# Patient Record
Sex: Female | Born: 1950 | Race: White | Hispanic: No | Smoking: Former smoker
Health system: Southern US, Community
[De-identification: ages and names within clinical notes are randomized; demographics above are authoritative.]

## PROBLEM LIST (undated history)

## (undated) DIAGNOSIS — J45909 Unspecified asthma, uncomplicated: Secondary | ICD-10-CM

## (undated) DIAGNOSIS — G8929 Other chronic pain: Secondary | ICD-10-CM

## (undated) DIAGNOSIS — M810 Age-related osteoporosis without current pathological fracture: Secondary | ICD-10-CM

## (undated) DIAGNOSIS — Z720 Tobacco use: Secondary | ICD-10-CM

## (undated) DIAGNOSIS — IMO0002 Reserved for concepts with insufficient information to code with codable children: Secondary | ICD-10-CM

## (undated) DIAGNOSIS — M542 Cervicalgia: Secondary | ICD-10-CM

## (undated) DIAGNOSIS — I10 Essential (primary) hypertension: Secondary | ICD-10-CM

## (undated) DIAGNOSIS — E278 Other specified disorders of adrenal gland: Secondary | ICD-10-CM

## (undated) DIAGNOSIS — J449 Chronic obstructive pulmonary disease, unspecified: Secondary | ICD-10-CM

## (undated) DIAGNOSIS — Z9981 Dependence on supplemental oxygen: Secondary | ICD-10-CM

## (undated) DIAGNOSIS — R0600 Dyspnea, unspecified: Secondary | ICD-10-CM

## (undated) DIAGNOSIS — K5010 Crohn's disease of large intestine without complications: Secondary | ICD-10-CM

## (undated) HISTORY — PX: CHOLECYSTECTOMY: SHX55

## (undated) HISTORY — PX: EYE SURGERY: SHX253

## (undated) HISTORY — PX: APPENDECTOMY: SHX54

## (undated) HISTORY — DX: Crohn's disease of large intestine without complications: K50.10

## (undated) HISTORY — DX: Other specified disorders of adrenal gland: E27.8

---

## 2004-01-12 ENCOUNTER — Emergency Department (HOSPITAL_COMMUNITY): Admission: EM | Admit: 2004-01-12 | Discharge: 2004-01-12 | Payer: Self-pay | Admitting: Emergency Medicine

## 2004-01-22 ENCOUNTER — Encounter (HOSPITAL_COMMUNITY): Admission: RE | Admit: 2004-01-22 | Discharge: 2004-02-21 | Payer: Self-pay | Admitting: Orthopaedic Surgery

## 2004-02-11 ENCOUNTER — Ambulatory Visit (HOSPITAL_COMMUNITY): Admission: RE | Admit: 2004-02-11 | Discharge: 2004-02-11 | Payer: Self-pay | Admitting: Orthopaedic Surgery

## 2004-04-12 ENCOUNTER — Ambulatory Visit: Payer: Self-pay | Admitting: Ophthalmology

## 2004-05-24 ENCOUNTER — Ambulatory Visit: Payer: Self-pay | Admitting: Ophthalmology

## 2005-05-05 ENCOUNTER — Emergency Department (HOSPITAL_COMMUNITY): Admission: EM | Admit: 2005-05-05 | Discharge: 2005-05-05 | Payer: Self-pay | Admitting: Emergency Medicine

## 2006-01-09 ENCOUNTER — Emergency Department (HOSPITAL_COMMUNITY): Admission: EM | Admit: 2006-01-09 | Discharge: 2006-01-09 | Payer: Self-pay | Admitting: Emergency Medicine

## 2008-09-27 ENCOUNTER — Emergency Department (HOSPITAL_COMMUNITY): Admission: EM | Admit: 2008-09-27 | Discharge: 2008-09-27 | Payer: Self-pay | Admitting: Emergency Medicine

## 2011-07-13 ENCOUNTER — Observation Stay (HOSPITAL_COMMUNITY)
Admission: EM | Admit: 2011-07-13 | Discharge: 2011-07-14 | Disposition: A | Discharge status: Home / Self Care | Payer: Self-pay | Attending: General Surgery | Admitting: General Surgery

## 2011-07-13 ENCOUNTER — Emergency Department (HOSPITAL_COMMUNITY): Payer: Self-pay

## 2011-07-13 ENCOUNTER — Encounter (HOSPITAL_COMMUNITY): Payer: Self-pay | Admitting: *Deleted

## 2011-07-13 DIAGNOSIS — N9489 Other specified conditions associated with female genital organs and menstrual cycle: Secondary | ICD-10-CM | POA: Insufficient documentation

## 2011-07-13 DIAGNOSIS — R220 Localized swelling, mass and lump, head: Secondary | ICD-10-CM | POA: Insufficient documentation

## 2011-07-13 DIAGNOSIS — K449 Diaphragmatic hernia without obstruction or gangrene: Secondary | ICD-10-CM | POA: Insufficient documentation

## 2011-07-13 DIAGNOSIS — R05 Cough: Secondary | ICD-10-CM | POA: Insufficient documentation

## 2011-07-13 DIAGNOSIS — I700 Atherosclerosis of aorta: Secondary | ICD-10-CM | POA: Insufficient documentation

## 2011-07-13 DIAGNOSIS — R0602 Shortness of breath: Secondary | ICD-10-CM | POA: Insufficient documentation

## 2011-07-13 DIAGNOSIS — R110 Nausea: Secondary | ICD-10-CM | POA: Insufficient documentation

## 2011-07-13 DIAGNOSIS — J4489 Other specified chronic obstructive pulmonary disease: Secondary | ICD-10-CM | POA: Insufficient documentation

## 2011-07-13 DIAGNOSIS — R059 Cough, unspecified: Secondary | ICD-10-CM | POA: Insufficient documentation

## 2011-07-13 DIAGNOSIS — E278 Other specified disorders of adrenal gland: Secondary | ICD-10-CM | POA: Insufficient documentation

## 2011-07-13 DIAGNOSIS — Z01812 Encounter for preprocedural laboratory examination: Secondary | ICD-10-CM | POA: Insufficient documentation

## 2011-07-13 DIAGNOSIS — J449 Chronic obstructive pulmonary disease, unspecified: Secondary | ICD-10-CM | POA: Insufficient documentation

## 2011-07-13 DIAGNOSIS — K3580 Unspecified acute appendicitis: Principal | ICD-10-CM | POA: Insufficient documentation

## 2011-07-13 DIAGNOSIS — Z79899 Other long term (current) drug therapy: Secondary | ICD-10-CM | POA: Insufficient documentation

## 2011-07-13 DIAGNOSIS — R1031 Right lower quadrant pain: Secondary | ICD-10-CM | POA: Insufficient documentation

## 2011-07-13 DIAGNOSIS — R509 Fever, unspecified: Secondary | ICD-10-CM | POA: Insufficient documentation

## 2011-07-13 HISTORY — DX: Chronic obstructive pulmonary disease, unspecified: J44.9

## 2011-07-13 LAB — DIFFERENTIAL
Basophils Relative: 0 % (ref 0–1)
Monocytes Absolute: 1.4 10*3/uL — ABNORMAL HIGH (ref 0.1–1.0)
Neutro Abs: 13.9 10*3/uL — ABNORMAL HIGH (ref 1.7–7.7)
Neutrophils Relative %: 82 % — ABNORMAL HIGH (ref 43–77)

## 2011-07-13 LAB — CBC
MCH: 29.9 pg (ref 26.0–34.0)
MCHC: 34.5 g/dL (ref 30.0–36.0)
MCV: 86.6 fL (ref 78.0–100.0)
Platelets: 379 10*3/uL (ref 150–400)
RBC: 5.52 MIL/uL — ABNORMAL HIGH (ref 3.87–5.11)
RDW: 13.2 % (ref 11.5–15.5)
WBC: 17 10*3/uL — ABNORMAL HIGH (ref 4.0–10.5)

## 2011-07-13 LAB — COMPREHENSIVE METABOLIC PANEL
ALT: 11 U/L (ref 0–35)
AST: 14 U/L (ref 0–37)
Albumin: 4.6 g/dL (ref 3.5–5.2)
Alkaline Phosphatase: 113 U/L (ref 39–117)
Chloride: 99 mEq/L (ref 96–112)
GFR calc Af Amer: >90 mL/min (ref >90)
GFR calc non Af Amer: >90 mL/min (ref >90)
Total Bilirubin: 0.9 mg/dL (ref 0.3–1.2)
Total Protein: 8.4 g/dL — ABNORMAL HIGH (ref 6.0–8.3)

## 2011-07-13 LAB — URINE MICROSCOPIC-ADD ON

## 2011-07-13 LAB — URINALYSIS, ROUTINE W REFLEX MICROSCOPIC
Ketones, ur: NEGATIVE mg/dL
Leukocytes, UA: NEGATIVE
Nitrite: NEGATIVE
Urobilinogen, UA: 0.2 mg/dL (ref 0.0–1.0)

## 2011-07-13 LAB — LIPASE, BLOOD: Lipase: 18 U/L (ref 11–59)

## 2011-07-13 MED ORDER — HYDROMORPHONE HCL PF 1 MG/ML IJ SOLN
1.0000 mg | INTRAMUSCULAR | Status: DC | PRN
  Administered 2011-07-14: 1 mg via INTRAVENOUS
  Filled 2011-07-13: qty 1

## 2011-07-13 MED ORDER — PANTOPRAZOLE SODIUM 40 MG IV SOLR: 40.0000 mg | Freq: Every day | INTRAVENOUS | Status: DC

## 2011-07-13 MED ORDER — LACTATED RINGERS IV SOLN
INTRAVENOUS | Status: DC
  Administered 2011-07-14: 01:00:00 via INTRAVENOUS

## 2011-07-13 MED ORDER — FENTANYL CITRATE 0.05 MG/ML IJ SOLN
50.0000 ug | Freq: Once | INTRAMUSCULAR | Status: AC
  Administered 2011-07-13: 50 ug via INTRAVENOUS
  Filled 2011-07-13: qty 2

## 2011-07-13 MED ORDER — IOHEXOL 300 MG/ML  SOLN
100.0000 mL | Freq: Once | INTRAMUSCULAR | Status: AC | PRN
  Administered 2011-07-13: 100 mL via INTRAVENOUS

## 2011-07-13 MED ORDER — ERTAPENEM SODIUM 1 G IJ SOLR
1.0000 g | Freq: Once | INTRAMUSCULAR | Status: AC
  Administered 2011-07-13: 1 g via INTRAVENOUS
  Filled 2011-07-13: qty 1

## 2011-07-13 MED ORDER — ONDANSETRON HCL 4 MG/2ML IJ SOLN
4.0000 mg | Freq: Four times a day (QID) | INTRAMUSCULAR | Status: DC | PRN
  Administered 2011-07-13: 4 mg via INTRAVENOUS
  Filled 2011-07-13: qty 2

## 2011-07-13 MED ORDER — SODIUM CHLORIDE 0.9 % IV BOLUS (SEPSIS): 500.0000 mL | Freq: Once | INTRAVENOUS | Status: DC

## 2011-07-13 MED ORDER — ONDANSETRON HCL 4 MG/2ML IJ SOLN
4.0000 mg | Freq: Once | INTRAMUSCULAR | Status: AC
  Administered 2011-07-13: 4 mg via INTRAVENOUS
  Filled 2011-07-13: qty 2

## 2011-07-13 MED ORDER — IOHEXOL 300 MG/ML  SOLN
40.0000 mL | Freq: Once | INTRAMUSCULAR | Status: AC | PRN
  Administered 2011-07-13: 40 mL via ORAL

## 2011-07-13 MED ORDER — SODIUM CHLORIDE 0.9 % IV BOLUS (SEPSIS)
500.0000 mL | Freq: Once | INTRAVENOUS | Status: AC
  Administered 2011-07-13: 500 mL via INTRAVENOUS

## 2011-07-13 MED ORDER — ENOXAPARIN SODIUM 40 MG/0.4ML ~~LOC~~ SOLN: 40.0000 mg | SUBCUTANEOUS | Status: DC

## 2011-07-13 MED ORDER — SODIUM CHLORIDE 0.9 % IV SOLN
INTRAVENOUS | Status: DC
  Administered 2011-07-13: 22:00:00 via INTRAVENOUS

## 2011-07-13 NOTE — ED Notes (Signed)
NO vomiting or diarrhea, abd pain

## 2011-07-13 NOTE — ED Notes (Signed)
Pt medicated as ordered for pain and nausea.

## 2011-07-13 NOTE — ED Notes (Signed)
Pt to CT via stretcher

## 2011-07-13 NOTE — ED Provider Notes (Addendum)
History     CSN: [id number]  Arrival date & time 07/13/11  1919   First MD Initiated Contact with Patient 07/13/11 1929      Chief Complaint  Patient presents with  . Abdominal Pain    (Consider location/radiation/quality/duration/timing/severity/associated sxs/prior treatment) Patient is a 61 y.o. female presenting with abdominal pain. The history is provided by the patient.  Abdominal Pain The primary symptoms of the illness include abdominal pain. The primary symptoms of the illness do not include shortness of breath, nausea, vomiting or diarrhea.  Symptoms associated with the illness do not include back pain.   patient's had abdominal pain for the last few days. Started sort of diffuse left lower quadrant now is more severe in the right lower quadrant. No fevers. No nausea vomiting or diarrhea. No previous surgeries. No change in the pain with bowel movements. No blood in stool. No dysuria. She's not had pains at this before. She had a half a piece of bread at around 4 PM today  Past Medical History  Diagnosis Date  . Diverticula of intestine   . COPD (chronic obstructive pulmonary disease)     History reviewed. No pertinent past surgical history.  History reviewed. No pertinent family history.  History  Substance Use Topics  . Smoking status: Current Everyday Smoker  . Smokeless tobacco: Not on file  . Alcohol Use: No    OB History    Grav Para Term Preterm Abortions TAB SAB Ect Mult Living                  Review of Systems  Constitutional: Negative for activity change and appetite change.  HENT: Negative for neck stiffness.   Eyes: Negative for pain.  Respiratory: Negative for chest tightness and shortness of breath.   Cardiovascular: Negative for chest pain and leg swelling.  Gastrointestinal: Positive for abdominal pain. Negative for nausea, vomiting and diarrhea.  Genitourinary: Negative for flank pain.  Musculoskeletal: Negative for back pain.  Skin:  Negative for rash.  Neurological: Negative for weakness, numbness and headaches.  Psychiatric/Behavioral: Negative for behavioral problems.    Allergies  Sulfa antibiotics  Home Medications   Current Outpatient Rx  Name Route Sig Dispense Refill  . GOODY HEADACHE PO Oral Take 1.5 packets by mouth as needed. For pain      BP 142/81  Pulse 102  Temp(Src) 98.1 F (36.7 C) (Oral)  Resp 20  Ht 5' 2"  (1.575 m)  Wt 105 lb (47.628 kg)  BMI 19.20 kg/m2  SpO2 94%  Physical Exam  Nursing note and vitals reviewed. Constitutional: She is oriented to person, place, and time. She appears well-developed and well-nourished.  HENT:  Head: Normocephalic and atraumatic.  Eyes: EOM are normal. Pupils are equal, round, and reactive to light.  Neck: Normal range of motion. Neck supple.  Cardiovascular: Regular rhythm and normal heart sounds.   No murmur heard.      Tachycardic  Pulmonary/Chest: Effort normal and breath sounds normal. No respiratory distress. She has no wheezes. She has no rales.  Abdominal: Soft. Bowel sounds are normal. She exhibits no distension. There is tenderness. There is guarding. There is no rebound.       Moderate right lower quadrant tenderness with some guarding. No hernias palpated  Musculoskeletal: Normal range of motion.  Neurological: She is alert and oriented to person, place, and time. No cranial nerve deficit.  Skin: Skin is warm and dry.  Psychiatric: She has a normal mood and affect.  Her speech is normal.    ED Course  Procedures (including critical care time)  Labs Reviewed  CBC - Abnormal; Notable for the following:    WBC 17.0 (*)    RBC 5.52 (*)    Hemoglobin 16.5 (*)    HCT 47.8 (*)    All other components within normal limits  DIFFERENTIAL - Abnormal; Notable for the following:    Neutrophils Relative 82 (*)    Neutro Abs 13.9 (*)    Lymphocytes Relative 10 (*)    Monocytes Absolute 1.4 (*)    All other components within normal limits    COMPREHENSIVE METABOLIC PANEL - Abnormal; Notable for the following:    Glucose, Bld 127 (*)    Calcium 10.8 (*)    Total Protein 8.4 (*)    All other components within normal limits  URINALYSIS, ROUTINE W REFLEX MICROSCOPIC - Abnormal; Notable for the following:    Specific Gravity, Urine <1.005 (*)    Hgb urine dipstick TRACE (*)    All other components within normal limits  URINE MICROSCOPIC-ADD ON - Abnormal; Notable for the following:    Squamous Epithelial / LPF FEW (*)    Casts HYALINE CASTS (*)    All other components within normal limits  LIPASE, BLOOD   No results found.   No diagnosis found.    MDM  Severe right lower quadrant abdominal pain. History of diverticulosis. Patient has not had an appendectomy. She does have a leukocytosis of 17. Urinalysis does not show infection. CT scan is pending at this time.        Jasper Riling. Alvino Chapel, MD 07/13/11 2125  Ct showed acute appy. Also thickening on cecum. Also Left adrenal mass will need to be followed. Will be admitted.  Jasper Riling. Alvino Chapel, MD 07/13/11 2230

## 2011-07-14 ENCOUNTER — Encounter (HOSPITAL_COMMUNITY): Payer: Self-pay | Admitting: Anesthesiology

## 2011-07-14 ENCOUNTER — Inpatient Hospital Stay (HOSPITAL_COMMUNITY): Payer: Self-pay | Admitting: Anesthesiology

## 2011-07-14 ENCOUNTER — Encounter (HOSPITAL_COMMUNITY): Payer: Self-pay | Admitting: *Deleted

## 2011-07-14 ENCOUNTER — Other Ambulatory Visit: Payer: Self-pay | Admitting: General Surgery

## 2011-07-14 ENCOUNTER — Encounter (HOSPITAL_COMMUNITY)
Admission: EM | Disposition: A | Discharge status: Home / Self Care | Payer: Self-pay | Source: Home / Self Care | Attending: Emergency Medicine

## 2011-07-14 HISTORY — PX: LAPAROSCOPIC APPENDECTOMY: SHX408

## 2011-07-14 LAB — CBC
MCHC: 33.3 g/dL (ref 30.0–36.0)
Platelets: 304 10*3/uL (ref 150–400)
RDW: 13.3 % (ref 11.5–15.5)
WBC: 13.9 10*3/uL — ABNORMAL HIGH (ref 4.0–10.5)

## 2011-07-14 LAB — BASIC METABOLIC PANEL
BUN: 5 mg/dL — ABNORMAL LOW (ref 6–23)
CO2: 25 mEq/L (ref 19–32)
Calcium: 9 mg/dL (ref 8.4–10.5)
Chloride: 105 mEq/L (ref 96–112)
Creatinine, Ser: 0.54 mg/dL (ref 0.50–1.10)
Glucose, Bld: 122 mg/dL — ABNORMAL HIGH (ref 70–99)
Potassium: 3.6 mEq/L (ref 3.5–5.1)

## 2011-07-14 LAB — SURGICAL PCR SCREEN: Staphylococcus aureus: NEGATIVE

## 2011-07-14 SURGERY — APPENDECTOMY, LAPAROSCOPIC
Anesthesia: General | Wound class: Contaminated

## 2011-07-14 MED ORDER — FENTANYL CITRATE 0.05 MG/ML IJ SOLN
INTRAMUSCULAR | Status: DC | PRN
  Administered 2011-07-14 (×2): 50 ug via INTRAVENOUS

## 2011-07-14 MED ORDER — NEOSTIGMINE METHYLSULFATE 1 MG/ML IJ SOLN
INTRAMUSCULAR | Status: AC
  Filled 2011-07-14: qty 10

## 2011-07-14 MED ORDER — ONDANSETRON HCL 4 MG/2ML IJ SOLN
INTRAMUSCULAR | Status: AC
  Administered 2011-07-14: 4 mg via INTRAVENOUS
  Filled 2011-07-14: qty 2

## 2011-07-14 MED ORDER — FENTANYL CITRATE 0.05 MG/ML IJ SOLN: 25.0000 ug | INTRAMUSCULAR | Status: DC | PRN

## 2011-07-14 MED ORDER — ROCURONIUM BROMIDE 50 MG/5ML IV SOLN
INTRAVENOUS | Status: AC
  Filled 2011-07-14: qty 1

## 2011-07-14 MED ORDER — GLYCOPYRROLATE 0.2 MG/ML IJ SOLN
INTRAMUSCULAR | Status: AC
  Administered 2011-07-14: 0.2 mg via INTRAVENOUS
  Filled 2011-07-14: qty 1

## 2011-07-14 MED ORDER — SODIUM CHLORIDE 0.9 % IR SOLN
Status: DC | PRN
  Administered 2011-07-14: 1000 mL

## 2011-07-14 MED ORDER — LIDOCAINE HCL 1 % IJ SOLN
INTRAMUSCULAR | Status: DC | PRN
  Administered 2011-07-14: 20 mg via INTRADERMAL

## 2011-07-14 MED ORDER — PHENYLEPHRINE HCL 10 MG/ML IJ SOLN
INTRAMUSCULAR | Status: DC | PRN
  Administered 2011-07-14: 50 ug via INTRAVENOUS

## 2011-07-14 MED ORDER — BUPIVACAINE HCL (PF) 0.5 % IJ SOLN
INTRAMUSCULAR | Status: AC
  Filled 2011-07-14: qty 30

## 2011-07-14 MED ORDER — GLYCOPYRROLATE 0.2 MG/ML IJ SOLN
INTRAMUSCULAR | Status: DC | PRN
  Administered 2011-07-14: .4 mg via INTRAVENOUS

## 2011-07-14 MED ORDER — ONDANSETRON HCL 4 MG/2ML IJ SOLN: 4.0000 mg | Freq: Once | INTRAMUSCULAR | Status: DC | PRN

## 2011-07-14 MED ORDER — ONDANSETRON HCL 4 MG/2ML IJ SOLN
4.0000 mg | Freq: Once | INTRAMUSCULAR | Status: AC
  Administered 2011-07-14: 4 mg via INTRAVENOUS

## 2011-07-14 MED ORDER — PROPOFOL 10 MG/ML IV EMUL
INTRAVENOUS | Status: AC
  Filled 2011-07-14: qty 20

## 2011-07-14 MED ORDER — LIDOCAINE HCL (PF) 1 % IJ SOLN
INTRAMUSCULAR | Status: AC
  Filled 2011-07-14: qty 5

## 2011-07-14 MED ORDER — ACETAMINOPHEN 325 MG PO TABS: 325.0000 mg | ORAL_TABLET | ORAL | Status: DC | PRN

## 2011-07-14 MED ORDER — GLYCOPYRROLATE 0.2 MG/ML IJ SOLN
0.2000 mg | Freq: Once | INTRAMUSCULAR | Status: AC
  Administered 2011-07-14: 0.2 mg via INTRAVENOUS

## 2011-07-14 MED ORDER — ENOXAPARIN SODIUM 40 MG/0.4ML ~~LOC~~ SOLN
SUBCUTANEOUS | Status: AC
  Administered 2011-07-14: 40 mg via SUBCUTANEOUS
  Filled 2011-07-14: qty 0.4

## 2011-07-14 MED ORDER — ENOXAPARIN SODIUM 40 MG/0.4ML ~~LOC~~ SOLN
40.0000 mg | Freq: Once | SUBCUTANEOUS | Status: AC
  Administered 2011-07-14: 40 mg via SUBCUTANEOUS

## 2011-07-14 MED ORDER — NEOSTIGMINE METHYLSULFATE 1 MG/ML IJ SOLN
INTRAMUSCULAR | Status: DC | PRN
  Administered 2011-07-14: 2 mg via INTRAVENOUS

## 2011-07-14 MED ORDER — GLYCOPYRROLATE 0.2 MG/ML IJ SOLN
INTRAMUSCULAR | Status: AC
  Filled 2011-07-14: qty 1

## 2011-07-14 MED ORDER — SODIUM CHLORIDE 0.9 % IV SOLN
1.0000 g | INTRAVENOUS | Status: DC
  Filled 2011-07-14: qty 1

## 2011-07-14 MED ORDER — PROPOFOL 10 MG/ML IV BOLUS
INTRAVENOUS | Status: DC | PRN
  Administered 2011-07-14: 100 mg via INTRAVENOUS

## 2011-07-14 MED ORDER — MIDAZOLAM HCL 2 MG/2ML IJ SOLN
INTRAMUSCULAR | Status: AC
  Administered 2011-07-14: 2 mg via INTRAVENOUS
  Filled 2011-07-14: qty 2

## 2011-07-14 MED ORDER — LACTATED RINGERS IV SOLN: INTRAVENOUS | Status: DC

## 2011-07-14 MED ORDER — HYDROMORPHONE HCL PF 1 MG/ML IJ SOLN: 1.0000 mg | INTRAMUSCULAR | Status: DC | PRN

## 2011-07-14 MED ORDER — HYDROCODONE-ACETAMINOPHEN 5-325 MG PO TABS: 1.0000 | ORAL_TABLET | ORAL | Status: AC | PRN

## 2011-07-14 MED ORDER — ROCURONIUM BROMIDE 100 MG/10ML IV SOLN
INTRAVENOUS | Status: DC | PRN
  Administered 2011-07-14: 5 mg via INTRAVENOUS
  Administered 2011-07-14: 15 mg via INTRAVENOUS

## 2011-07-14 MED ORDER — HYDROCODONE-ACETAMINOPHEN 5-325 MG PO TABS
1.0000 | ORAL_TABLET | ORAL | Status: DC | PRN
  Administered 2011-07-14: 1 via ORAL
  Filled 2011-07-14: qty 1

## 2011-07-14 MED ORDER — MIDAZOLAM HCL 2 MG/2ML IJ SOLN
1.0000 mg | INTRAMUSCULAR | Status: DC | PRN
  Administered 2011-07-14: 2 mg via INTRAVENOUS

## 2011-07-14 MED ORDER — FENTANYL CITRATE 0.05 MG/ML IJ SOLN
INTRAMUSCULAR | Status: AC
  Filled 2011-07-14: qty 5

## 2011-07-14 MED ORDER — SODIUM CHLORIDE 0.9 % IV SOLN
INTRAVENOUS | Status: AC
  Filled 2011-07-14: qty 1

## 2011-07-14 MED ORDER — BUPIVACAINE HCL 0.5 % IJ SOLN
INTRAMUSCULAR | Status: DC | PRN
  Administered 2011-07-14: 10 mL

## 2011-07-14 SURGICAL SUPPLY — 42 items
BAG HAMPER (MISCELLANEOUS) ×2 IMPLANT
BENZOIN TINCTURE PRP APPL 2/3 (GAUZE/BANDAGES/DRESSINGS) ×2 IMPLANT
CLOTH BEACON ORANGE TIMEOUT ST (SAFETY) ×2 IMPLANT
COVER LIGHT HANDLE STERIS (MISCELLANEOUS) ×4 IMPLANT
CUTTER ENDO LINEAR 45M (STAPLE) ×2 IMPLANT
CUTTER LINEAR ENDO 35 ETS TH (STAPLE) ×2 IMPLANT
DECANTER SPIKE VIAL GLASS SM (MISCELLANEOUS) ×2 IMPLANT
DEVICE TROCAR PUNCTURE CLOSURE (ENDOMECHANICALS) ×2 IMPLANT
DISSECTOR BLUNT TIP ENDO 5MM (MISCELLANEOUS) IMPLANT
DURAPREP 26ML APPLICATOR (WOUND CARE) ×2 IMPLANT
ELECT REM PT RETURN 9FT ADLT (ELECTROSURGICAL) ×2
ELECTRODE REM PT RTRN 9FT ADLT (ELECTROSURGICAL) ×1 IMPLANT
FILTER SMOKE EVAC LAPAROSHD (FILTER) ×2 IMPLANT
FORMALIN 10 PREFIL 120ML (MISCELLANEOUS) ×2 IMPLANT
GLOVE BIOGEL PI IND STRL 7.5 (GLOVE) ×1 IMPLANT
GLOVE BIOGEL PI INDICATOR 7.5 (GLOVE) ×1
GLOVE ECLIPSE 6.5 STRL STRAW (GLOVE) ×6 IMPLANT
GLOVE ECLIPSE 7.0 STRL STRAW (GLOVE) ×2 IMPLANT
GLOVE INDICATOR 7.0 STRL GRN (GLOVE) ×6 IMPLANT
GOWN STRL REIN XL XLG (GOWN DISPOSABLE) ×4 IMPLANT
INST SET LAPROSCOPIC AP (KITS) ×2 IMPLANT
KIT ROOM TURNOVER APOR (KITS) ×2 IMPLANT
LIGASURE 5MM LAPAROSCOPIC (INSTRUMENTS) ×2 IMPLANT
MANIFOLD NEPTUNE II (INSTRUMENTS) ×2 IMPLANT
NEEDLE INSUFFLATION 14GA 120MM (NEEDLE) ×2 IMPLANT
NS IRRIG 1000ML POUR BTL (IV SOLUTION) ×2 IMPLANT
PACK LAP CHOLE LZT030E (CUSTOM PROCEDURE TRAY) ×2 IMPLANT
PAD ARMBOARD 7.5X6 YLW CONV (MISCELLANEOUS) ×2 IMPLANT
POUCH SPECIMEN RETRIEVAL 10MM (ENDOMECHANICALS) ×2 IMPLANT
RELOAD 45 VASCULAR/THIN (ENDOMECHANICALS) IMPLANT
RELOAD STAPLE TA45 3.5 REG BLU (ENDOMECHANICALS) IMPLANT
SET BASIN LINEN APH (SET/KITS/TRAYS/PACK) ×2 IMPLANT
SET TUBE IRRIG SUCTION NO TIP (IRRIGATION / IRRIGATOR) IMPLANT
SLEEVE Z-THREAD 5X100MM (TROCAR) ×2 IMPLANT
STRIP CLOSURE SKIN 1/2X4 (GAUZE/BANDAGES/DRESSINGS) ×2 IMPLANT
SUT MNCRL AB 4-0 PS2 18 (SUTURE) ×2 IMPLANT
SUT VIC AB 2-0 CT2 27 (SUTURE) ×4 IMPLANT
TRAY FOLEY CATH 14FR (SET/KITS/TRAYS/PACK) ×2 IMPLANT
TROCAR Z-THRD FIOS HNDL 11X100 (TROCAR) ×2 IMPLANT
TROCAR Z-THREAD FIOS 5X100MM (TROCAR) ×2 IMPLANT
TROCAR Z-THREAD SLEEVE 11X100 (TROCAR) ×2 IMPLANT
WARMER LAPAROSCOPE (MISCELLANEOUS) ×2 IMPLANT

## 2011-07-14 NOTE — Op Note (Signed)
Patient:  Stacey Nash  DOB:  [birth year]  MRN:  [id number]   Preop Diagnosis:  Acute appendicitis  Postop Diagnosis:  The same  Procedure:  Laparoscopic appendectomy  Surgeon:  Dr. Chelsea Primus  Anes:  General endotracheal, 0.5% Sensorcaine plain for local  Indications:  Patient is a 61 year old female presented to [facility] emergency department with right lower quadrant abdominal pain. Workup and evaluation was consistent for acute appendicitis. Risk benefits alternatives of a laparoscopic possible open appendectomy were discussed at length the patient including but not limited to risk of bleeding, infection, appendiceal stump leak, intraoperative cardiac and pulmonary events. Patient's questions and concerns were addressed the patient was consented for the planned procedure.  Procedure note:  Patient was taken to the or was placed in a supine position on the or table time the general anesthetic was administered. At this point patient was endotracheally intubated by the nurse anesthetist. A Foley catheter was placed in standard sterile fashion by the operative staff. Her abdomen was prepped with DuraPrep solution and draped in standard fashion. A stab incision was created supraumbilically with 11 blade scalpel with additional dissection down through subcuticular tissue carried out using a Coker clamp. The anterior abdominal fascia was grasped and lifted anteriorly. A Veress needle was inserted saline drop test is utilized confirm intraperitoneal placement and then pneumoperitoneum was initiated. Once sufficient pneumoperitoneum was obtained a 12 mm trocar was inserted over laparoscope allowing visualization the trocar entering into the peritoneal cavity. At this point the inner cannulas removed the laparoscope was reinserted there is no evidence of any trocar or Veress needle placement injury. The remaining trochars replaced this time with 11 mm in the left lateral abdominal wall. A 5 mm  trocar in the suprapubic region. Patient's placed into a Trendelenburg left lateral decubitus position. The cecum was identified a tiny or fall down to the base of the appendix. The appendix grasped and a window was created between the appendix and mesoappendix a IT consultant. The mesoappendix was divided using the LigaSure bipolar device. The base of the appendix then divided using a Endo GIA 35 vascular stapler load. At this point the appendix was free it was placed into an Endo Catch bag which is placed up into the right upper quadrant. Inspection of the staple line demonstrated excellent hemostasis and no evidence of any leaking. Similarly the mesoappendix inspected there is no evidence of any bleeding. At this time attention was turned to closure.  Using an Endo Close suture passing device a 2-0 Vicryl sutures passed both the 12 and 11 mm trocar sites. With the sutures in place the appendix was retrieved through the umbilical trocar site and intact Endo Catch bag. The appendix was placed in the back table and sent as a perm specimen to pathology. At this point the pneumoperitoneum was evacuated. The trochars were removed. The Vicryl sutures were secured. The local anesthetic was instilled. A 4-0 Monocryl was utilized to reapproximate the skin edges at all 3 trocar sites. The skin was washed dried moist dry towel. Benzoin is applied around incision. Half-inch are Steri-Strips placed. The drapes removed the patient was allowed to vaginal site was transferred the postanesthetic care unit in stable condition. At the conclusion of procedure all instrument, sponge, needle counts are correct. Patient tolerated procedure extremely well.  Complications:  None apparent  EBL:  Scant  Specimen:  Appendix

## 2011-07-14 NOTE — Anesthesia Procedure Notes (Signed)
Procedure Name: Intubation Date/Time: 07/14/2011 7:47 AM Performed by: Tressie Stalker Pre-anesthesia Checklist: Patient identified, Patient being monitored, Timeout performed, Emergency Drugs available and Suction available Patient Re-evaluated:Patient Re-evaluated prior to inductionOxygen Delivery Method: Circle System Utilized Preoxygenation: Pre-oxygenation with 100% oxygen Intubation Type: IV induction, Rapid sequence and Cricoid Pressure applied Laryngoscope Size: Mac and 3 Grade View: Grade II Tube type: Oral Tube size: 7.0 mm Number of attempts: 1 Airway Equipment and Method: stylet Placement Confirmation: ETT inserted through vocal cords under direct vision,  positive ETCO2 and breath sounds checked- equal and bilateral Secured at: 21 cm Tube secured with: Tape Dental Injury: Teeth and Oropharynx as per pre-operative assessment

## 2011-07-14 NOTE — Anesthesia Preprocedure Evaluation (Signed)
Anesthesia Evaluation  Patient identified by MRN, date of birth, ID band Patient awake    Reviewed: Allergy & Precautions, H&P , NPO status , Patient's Chart, lab work & pertinent test results  Airway Mallampati: II TM Distance: <3 FB Neck ROM: Full    Dental  (+) Edentulous Upper   Pulmonary COPD   Pulmonary exam normal       Cardiovascular neg cardio ROS Regular Normal    Neuro/Psych Negative Neurological ROS  Negative Psych ROS   GI/Hepatic negative GI ROS, Neg liver ROS,   Endo/Other  Negative Endocrine ROS  Renal/GU negative Renal ROS     Musculoskeletal negative musculoskeletal ROS (+)   Abdominal Normal abdominal exam  (+)   Peds  Hematology negative hematology ROS (+)   Anesthesia Other Findings   Reproductive/Obstetrics                           Anesthesia Physical Anesthesia Plan  ASA: II  Anesthesia Plan: General   Post-op Pain Management:    Induction: Intravenous, Rapid sequence and Cricoid pressure planned  Airway Management Planned: Oral ETT  Additional Equipment:   Intra-op Plan:   Post-operative Plan: Extubation in OR  Informed Consent: I have reviewed the patients History and Physical, chart, labs and discussed the procedure including the risks, benefits and alternatives for the proposed anesthesia with the patient or authorized representative who has indicated his/her understanding and acceptance.     Plan Discussed with: CRNA  Anesthesia Plan Comments:         Anesthesia Quick Evaluation

## 2011-07-14 NOTE — Progress Notes (Signed)
Patient discharged home via family; Pt given and explained discharge instructions, prescriptions, carenotes; stated understanding and denied questions; pts IV removed without problems; pt stable at time of discharge

## 2011-07-14 NOTE — Progress Notes (Signed)
Awake. Denies pain. Sips ginger-ale given. Tolerated well.

## 2011-07-14 NOTE — Transfer of Care (Signed)
Immediate Anesthesia Transfer of Care Note  Patient: Stacey Nash  Procedure(s) Performed: Procedure(s) (LRB): APPENDECTOMY LAPAROSCOPIC (N/A)  Patient Location: PACU  Anesthesia Type: General  Level of Consciousness: sedated  Airway & Oxygen Therapy: Patient Spontanous Breathing and Patient connected to face mask oxygen  Post-op Assessment: Report given to PACU RN and Post -op Vital signs reviewed and stable  Post vital signs: Reviewed and stable  Complications: No apparent anesthesia complications

## 2011-07-14 NOTE — Progress Notes (Signed)
Awakens easily to name. Denies pain. Returns to sleep. O2 continued.

## 2011-07-14 NOTE — H&P (Signed)
Stacey Nash is an 61 y.o. female.   Chief Complaint: Right lower quadrant abdominal pain. HPI: Patient presented to [facility] emergency department with approximately 48 hours of increasing right lower quadrant abdominal pain. Pain has localized to the right lower quadrant. Pain is worse with palpation and movement. Appetite has diminished. She has had some associated nausea. Associated subjective fevers and chills. No change in bowel movements. No melena no hematochezia. No change in urination. No similar symptomatology in the past. No unusual exposures or recent travel. No sick contacts.  Past Medical History  Diagnosis Date  . Diverticula of intestine   . COPD (chronic obstructive pulmonary disease)     Past Surgical History  Procedure Date  . Eye surgery     History reviewed. No pertinent family history. Social History:  reports that she has been smoking.  She does not have any smokeless tobacco history on file. She reports that she does not drink alcohol or use illicit drugs.  Allergies:  Allergies  Allergen Reactions  . Sulfa Antibiotics     Medications Prior to Admission  Medication Dose Route Frequency Provider Last Rate Last Dose  . 0.9 %  sodium chloride infusion   Intravenous Continuous Jasper Riling. Alvino Chapel, MD 125 mL/hr at 07/13/11 2130    . enoxaparin (LOVENOX) injection 40 mg  40 mg Subcutaneous Q24H Donato Heinz, MD      . enoxaparin (LOVENOX) injection 40 mg  40 mg Subcutaneous Once Donato Heinz, MD      . ertapenem [facility]) 1 g in sodium chloride 0.9 % 50 mL IVPB  1 g Intravenous Once Jasper Riling. Pickering, MD 100 mL/hr at 07/13/11 2249 1 g at 07/13/11 2249  . ertapenem (INVANZ) 1 g in sodium chloride 0.9 % 50 mL IVPB  1 g Intravenous 60 min Pre-Op Donato Heinz, MD      . fentaNYL (SUBLIMAZE) injection 50 mcg  50 mcg Intravenous Once [employer]. Alvino Chapel, MD   50 mcg at 07/13/11 2006  . HYDROmorphone (DILAUDID) injection 1-2 mg  1-2 mg Intravenous Q4H PRN  Donato Heinz, MD   1 mg at 07/14/11 0510  . iohexol (OMNIPAQUE) 300 MG/ML solution 100 mL  100 mL Intravenous Once PRN Medication Radiologist, MD   100 mL at 07/13/11 2201  . iohexol (OMNIPAQUE) 300 MG/ML solution 40 mL  40 mL Oral Once PRN Medication Radiologist, MD   40 mL at 07/13/11 2015  . lactated ringers infusion   Intravenous Continuous Donato Heinz, MD 75 mL/hr at 07/14/11 0039    . ondansetron (ZOFRAN) injection 4 mg  4 mg Intravenous Once [employer]. Alvino Chapel, MD   4 mg at 07/13/11 2006  . ondansetron (ZOFRAN) injection 4 mg  4 mg Intravenous Q6H PRN Donato Heinz, MD   4 mg at 07/13/11 2336  . pantoprazole (PROTONIX) injection 40 mg  40 mg Intravenous QHS Donato Heinz, MD      . sodium chloride 0.9 % bolus 500 mL  500 mL Intravenous Once Ovid Curd R. Pickering, MD   500 mL at 07/13/11 2005  . sodium chloride 0.9 % bolus 500 mL  500 mL Intravenous Once Ovid Curd R. Alvino Chapel, MD       No current outpatient prescriptions on file as of 07/14/2011.    Results for orders placed during the hospital encounter of 07/13/11 (from the past 48 hour(s))  CBC     Status: Abnormal   Collection Time   07/13/11  7:45 PM      Component Value Range Comment   WBC 17.0 (*) 4.0 - 10.5 (K/uL)    RBC 5.52 (*) 3.87 - 5.11 (MIL/uL)    Hemoglobin 16.5 (*) 12.0 - 15.0 (g/dL)    HCT 47.8 (*) 36.0 - 46.0 (%)    MCV 86.6  78.0 - 100.0 (fL)    MCH 29.9  26.0 - 34.0 (pg)    MCHC 34.5  30.0 - 36.0 (g/dL)    RDW 13.2  11.5 - 15.5 (%)    Platelets 379  150 - 400 (K/uL)   DIFFERENTIAL     Status: Abnormal   Collection Time   07/13/11  7:45 PM      Component Value Range Comment   Neutrophils Relative 82 (*) 43 - 77 (%)    Neutro Abs 13.9 (*) 1.7 - 7.7 (K/uL)    Lymphocytes Relative 10 (*) 12 - 46 (%)    Lymphs Abs 1.6  0.7 - 4.0 (K/uL)    Monocytes Relative 8  3 - 12 (%)    Monocytes Absolute 1.4 (*) 0.1 - 1.0 (K/uL)    Eosinophils Relative 0  0 - 5 (%)    Eosinophils Absolute 0.0  0.0 - 0.7 (K/uL)     Basophils Relative 0  0 - 1 (%)    Basophils Absolute 0.0  0.0 - 0.1 (K/uL)   COMPREHENSIVE METABOLIC PANEL     Status: Abnormal   Collection Time   07/13/11  7:45 PM      Component Value Range Comment   Sodium 136  135 - 145 (mEq/L)    Potassium 3.9  3.5 - 5.1 (mEq/L)    Chloride 99  96 - 112 (mEq/L)    CO2 24  19 - 32 (mEq/L)    Glucose, Bld 127 (*) 70 - 99 (mg/dL)    BUN 6  6 - 23 (mg/dL)    Creatinine, Ser 0.61  0.50 - 1.10 (mg/dL)    Calcium 10.8 (*) 8.4 - 10.5 (mg/dL)    Total Protein 8.4 (*) 6.0 - 8.3 (g/dL)    Albumin 4.6  3.5 - 5.2 (g/dL)    AST 14  0 - 37 (U/L)    ALT 11  0 - 35 (U/L)    Alkaline Phosphatase 113  39 - 117 (U/L)    Total Bilirubin 0.9  0.3 - 1.2 (mg/dL)    GFR calc non Af Amer >90  >90 (mL/min)    GFR calc Af Amer >90  >90 (mL/min)   LIPASE, BLOOD     Status: Normal   Collection Time   07/13/11  7:45 PM      Component Value Range Comment   Lipase 18  11 - 59 (U/L)   URINALYSIS, ROUTINE W REFLEX MICROSCOPIC     Status: Abnormal   Collection Time   07/13/11  7:47 PM      Component Value Range Comment   Color, Urine YELLOW  YELLOW     APPearance CLEAR  CLEAR     Specific Gravity, Urine <1.005 (*) 1.005 - 1.030     pH 5.5  5.0 - 8.0     Glucose, UA NEGATIVE  NEGATIVE (mg/dL)    Hgb urine dipstick TRACE (*) NEGATIVE     Bilirubin Urine NEGATIVE  NEGATIVE     Ketones, ur NEGATIVE  NEGATIVE (mg/dL)    Protein, ur NEGATIVE  NEGATIVE (mg/dL)    Urobilinogen, UA 0.2  0.0 - 1.0 (mg/dL)  Nitrite NEGATIVE  NEGATIVE     Leukocytes, UA NEGATIVE  NEGATIVE    URINE MICROSCOPIC-ADD ON     Status: Abnormal   Collection Time   07/13/11  7:47 PM      Component Value Range Comment   Squamous Epithelial / LPF FEW (*) RARE     WBC, UA 3-6  <3 (WBC/hpf)    RBC / HPF 0-2  <3 (RBC/hpf)    Bacteria, UA RARE  RARE     Casts HYALINE CASTS (*) NEGATIVE    SURGICAL PCR SCREEN     Status: Normal   Collection Time   07/14/11  1:38 AM      Component Value Range Comment    MRSA, PCR NEGATIVE  NEGATIVE     Staphylococcus aureus NEGATIVE  NEGATIVE    CBC     Status: Abnormal   Collection Time   07/14/11  4:54 AM      Component Value Range Comment   WBC 13.9 (*) 4.0 - 10.5 (K/uL)    RBC 4.76  3.87 - 5.11 (MIL/uL)    Hemoglobin 13.8  12.0 - 15.0 (g/dL)    HCT 41.5  36.0 - 46.0 (%)    MCV 87.2  78.0 - 100.0 (fL)    MCH 29.0  26.0 - 34.0 (pg)    MCHC 33.3  30.0 - 36.0 (g/dL)    RDW 13.3  11.5 - 15.5 (%)    Platelets 304  150 - 400 (K/uL)   BASIC METABOLIC PANEL     Status: Abnormal   Collection Time   07/14/11  4:54 AM      Component Value Range Comment   Sodium 139  135 - 145 (mEq/L)    Potassium 3.6  3.5 - 5.1 (mEq/L)    Chloride 105  96 - 112 (mEq/L)    CO2 25  19 - 32 (mEq/L)    Glucose, Bld 122 (*) 70 - 99 (mg/dL)    BUN 5 (*) 6 - 23 (mg/dL)    Creatinine, Ser 0.54  0.50 - 1.10 (mg/dL)    Calcium 9.0  8.4 - 10.5 (mg/dL)    GFR calc non Af Amer >90  >90 (mL/min)    GFR calc Af Amer >90  >90 (mL/min)    Ct Abdomen Pelvis W Contrast  07/13/2011  *RADIOLOGY REPORT*  Clinical Data: Severe right lower quadrant pain for 2 days.  CT ABDOMEN AND PELVIS WITH CONTRAST  Technique:  Multidetector CT imaging of the abdomen and pelvis was performed following the standard protocol during bolus administration of intravenous contrast.  Contrast: 62m OMNIPAQUE IOHEXOL 300 MG/ML IV SOLN, 1023mOMNIPAQUE IOHEXOL 300 MG/ML IV SOLN  Comparison: None.  Findings: Small Bochdalek type right hemidiaphragmatic hernia. Small hematocele in the left lung base.  Slight fibrosis in the lungs.  Respiratory motion artifact.  The liver, spleen, gallbladder, pancreas, kidneys, and retroperitoneal lymph nodes are unremarkable.  Calcification of the abdominal aorta with no aneurysm.  There is a large heterogeneous mass in the left adrenal gland measuring about 2.8 x 3.2 cm diameter.  Heterogeneous contrast enhancement is suggested although low attenuation regions are present consistent with  fat.  Further characterization is recommended with either CT washout study or MRI.  The right adrenal gland contains a small indeterminate nodule measuring about 1 cm diameter.  The gastric wall is not thickened. Small bowel are not dilated.  Contrast material flows to the colon without evidence of obstruction.  No colonic distension.  No free air or free fluid in the abdomen.  Pelvis:  There is prominent fluid-filled distension of the appendix with diameter measuring up to 12 mm.  There is periappendiceal infiltration.  No evidence of abscess or loculated fluid collection.  There is associated thickening of the wall of the cecum and terminal ileum likely representing reactive inflammation. However, an obstructing tumor at the base of the cecum is not entirely excluded.  The uterus and adnexal structures are not enlarged.  The bladder wall is not thickened.  Surgical clips in the pelvis.  Small amount of free fluid in the low pelvis.  This could represent inflammatory fluid.  Normal alignment of the lumbar vertebrae.  IMPRESSION: Appendiceal distension with periappendiceal infiltration consistent with acute appendicitis.  Inflammatory change versus mass in the base of the cecum.  Incidental note of indeterminate 3 cm diameter left adrenal gland nodule and 1 cm diameter right adrenal gland nodule.  Further evaluation with MRI or CT washout study is recommended.  At the time of dictation, 2221 hours on 07/13/2011, results were telephoned to Dr. Alvino Chapel  Original Report Authenticated By: Neale Burly, M.D.    Review of Systems  Constitutional: Positive for fever and chills. Negative for weight loss, malaise/fatigue and diaphoresis.  HENT: Negative.   Eyes: Negative.   Respiratory: Positive for cough, shortness of breath (occasional) and wheezing.   Cardiovascular: Negative.   Gastrointestinal: Positive for nausea and abdominal pain (right lower quadrant). Negative for heartburn, vomiting, diarrhea,  constipation, blood in stool and melena.  Genitourinary: Negative.   Musculoskeletal: Negative.   Skin: Negative.   Neurological: Negative.  Negative for dizziness and weakness.  Endo/Heme/Allergies: Negative.   Psychiatric/Behavioral: Negative.     Blood pressure 106/62, pulse 95, temperature 98.5 F (36.9 C), temperature source Oral, resp. rate 19, height 5' 2"  (1.575 m), weight 45.949 kg (101 lb 4.8 oz), SpO2 92.00%. Physical Exam  Constitutional: She is oriented to person, place, and time. She appears well-developed and well-nourished. No distress.       thin  HENT:  Head: Normocephalic and atraumatic.  Eyes: Conjunctivae and EOM are normal. Pupils are equal, round, and reactive to light. No scleral icterus.  Neck: Normal range of motion. Neck supple. No tracheal deviation present. No thyromegaly present.  Cardiovascular: Normal rate, regular rhythm and normal heart sounds.   Respiratory: Effort normal and breath sounds normal. No respiratory distress. She has no wheezes.  GI: Soft. She exhibits no distension and no mass. There is tenderness (right lower quadrant abdominal wall tenderness. Point tenderness at McBurney's point. No diffuse peritoneal signs.). There is guarding (voluntary). There is no rebound.  Musculoskeletal: Normal range of motion.  Lymphadenopathy:    She has no cervical adenopathy.  Neurological: She is alert and oriented to person, place, and time.  Skin: Skin is warm and dry.     Assessment/Plan Acute appendicitis. Risks benefits and alternatives of a laparoscopic possible open appendectomy are discussed at length patient including but not limited to risk of bleeding, infection, appendiceal stump leak, intraoperative cardiac and pulmonary events. At this point patient is n.p.o. Continue IV fluids. Continue IV antibiotics. Continue DVT prophylaxis. Patient's questions and concerns are addressed the patient will be consented for the planned  appendectomy.  Lariza Cothron C 07/14/2011, 7:11 AM

## 2011-07-14 NOTE — Anesthesia Postprocedure Evaluation (Signed)
  Anesthesia Post-op Note  Patient: Stacey Nash  Procedure(s) Performed: Procedure(s) (LRB): APPENDECTOMY LAPAROSCOPIC (N/A)  Patient Location: PACU  Anesthesia Type: General  Level of Consciousness: sedated  Airway and Oxygen Therapy: Patient Spontanous Breathing and Patient connected to face mask oxygen  Post-op Pain: none  Post-op Assessment: Post-op Vital signs reviewed, Patient's Cardiovascular Status Stable, Respiratory Function Stable and Patent Airway  Post-op Vital Signs: Reviewed and stable  Complications: No apparent anesthesia complications

## 2011-07-16 NOTE — Discharge Summary (Signed)
Physician Discharge Summary  Patient ID: Stacey Nash MRN: [id number] DOB/AGE: Oct 21, 1950 61 y.o.  Admit date: 07/13/2011 Discharge date: 07/16/2011  Admission Diagnoses:  Acute appendicitis  Discharge Diagnoses:  The same Active Problems:  * No active hospital problems. *    Discharged Condition: stable  Hospital Course: Patient presented to [facility] ED with abdominal pain.  Work-up was consistent with acute appendicitis.  She was taken to the OR.  Tolerated the procedure well.  Was advanced on diet.  Plans were made for discharge.  Consults: None  Significant Diagnostic Studies: radiology: CT scan: abd/pel  Treatments: surgery: laparoscopic appendectomy  Discharge Exam: Blood pressure 145/68, pulse 98, temperature 98.2 F (36.8 C), temperature source Oral, resp. rate 16, height 5' 2"  (1.575 m), weight 45.949 kg (101 lb 4.8 oz), SpO2 92.00%. Patient was seen earlier.  She is comfortable.  Abdominal exam consistent with post-operative symptoms/findings.  Disposition: Home or Self Care  Discharge Orders    Future Orders Please Complete By Expires   Diet - low sodium heart healthy      Increase activity slowly      Discharge instructions      Comments:   Increase activity as tolerated. May place ice pack for comfort.    Driving Restrictions      Comments:   No driving while on pain medications.    Lifting restrictions      Comments:   No lifting over 20lbs for 4-5 weeks post-op.    Discharge wound care:      Comments:   Clean surgical sites with soap and water.  May shower the morning after surgery unless instructed by Dr. Geroge Baseman otherwise.  No soaking for 2-3 weeks.    If adhesive strips are in place, they may be removed in 1-2 weeks while in the shower.    Call MD for:  temperature >100.4      Call MD for:  persistant nausea and vomiting      Call MD for:  severe uncontrolled pain      Call MD for:  redness, tenderness, or signs of infection (pain, swelling,  redness, odor or green/yellow discharge around incision site)        Medication List  As of 07/16/2011 10:00 PM   STOP taking these medications         GOODY HEADACHE PO         TAKE these medications         HYDROcodone-acetaminophen 5-325 MG per tablet   Commonly known as: NORCO   Take 1-2 tablets by mouth every 4 (four) hours as needed for pain.           Follow-up Information    Follow up with Lucciano Vitali C, MD in 3 weeks.   Contact information:   [place] [phone]          Signed: Donato Heinz 07/16/2011, 10:00 PM

## 2011-07-20 ENCOUNTER — Encounter (HOSPITAL_COMMUNITY): Payer: Self-pay | Admitting: General Surgery

## 2011-10-06 ENCOUNTER — Emergency Department (HOSPITAL_COMMUNITY)
Admission: EM | Admit: 2011-10-06 | Discharge: 2011-10-06 | Disposition: A | Discharge status: Home / Self Care | Payer: Medicaid Other | Attending: Emergency Medicine | Admitting: Emergency Medicine

## 2011-10-06 ENCOUNTER — Emergency Department (HOSPITAL_COMMUNITY): Payer: Medicaid Other

## 2011-10-06 ENCOUNTER — Encounter (HOSPITAL_COMMUNITY): Payer: Self-pay | Admitting: Emergency Medicine

## 2011-10-06 DIAGNOSIS — F17200 Nicotine dependence, unspecified, uncomplicated: Secondary | ICD-10-CM | POA: Insufficient documentation

## 2011-10-06 DIAGNOSIS — M5412 Radiculopathy, cervical region: Secondary | ICD-10-CM

## 2011-10-06 DIAGNOSIS — J4489 Other specified chronic obstructive pulmonary disease: Secondary | ICD-10-CM | POA: Insufficient documentation

## 2011-10-06 DIAGNOSIS — M79609 Pain in unspecified limb: Secondary | ICD-10-CM | POA: Insufficient documentation

## 2011-10-06 DIAGNOSIS — R209 Unspecified disturbances of skin sensation: Secondary | ICD-10-CM | POA: Insufficient documentation

## 2011-10-06 DIAGNOSIS — M25519 Pain in unspecified shoulder: Secondary | ICD-10-CM | POA: Insufficient documentation

## 2011-10-06 DIAGNOSIS — J449 Chronic obstructive pulmonary disease, unspecified: Secondary | ICD-10-CM | POA: Insufficient documentation

## 2011-10-06 DIAGNOSIS — M549 Dorsalgia, unspecified: Secondary | ICD-10-CM | POA: Insufficient documentation

## 2011-10-06 LAB — CBC
MCV: 86.3 fL (ref 78.0–100.0)
Platelets: 302 10*3/uL (ref 150–400)
RBC: 5.24 MIL/uL — ABNORMAL HIGH (ref 3.87–5.11)
WBC: 8.6 10*3/uL (ref 4.0–10.5)

## 2011-10-06 LAB — URINALYSIS, ROUTINE W REFLEX MICROSCOPIC
Glucose, UA: NEGATIVE mg/dL
Hgb urine dipstick: NEGATIVE
Specific Gravity, Urine: <1.005 — ABNORMAL LOW (ref 1.005–1.030)
pH: 6 (ref 5.0–8.0)

## 2011-10-06 LAB — DIFFERENTIAL
Eosinophils Relative: 1 % (ref 0–5)
Lymphocytes Relative: 23 % (ref 12–46)
Lymphs Abs: 2 10*3/uL (ref 0.7–4.0)

## 2011-10-06 LAB — POCT I-STAT TROPONIN I: Troponin i, poc: 0 ng/mL (ref 0.00–0.08)

## 2011-10-06 LAB — BASIC METABOLIC PANEL
CO2: 23 mEq/L (ref 19–32)
Glucose, Bld: 109 mg/dL — ABNORMAL HIGH (ref 70–99)
Potassium: 4.3 mEq/L (ref 3.5–5.1)
Sodium: 138 mEq/L (ref 135–145)

## 2011-10-06 MED ORDER — KETOROLAC TROMETHAMINE 30 MG/ML IJ SOLN
30.0000 mg | Freq: Once | INTRAMUSCULAR | Status: AC
  Administered 2011-10-06: 30 mg via INTRAVENOUS
  Filled 2011-10-06: qty 1

## 2011-10-06 MED ORDER — NAPROXEN 500 MG PO TABS: 500.0000 mg | ORAL_TABLET | Freq: Two times a day (BID) | ORAL | Status: AC

## 2011-10-06 MED ORDER — OXYCODONE-ACETAMINOPHEN 5-325 MG PO TABS: 1.0000 | ORAL_TABLET | Freq: Four times a day (QID) | ORAL | Status: AC | PRN

## 2011-10-06 MED ORDER — HYDROMORPHONE HCL PF 1 MG/ML IJ SOLN
1.0000 mg | Freq: Once | INTRAMUSCULAR | Status: AC
  Administered 2011-10-06: 0.5 mg via INTRAVENOUS
  Filled 2011-10-06: qty 1

## 2011-10-06 NOTE — Discharge Instructions (Signed)
Cervical Radiculopathy Cervical radiculopathy happens when a nerve in the neck is pinched or bruised by a slipped (herniated) disk or by arthritic changes in the bones of the cervical spine. This can occur due to an injury or as part of the normal aging process. Pressure on the cervical nerves can cause pain or numbness that runs from your neck all the way down into your arm and fingers. CAUSES  There are many possible causes, including:  Injury.   Muscle tightness in the neck from overuse.   Swollen, painful joints (arthritis).   Breakdown or degeneration in the bones and joints of the spine (spondylosis) due to aging.   Bone spurs that may develop near the cervical nerves.  SYMPTOMS  Symptoms include pain, weakness, or numbness in the affected arm and hand. Pain can be severe or irritating. Symptoms may be worse when extending or turning the neck. DIAGNOSIS  Your caregiver will ask about your symptoms and do a physical exam. He or she may test your strength and reflexes. X-rays, CT scans, and MRI scans may be needed in cases of injury or if the symptoms do not go away after a period of time. Electromyography (EMG) or nerve conduction testing may be done to study how your nerves and muscles are working. TREATMENT  Your caregiver may recommend certain exercises to help relieve your symptoms. Cervical radiculopathy can, and often does, get better with time and treatment. If your problems continue, treatment options may include:  Wearing a soft collar for short periods of time.   Physical therapy to strengthen the neck muscles.   Medicines, such as nonsteroidal anti-inflammatory drugs (NSAIDs), oral corticosteroids, or spinal injections.   Surgery. Different types of surgery may be done depending on the cause of your problems.  HOME CARE INSTRUCTIONS   Put ice on the affected area.   Put ice in a plastic bag.   Place a towel between your skin and the bag.   Leave the ice on for 15  to 20 minutes, 3 to 4 times a day or as directed by your caregiver.   Use a flat pillow when you sleep.   Only take over-the-counter or prescription medicines for pain, discomfort, or fever as directed by your caregiver.   If physical therapy was prescribed, follow your caregiver's directions.   If a soft collar was prescribed, use it as directed.  SEEK IMMEDIATE MEDICAL CARE IF:   Your pain gets much worse and cannot be controlled with medicines.   You have weakness or numbness in your hand, arm, face, or leg.   You have a high fever or a stiff, rigid neck.   You lose bowel or bladder control (incontinence).   You have trouble with walking, balance, or speaking.  MAKE SURE YOU:   Understand these instructions.   Will watch your condition.   Will get help right away if you are not doing well or get worse.  Document Released: 02/07/2001 Document Revised: 05/04/2011 Document Reviewed: 12/27/2010 [facility] Patient Information 2012 [employer].

## 2011-10-06 NOTE — ED Notes (Signed)
Pt to mri 

## 2011-10-06 NOTE — ED Notes (Signed)
Pt returned from mri, family member to bedside.
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
## 2011-10-06 NOTE — ED Notes (Signed)
Pt reports that she has been having left arm/shoulder pain for several days, pain worse today and thinks her left arm is having numbness at this time. Equal grips and moves on command, describes as tingling.  Denies any cp or sob, no n/v, no fever.

## 2011-10-06 NOTE — ED Notes (Signed)
Pt c/o left arm and shoulder pain for a couple days. Pt states she is having a numbness feeling. Pt states her back has been hurting and she has been using a hand held back massager and thinks maybe she has pulled a muscle.

## 2011-10-06 NOTE — ED Notes (Signed)
Dr.king to see pt.

## 2011-10-06 NOTE — ED Notes (Signed)
Pt stated she is feeling much better. Rates pain 3/10

## 2013-04-12 ENCOUNTER — Emergency Department (HOSPITAL_COMMUNITY): Payer: Medicaid Other

## 2013-04-12 ENCOUNTER — Emergency Department (HOSPITAL_COMMUNITY)
Admission: EM | Admit: 2013-04-12 | Discharge: 2013-04-12 | Disposition: A | Discharge status: Home / Self Care | Payer: Medicaid Other | Attending: Emergency Medicine | Admitting: Emergency Medicine

## 2013-04-12 ENCOUNTER — Encounter (HOSPITAL_COMMUNITY): Payer: Self-pay | Admitting: Emergency Medicine

## 2013-04-12 DIAGNOSIS — R51 Headache: Secondary | ICD-10-CM | POA: Insufficient documentation

## 2013-04-12 DIAGNOSIS — F17200 Nicotine dependence, unspecified, uncomplicated: Secondary | ICD-10-CM | POA: Insufficient documentation

## 2013-04-12 DIAGNOSIS — Z79899 Other long term (current) drug therapy: Secondary | ICD-10-CM | POA: Insufficient documentation

## 2013-04-12 DIAGNOSIS — R000 Tachycardia, unspecified: Secondary | ICD-10-CM | POA: Insufficient documentation

## 2013-04-12 DIAGNOSIS — J441 Chronic obstructive pulmonary disease with (acute) exacerbation: Secondary | ICD-10-CM | POA: Insufficient documentation

## 2013-04-12 DIAGNOSIS — IMO0002 Reserved for concepts with insufficient information to code with codable children: Secondary | ICD-10-CM | POA: Insufficient documentation

## 2013-04-12 DIAGNOSIS — Z8719 Personal history of other diseases of the digestive system: Secondary | ICD-10-CM | POA: Insufficient documentation

## 2013-04-12 DIAGNOSIS — J40 Bronchitis, not specified as acute or chronic: Secondary | ICD-10-CM

## 2013-04-12 LAB — CBC
Hemoglobin: 15 g/dL (ref 12.0–15.0)
MCV: 88.4 fL (ref 78.0–100.0)
Platelets: 251 10*3/uL (ref 150–400)
RBC: 5.02 MIL/uL (ref 3.87–5.11)

## 2013-04-12 LAB — BASIC METABOLIC PANEL
CO2: 25 mEq/L (ref 19–32)
Calcium: 9.6 mg/dL (ref 8.4–10.5)
Chloride: 95 mEq/L — ABNORMAL LOW (ref 96–112)
GFR calc Af Amer: >90 mL/min (ref >90)
Glucose, Bld: 104 mg/dL — ABNORMAL HIGH (ref 70–99)
Potassium: 4.2 mEq/L (ref 3.5–5.1)
Sodium: 132 mEq/L — ABNORMAL LOW (ref 135–145)

## 2013-04-12 MED ORDER — PREDNISONE 50 MG PO TABS
60.0000 mg | ORAL_TABLET | Freq: Once | ORAL | Status: AC
  Administered 2013-04-12: 60 mg via ORAL
  Filled 2013-04-12 (×2): qty 1

## 2013-04-12 MED ORDER — ONDANSETRON 4 MG PO TBDP
ORAL_TABLET | ORAL | Status: AC
  Filled 2013-04-12: qty 1

## 2013-04-12 MED ORDER — LEVOFLOXACIN 750 MG PO TABS
750.0000 mg | ORAL_TABLET | Freq: Once | ORAL | Status: DC
  Filled 2013-04-12: qty 1

## 2013-04-12 MED ORDER — PREDNISONE 20 MG PO TABS: 60.0000 mg | ORAL_TABLET | Freq: Every day | ORAL | Status: DC

## 2013-04-12 MED ORDER — ALBUTEROL SULFATE (5 MG/ML) 0.5% IN NEBU
5.0000 mg | INHALATION_SOLUTION | Freq: Once | RESPIRATORY_TRACT | Status: AC
  Administered 2013-04-12: 5 mg via RESPIRATORY_TRACT
  Filled 2013-04-12: qty 1

## 2013-04-12 MED ORDER — DOXYCYCLINE HYCLATE 100 MG PO TABS
100.0000 mg | ORAL_TABLET | Freq: Once | ORAL | Status: AC
  Administered 2013-04-12: 100 mg via ORAL
  Filled 2013-04-12: qty 1

## 2013-04-12 MED ORDER — ONDANSETRON 4 MG PO TBDP
4.0000 mg | ORAL_TABLET | Freq: Once | ORAL | Status: AC
  Administered 2013-04-12: 4 mg via ORAL

## 2013-04-12 MED ORDER — DOXYCYCLINE HYCLATE 100 MG PO CAPS: 100.0000 mg | ORAL_CAPSULE | Freq: Two times a day (BID) | ORAL | Status: DC

## 2013-04-12 NOTE — ED Notes (Signed)
Cough, congestion, sinus problems, scratchy throat per pt.

## 2013-04-12 NOTE — ED Notes (Signed)
Went to administer levaquin, and patient reported she cannot tolerate levaquin. Reports levaquin causes sever vomiting, diarrhea, and GI upset. Dr. Marnette Burgess notified. Stated to administer doxycycline instead.

## 2013-04-14 ENCOUNTER — Encounter (HOSPITAL_COMMUNITY): Payer: Self-pay | Admitting: Emergency Medicine

## 2013-04-14 ENCOUNTER — Inpatient Hospital Stay (HOSPITAL_COMMUNITY)
Admission: EM | Admit: 2013-04-14 | Discharge: 2013-04-16 | DRG: 190 | Disposition: A | Discharge status: Home / Self Care | Payer: Medicaid Other | Attending: Internal Medicine | Admitting: Internal Medicine

## 2013-04-14 DIAGNOSIS — R739 Hyperglycemia, unspecified: Secondary | ICD-10-CM | POA: Diagnosis present

## 2013-04-14 DIAGNOSIS — Z720 Tobacco use: Secondary | ICD-10-CM

## 2013-04-14 DIAGNOSIS — IMO0002 Reserved for concepts with insufficient information to code with codable children: Secondary | ICD-10-CM | POA: Diagnosis present

## 2013-04-14 DIAGNOSIS — R030 Elevated blood-pressure reading, without diagnosis of hypertension: Secondary | ICD-10-CM | POA: Diagnosis present

## 2013-04-14 DIAGNOSIS — T380X5A Adverse effect of glucocorticoids and synthetic analogues, initial encounter: Secondary | ICD-10-CM | POA: Diagnosis present

## 2013-04-14 DIAGNOSIS — J449 Chronic obstructive pulmonary disease, unspecified: Secondary | ICD-10-CM

## 2013-04-14 DIAGNOSIS — Z833 Family history of diabetes mellitus: Secondary | ICD-10-CM

## 2013-04-14 DIAGNOSIS — J9601 Acute respiratory failure with hypoxia: Secondary | ICD-10-CM

## 2013-04-14 DIAGNOSIS — R7309 Other abnormal glucose: Secondary | ICD-10-CM | POA: Diagnosis present

## 2013-04-14 DIAGNOSIS — Z9981 Dependence on supplemental oxygen: Secondary | ICD-10-CM

## 2013-04-14 DIAGNOSIS — M542 Cervicalgia: Secondary | ICD-10-CM | POA: Diagnosis present

## 2013-04-14 DIAGNOSIS — F17200 Nicotine dependence, unspecified, uncomplicated: Secondary | ICD-10-CM | POA: Diagnosis present

## 2013-04-14 DIAGNOSIS — J441 Chronic obstructive pulmonary disease with (acute) exacerbation: Principal | ICD-10-CM | POA: Diagnosis present

## 2013-04-14 DIAGNOSIS — E876 Hypokalemia: Secondary | ICD-10-CM | POA: Diagnosis present

## 2013-04-14 DIAGNOSIS — G8929 Other chronic pain: Secondary | ICD-10-CM | POA: Diagnosis present

## 2013-04-14 DIAGNOSIS — T50904A Poisoning by unspecified drugs, medicaments and biological substances, undetermined, initial encounter: Secondary | ICD-10-CM

## 2013-04-14 DIAGNOSIS — J9600 Acute respiratory failure, unspecified whether with hypoxia or hypercapnia: Secondary | ICD-10-CM | POA: Diagnosis present

## 2013-04-14 HISTORY — DX: Other chronic pain: G89.29

## 2013-04-14 HISTORY — DX: Tobacco use: Z72.0

## 2013-04-14 HISTORY — DX: Cervicalgia: M54.2

## 2013-04-14 HISTORY — DX: Reserved for concepts with insufficient information to code with codable children: IMO0002

## 2013-04-14 LAB — BASIC METABOLIC PANEL
BUN: 10 mg/dL (ref 6–23)
CO2: 23 mEq/L (ref 19–32)
Chloride: 100 mEq/L (ref 96–112)
GFR calc Af Amer: >90 mL/min (ref >90)
GFR calc non Af Amer: >90 mL/min (ref >90)
Glucose, Bld: 138 mg/dL — ABNORMAL HIGH (ref 70–99)
Potassium: 3.4 mEq/L — ABNORMAL LOW (ref 3.5–5.1)

## 2013-04-14 LAB — CBC WITH DIFFERENTIAL/PLATELET
Basophils Relative: 0 % (ref 0–1)
HCT: 43.5 % (ref 36.0–46.0)
Hemoglobin: 14.8 g/dL (ref 12.0–15.0)
Lymphs Abs: 1.1 10*3/uL (ref 0.7–4.0)
MCH: 29.8 pg (ref 26.0–34.0)
MCHC: 34 g/dL (ref 30.0–36.0)
Monocytes Absolute: 0.5 10*3/uL (ref 0.1–1.0)
Monocytes Relative: 5 % (ref 3–12)
Neutro Abs: 8.6 10*3/uL — ABNORMAL HIGH (ref 1.7–7.7)
RBC: 4.96 MIL/uL (ref 3.87–5.11)
WBC: 10.2 10*3/uL (ref 4.0–10.5)

## 2013-04-14 LAB — BLOOD GAS, ARTERIAL
Acid-base deficit: 2.1 mmol/L — ABNORMAL HIGH (ref 0.0–2.0)
Bicarbonate: 21.8 mEq/L (ref 20.0–24.0)
Patient temperature: 37
TCO2: 19.1 mmol/L (ref 0–100)
pH, Arterial: 7.41 (ref 7.350–7.450)

## 2013-04-14 LAB — GLUCOSE, CAPILLARY: Glucose-Capillary: 150 mg/dL — ABNORMAL HIGH (ref 70–99)

## 2013-04-14 MED ORDER — DOXYCYCLINE HYCLATE 100 MG PO TABS
100.0000 mg | ORAL_TABLET | Freq: Once | ORAL | Status: DC
  Filled 2013-04-14: qty 1

## 2013-04-14 MED ORDER — BENZONATATE 100 MG PO CAPS
100.0000 mg | ORAL_CAPSULE | Freq: Three times a day (TID) | ORAL | Status: DC
  Administered 2013-04-15: 100 mg via ORAL
  Filled 2013-04-14 (×2): qty 1

## 2013-04-14 MED ORDER — ALPRAZOLAM 0.25 MG PO TABS
0.2500 mg | ORAL_TABLET | Freq: Three times a day (TID) | ORAL | Status: DC | PRN
  Filled 2013-04-14: qty 1

## 2013-04-14 MED ORDER — ACETAMINOPHEN 650 MG RE SUPP: 650.0000 mg | Freq: Four times a day (QID) | RECTAL | Status: DC | PRN

## 2013-04-14 MED ORDER — ALBUTEROL SULFATE (5 MG/ML) 0.5% IN NEBU
10.0000 mg | INHALATION_SOLUTION | Freq: Once | RESPIRATORY_TRACT | Status: AC
  Administered 2013-04-14: 10 mg via RESPIRATORY_TRACT
  Filled 2013-04-14: qty 2

## 2013-04-14 MED ORDER — LEVOFLOXACIN IN D5W 500 MG/100ML IV SOLN
500.0000 mg | INTRAVENOUS | Status: DC
  Administered 2013-04-14 – 2013-04-15 (×2): 500 mg via INTRAVENOUS
  Filled 2013-04-14 (×2): qty 100

## 2013-04-14 MED ORDER — LEVOFLOXACIN IN D5W 500 MG/100ML IV SOLN
INTRAVENOUS | Status: AC
  Filled 2013-04-14: qty 100

## 2013-04-14 MED ORDER — FAMOTIDINE 20 MG PO TABS
20.0000 mg | ORAL_TABLET | Freq: Every day | ORAL | Status: DC
  Administered 2013-04-15: 20 mg via ORAL
  Filled 2013-04-14 (×2): qty 1

## 2013-04-14 MED ORDER — IPRATROPIUM BROMIDE 0.02 % IN SOLN
0.5000 mg | RESPIRATORY_TRACT | Status: DC
  Administered 2013-04-14 – 2013-04-16 (×8): 0.5 mg via RESPIRATORY_TRACT
  Filled 2013-04-14 (×9): qty 2.5

## 2013-04-14 MED ORDER — IPRATROPIUM BROMIDE 0.02 % IN SOLN
0.5000 mg | Freq: Once | RESPIRATORY_TRACT | Status: AC
  Administered 2013-04-14: 0.5 mg via RESPIRATORY_TRACT
  Filled 2013-04-14: qty 2.5

## 2013-04-14 MED ORDER — INSULIN ASPART 100 UNIT/ML ~~LOC~~ SOLN
0.0000 [IU] | Freq: Three times a day (TID) | SUBCUTANEOUS | Status: DC
  Administered 2013-04-15 (×2): 2 [IU] via SUBCUTANEOUS
  Administered 2013-04-15: 3 [IU] via SUBCUTANEOUS

## 2013-04-14 MED ORDER — POTASSIUM CHLORIDE CRYS ER 20 MEQ PO TBCR
20.0000 meq | EXTENDED_RELEASE_TABLET | Freq: Two times a day (BID) | ORAL | Status: DC
  Administered 2013-04-15: 20 meq via ORAL
  Filled 2013-04-14 (×2): qty 1

## 2013-04-14 MED ORDER — ALUM & MAG HYDROXIDE-SIMETH 200-200-20 MG/5ML PO SUSP
30.0000 mL | Freq: Four times a day (QID) | ORAL | Status: DC | PRN
  Administered 2013-04-15: 30 mL via ORAL
  Filled 2013-04-14: qty 30

## 2013-04-14 MED ORDER — BECLOMETHASONE DIPROPIONATE 80 MCG/ACT IN AERS
2.0000 | INHALATION_SPRAY | Freq: Two times a day (BID) | RESPIRATORY_TRACT | Status: DC
  Administered 2013-04-14 – 2013-04-16 (×4): 2 via RESPIRATORY_TRACT
  Filled 2013-04-14: qty 8.7

## 2013-04-14 MED ORDER — ONDANSETRON HCL 4 MG/2ML IJ SOLN
4.0000 mg | Freq: Four times a day (QID) | INTRAMUSCULAR | Status: DC | PRN
  Administered 2013-04-14 – 2013-04-15 (×2): 4 mg via INTRAVENOUS
  Filled 2013-04-14 (×2): qty 2

## 2013-04-14 MED ORDER — INSULIN ASPART 100 UNIT/ML ~~LOC~~ SOLN: 0.0000 [IU] | Freq: Every day | SUBCUTANEOUS | Status: DC

## 2013-04-14 MED ORDER — POTASSIUM CHLORIDE IN NACL 20-0.9 MEQ/L-% IV SOLN
INTRAVENOUS | Status: DC
  Administered 2013-04-14 – 2013-04-16 (×3): via INTRAVENOUS

## 2013-04-14 MED ORDER — OXYCODONE HCL 5 MG PO TABS
5.0000 mg | ORAL_TABLET | ORAL | Status: DC | PRN
  Administered 2013-04-15: 5 mg via ORAL
  Filled 2013-04-14: qty 1

## 2013-04-14 MED ORDER — ONDANSETRON 8 MG PO TBDP
8.0000 mg | ORAL_TABLET | Freq: Once | ORAL | Status: AC
  Administered 2013-04-14: 8 mg via ORAL

## 2013-04-14 MED ORDER — ACETAMINOPHEN 325 MG PO TABS
650.0000 mg | ORAL_TABLET | Freq: Four times a day (QID) | ORAL | Status: DC | PRN
  Administered 2013-04-15 – 2013-04-16 (×2): 650 mg via ORAL
  Filled 2013-04-14 (×2): qty 2

## 2013-04-14 MED ORDER — METHYLPREDNISOLONE SODIUM SUCC 125 MG IJ SOLR
60.0000 mg | Freq: Four times a day (QID) | INTRAMUSCULAR | Status: DC
  Administered 2013-04-14 – 2013-04-15 (×3): 60 mg via INTRAVENOUS
  Filled 2013-04-14 (×3): qty 2

## 2013-04-14 MED ORDER — ONDANSETRON 8 MG PO TBDP
ORAL_TABLET | ORAL | Status: AC
  Filled 2013-04-14: qty 1

## 2013-04-14 MED ORDER — ALBUTEROL SULFATE (5 MG/ML) 0.5% IN NEBU
2.5000 mg | INHALATION_SOLUTION | RESPIRATORY_TRACT | Status: DC
  Administered 2013-04-14 – 2013-04-16 (×8): 2.5 mg via RESPIRATORY_TRACT
  Filled 2013-04-14 (×9): qty 0.5

## 2013-04-14 MED ORDER — ONDANSETRON HCL 4 MG PO TABS
4.0000 mg | ORAL_TABLET | Freq: Four times a day (QID) | ORAL | Status: DC | PRN
  Administered 2013-04-15: 4 mg via ORAL
  Filled 2013-04-14: qty 1

## 2013-04-14 MED ORDER — ALBUTEROL SULFATE (5 MG/ML) 0.5% IN NEBU: 2.5000 mg | INHALATION_SOLUTION | RESPIRATORY_TRACT | Status: DC | PRN

## 2013-04-14 MED ORDER — OXYCODONE-ACETAMINOPHEN 5-325 MG PO TABS
1.0000 | ORAL_TABLET | Freq: Once | ORAL | Status: AC
  Administered 2013-04-14: 1 via ORAL
  Filled 2013-04-14: qty 1

## 2013-04-14 MED ORDER — HYDROCOD POLST-CHLORPHEN POLST 10-8 MG/5ML PO LQCR: 5.0000 mL | Freq: Two times a day (BID) | ORAL | Status: DC | PRN

## 2013-04-14 MED ORDER — METHYLPREDNISOLONE SODIUM SUCC 125 MG IJ SOLR
125.0000 mg | Freq: Once | INTRAMUSCULAR | Status: AC
  Administered 2013-04-14: 125 mg via INTRAVENOUS
  Filled 2013-04-14: qty 2

## 2013-04-14 MED ORDER — INSULIN GLARGINE 100 UNIT/ML ~~LOC~~ SOLN
10.0000 [IU] | Freq: Every day | SUBCUTANEOUS | Status: DC
  Administered 2013-04-15: 10 [IU] via SUBCUTANEOUS
  Filled 2013-04-14 (×3): qty 0.1

## 2013-04-14 MED ORDER — ALBUTEROL SULFATE (5 MG/ML) 0.5% IN NEBU
5.0000 mg | INHALATION_SOLUTION | Freq: Once | RESPIRATORY_TRACT | Status: AC
  Administered 2013-04-14: 5 mg via RESPIRATORY_TRACT
  Filled 2013-04-14: qty 1

## 2013-04-14 MED ORDER — ENOXAPARIN SODIUM 40 MG/0.4ML ~~LOC~~ SOLN
40.0000 mg | SUBCUTANEOUS | Status: DC
  Administered 2013-04-14 – 2013-04-15 (×2): 40 mg via SUBCUTANEOUS
  Filled 2013-04-14: qty 0.4

## 2013-04-14 NOTE — ED Notes (Signed)
Report given to Caryl Pina, RN on unit 300.

## 2013-04-14 NOTE — Progress Notes (Signed)
04/14/13 1624 Notified Dr. Caryn Section of patient arrival to floor for admission. Donavan Foil, RN

## 2013-04-14 NOTE — ED Provider Notes (Signed)
CSN: [id number]     Arrival date & time 04/14/13  2637 History   First MD Initiated Contact with Patient 04/14/13 [phone]     Chief Complaint  Patient presents with  . Shortness of Breath   (Consider location/radiation/quality/duration/timing/severity/associated sxs/prior Treatment) Patient is a 62 y.o. female presenting with shortness of breath. The history is provided by the patient.  Shortness of Breath Severity:  Moderate Onset quality:  Gradual Duration:  2 days Timing:  Constant Progression:  Worsening Chronicity:  Recurrent Context: URI   Relieved by:  Nothing Worsened by:  Activity Ineffective treatments:  Inhaler (Doxycycline and prednisone) Associated symptoms: cough and wheezing   Associated symptoms: no abdominal pain, no chest pain, no ear pain, no fever, no headaches, no hemoptysis, no neck pain, no rash, no sore throat, no sputum production, no syncope, no swollen glands and no vomiting   Associated symptoms comment:  Diarrhea which started after taking doxycycline Wheezing:    Severity:  Severe   Onset quality:  Gradual   Timing:  Constant   Progression:  Worsening   Chronicity:  Recurrent Risk factors: tobacco use   Risk factors comment:  History of COPD and sick contacts   Patient returns to the emergency room today with complaints of continued shortness of breath. She was seen here 2 days ago and treated for bronchitis. She states she is taking doxycycline, prednisone, and albuterol without relief. She also reports having diarrhea since starting the doxycycline but has been worse today. She describes the diarrhea as watery. She denies black or tarry appearing stools. She denies fever, chest pain, abdominal pain, or swelling.  Patient does report history of COPD and use of home oxygen states that he has discontinued her oxygen because she improved previously. PCP is [facility].  Past Medical History  Diagnosis Date  . Diverticula of intestine    . COPD (chronic obstructive pulmonary disease)    Past Surgical History  Procedure Laterality Date  . Eye surgery    . Laparoscopic appendectomy  07/14/2011    Procedure: APPENDECTOMY LAPAROSCOPIC;  Surgeon: Donato Heinz, MD;  Location: [facility];  Service: General;  Laterality: N/A;   No family history on file. History  Substance Use Topics  . Smoking status: Current Every Day Smoker    Types: Cigarettes  . Smokeless tobacco: Not on file  . Alcohol Use: No   OB History   Grav Para Term Preterm Abortions TAB SAB Ect Mult Living                 Review of Systems  Constitutional: Negative for fever, chills, activity change and appetite change.  HENT: Positive for congestion. Negative for ear pain, facial swelling, rhinorrhea, sore throat and trouble swallowing.   Eyes: Negative for visual disturbance.  Respiratory: Positive for cough, chest tightness, shortness of breath and wheezing. Negative for hemoptysis, sputum production, choking and stridor.   Cardiovascular: Negative for chest pain, leg swelling and syncope.  Gastrointestinal: Positive for diarrhea. Negative for nausea, vomiting, abdominal pain, blood in stool and abdominal distention.  Musculoskeletal: Negative for back pain, neck pain and neck stiffness.  Skin: Negative.  Negative for rash.  Neurological: Negative for dizziness, weakness, numbness and headaches.  Hematological: Negative for adenopathy.  Psychiatric/Behavioral: Negative for confusion.  All other systems reviewed and are negative.    Allergies  Sulfa antibiotics  Home Medications   Current Outpatient Rx  Name  Route  Sig  Dispense  Refill  .  albuterol (PROVENTIL) (2.5 MG/3ML) 0.083% nebulizer solution   Nebulization   Take 2.5 mg by nebulization every 6 (six) hours as needed for wheezing or shortness of breath.         . Aspirin-Acetaminophen (GOODYS BODY PAIN PO)   Oral   Take 0.5 packets by mouth daily as needed (headache). Pain          . beclomethasone (QVAR) 80 MCG/ACT inhaler   Inhalation   Inhale 2 puffs into the lungs 2 (two) times daily.          Marland Kitchen doxycycline (VIBRAMYCIN) 100 MG capsule   Oral   Take 1 capsule (100 mg total) by mouth 2 (two) times daily.   20 capsule   0   . predniSONE (DELTASONE) 20 MG tablet   Oral   Take 3 tablets (60 mg total) by mouth daily.   15 tablet   0   . tiotropium (SPIRIVA HANDIHALER) 18 MCG inhalation capsule   Inhalation   Place 18 mcg into inhaler and inhale daily.          BP 158/86  Pulse 112  Temp(Src) 97.7 F (36.5 C) (Oral)  Resp 22  Ht 5' 2"  (1.575 m)  Wt 111 lb (50.349 kg)  BMI 20.30 kg/m2  SpO2 94% Physical Exam  Nursing note and vitals reviewed. Constitutional: She is oriented to person, place, and time. She appears well-developed and well-nourished. No distress.  HENT:  Head: Normocephalic and atraumatic.  Right Ear: Tympanic membrane and ear canal normal.  Left Ear: Tympanic membrane and ear canal normal.  Mouth/Throat: Uvula is midline, oropharynx is clear and moist and mucous membranes are normal. No oropharyngeal exudate.  Eyes: EOM are normal. Pupils are equal, round, and reactive to light.  Neck: Normal range of motion, full passive range of motion without pain and phonation normal. Neck supple.  Cardiovascular: Normal rate, regular rhythm, normal heart sounds and intact distal pulses.   No murmur heard. Pulmonary/Chest: Effort normal. No stridor. No respiratory distress. She has wheezes. She has no rales. She exhibits no tenderness.  Diminished lung sounds throughout with inspiratory and expiratory wheezes that are worse in the left base. No rales or pursed lip breathing  Abdominal: Soft. She exhibits no distension. There is no tenderness. There is no rebound and no guarding.  Musculoskeletal: Normal range of motion. She exhibits no edema.  Lymphadenopathy:    She has no cervical adenopathy.  Neurological: She is alert and oriented to  person, place, and time. She exhibits normal muscle tone. Coordination normal.  Skin: Skin is warm and dry.  Psychiatric: She has a normal mood and affect. Her behavior is normal.    ED Course  Procedures (including critical care time) Labs Review Labs Reviewed  BASIC METABOLIC PANEL - Abnormal; Notable for the following:    Potassium 3.4 (*)    Glucose, Bld 138 (*)    All other components within normal limits  CBC WITH DIFFERENTIAL - Abnormal; Notable for the following:    Neutrophils Relative % 84 (*)    Neutro Abs 8.6 (*)    Lymphocytes Relative 11 (*)    All other components within normal limits   Imaging Review No results found.  EKG Interpretation   None       MDM   Previous ED chart reviewed by me  Patient seen here 2 days ago and treated for bronchitis. She is currently taking doxycycline and prednisone without relief. Chest x-ray from 2 days ago  showed COPD without acute findings.  Patient has diffuse expiratory wheezes throughout. She states symptoms have improved since receiving 2 albuterol nebs by EMS. Plan Will include repeat labs, nebs, and possible admission although patient states that she would prefer to be discharged home if possible.   1100  Patient discussed with EDP, Dr. Christy Gentles who assumes further management of the patient  Tomisha Reppucci L. Sagan Maselli, PA-C 04/14/13 1306

## 2013-04-14 NOTE — ED Notes (Signed)
Patient ambulated around the department.  o2 sat dropped to 91%.  Patient stated she was "very SOB" and upon going back to room, patient had to sit tripod on bed.

## 2013-04-14 NOTE — ED Notes (Signed)
Pt states she was here Friday for bronchitis. Was given doxy. States she started having diarrhea last night, worse today. Pt was given two albuterol breathing treatments by EMS prior to arrival

## 2013-04-14 NOTE — ED Notes (Addendum)
Upon return to room from ambulating to BR on RA, patient's saturations were 87%.  Dr. Christy Gentles made aware.

## 2013-04-14 NOTE — ED Provider Notes (Signed)
Pt with minimal improvement with repeat nebs Dyspneic/hypoxic after ambulating Will admit D/w dr Caryn Section, requests I start solumedrol and antibiotics  Sharyon Cable, MD 04/14/13 1523

## 2013-04-14 NOTE — ED Provider Notes (Signed)
Medical screening examination/treatment/procedure(s) were conducted as a shared visit with non-physician practitioner(s) and myself.  I personally evaluated the patient during the encounter.  EKG Interpretation   None         Sharyon Cable, MD 04/14/13 [phone]

## 2013-04-14 NOTE — H&P (Signed)
[facility] History and Physical  Stacey Nash OIT:[id number] DOB: 22-May-1951 DOA: 04/14/2013  Referring physician: ED physician, Dr. Christy Gentles. PCP: Bronson Curb, PA-C  Specialists:  Chief Complaint: Shortness of breath and wheezing.  HPI: Stacey Nash is a 62 y.o. female with a history of COPD, who presents to the emergency department with a complaint of worsening shortness of breath, wheezing, and productive cough with yellow sputum. The patient presented to the emergency department a couple days ago with the same symptoms. She was treated with nebulizers and discharged to home on a prednisone taper, doxycycline, and albuterol nebulizer. She felt better temporarily, but began to feel worse. After she took each dose of the doxycycline, she felt as if she was becoming more short of breath. Also, she developed loose stools after taking doxycycline. She denies black tarry stools or bright red blood in her stools. She denies chest pain or pleurisy. She denies blood in her sputum. She had subjective fever and chills last week, but not over the past 2 days. She smokes approximately half a pack of cigarettes per day, but she has been unable to smoke but 1 or 2 cigarettes over the last couple days. She had been oxygen dependent in the remote past, but her oxygen levels improved, and oxygen was discontinued years ago.  In the emergency department, and she is mildly tachycardic and mildly hypertensive. She is oxygenating 94% on nasal cannula oxygen. When she was ambulated on room air, her oxygen saturation fell to 86%. Her chest x-ray reveals findings of COPD but no acute cardiopulmonary process. Her ABG on 2 L of oxygen reveals a pH of 7.4, PCO2 of 35, and PO2 of 74. Her serum potassium is 3.4. Her white blood cell count is within normal limits. She is being admitted for further evaluation and management.   Review of Systems: As above in history present illness. In addition, she has  chronic neck pain.  Past Medical History  Diagnosis Date  . Diverticula of intestine   . COPD (chronic obstructive pulmonary disease)   . Allergy or intolerance to drug     Doxycycline caused worsening shortness of breath and loose stools.  . Chronic neck pain   . Tobacco abuse    Past Surgical History  Procedure Laterality Date  . Eye surgery    . Laparoscopic appendectomy  07/14/2011    Procedure: APPENDECTOMY LAPAROSCOPIC;  Surgeon: Donato Heinz, MD;  Location: [facility];  Service: General;  Laterality: N/A;   Social History: She is divorced. She lives in [place]. One of her sons live with her. She has 3 sons in all. She smokes 6 or 7 cigarettes daily, down from 1-1/2 packs of cigarettes per day for many years. She denies alcohol and illicit drug use.    Allergies  Allergen Reactions  . Sulfa Antibiotics Shortness Of Breath  . Doxycycline Diarrhea    Family history: Her mother is 83 years of age and has a history of diverticulosis and hypertension. Her father is deceased. He had diabetes mellitus. She is not sure what he died of.  Prior to Admission medications   Medication Sig Start Date End Date Taking? Authorizing Provider  albuterol (PROVENTIL) (2.5 MG/3ML) 0.083% nebulizer solution Take 2.5 mg by nebulization every 6 (six) hours as needed for wheezing or shortness of breath.   Yes Historical Provider, MD  Aspirin-Acetaminophen (GOODYS BODY PAIN PO) Take 0.5 packets by mouth daily as needed (headache). Pain   Yes Historical Provider,  MD  beclomethasone (QVAR) 80 MCG/ACT inhaler Inhale 2 puffs into the lungs 2 (two) times daily.    Yes Historical Provider, MD  doxycycline (VIBRAMYCIN) 100 MG capsule Take 1 capsule (100 mg total) by mouth 2 (two) times daily. 04/12/13  Yes Teressa Lower, MD  predniSONE (DELTASONE) 20 MG tablet Take 3 tablets (60 mg total) by mouth daily. 04/12/13  Yes Teressa Lower, MD  tiotropium (SPIRIVA HANDIHALER) 18 MCG inhalation capsule Place 18 mcg  into inhaler and inhale daily.   Yes Historical Provider, MD   Physical Exam: Filed Vitals:   04/14/13 1705  BP: 146/73  Pulse: 119  Temp: 97.9 F (36.6 C)  Resp: 16   oxygen saturation 92% on 2 L of nasal cannula oxygen.   General:  Pleasant small framed 62 year old woman sitting up in bed, in no acute distress.  Eyes: Pupils are equal, round, and reactive to light. Extraocular was are intact. Conjunctivae are clear. Sclerae are white.  ENT: Oropharynx reveals dry mucous membranes. Several missing teeth. Nasal mucosa is dry without rhinorrhea.  Neck: Neck is supple, no adenopathy, no thyromegaly, no JVD.  Cardiovascular: S1, S2, with mild tachycardia.  Respiratory: Scattered expiratory wheezes and occasional rhonchi in the bases. Breathing is nonlabored at rest.  Abdomen: Positive bowel sounds, soft, nontender, nondistended.  Skin: Fair turgor. No rashes.  Musculoskeletal/extremities: Pedal pulses palpable. No pedal edema. No acute hot red joints.  Psychiatric: Pleasant affect. She is alert and oriented x3. Her speech is clear.  Neurologic: She is hard of hearing. Otherwise, her cranial nerves are intact. Strength is globally 5 over 5 throughout. Sensation is grossly intact.  Labs on Admission:  Basic Metabolic Panel:  Recent Labs Lab 04/12/13 0359 04/14/13 1007  NA 132* 137  K 4.2 3.4*  CL 95* 100  CO2 25 23  GLUCOSE 104* 138*  BUN 7 10  CREATININE 0.66 0.57  CALCIUM 9.6 9.4   Liver Function Tests: No results found for this basename: AST, ALT, ALKPHOS, BILITOT, PROT, ALBUMIN,  in the last 168 hours No results found for this basename: LIPASE, AMYLASE,  in the last 168 hours No results found for this basename: AMMONIA,  in the last 168 hours CBC:  Recent Labs Lab 04/12/13 0359 04/14/13 1007  WBC 4.8 10.2  NEUTROABS  --  8.6*  HGB 15.0 14.8  HCT 44.4 43.5  MCV 88.4 87.7  PLT 251 302   Cardiac Enzymes: No results found for this basename: CKTOTAL,  CKMB, CKMBINDEX, TROPONINI,  in the last 168 hours  BNP (last 3 results) No results found for this basename: PROBNP,  in the last 8760 hours CBG: No results found for this basename: GLUCAP,  in the last 168 hours  Radiological Exams on Admission: No results found.  EKG: Not ordered.  Assessment/Plan Principal Problem:   COPD exacerbation Active Problems:   Acute respiratory failure with hypoxia   Tobacco abuse   Hyperglycemia, drug-induced   Hypokalemia   Allergy or intolerance to drug   1. This is a 62 year old woman with a [place] history of oxygen-dependent COPD which apparently resolved, who presents with failed outpatient treatment for acute COPD exacerbation/acute bronchitic exacerbation. Her ABG is reassuring on oxygen. However, she was mildly hypoxic on room air in the ED. She will be admitted for treatment with IV Solu-Medrol, albuterol/Atrovent nebulizers, IV Levaquin, and supportive treatment. We'll continue oxygen titrated to keep her oxygen saturations greater than 92%. We'll supplement her potassium chloride with oral potassium. Will provide gentle  IV fluids. We'll at sliding scale NovoLog for steroid-induced hyperglycemia. The patient was strongly advised to stop smoking.    Code Status: Full code Family Communication: No family available Disposition Plan: Discharge to home when clinically appropriate.  Time spent: One hour.  [facility] Pager [phone]  If 7PM-7AM, please contact night-coverage [email or web address] Password [email or web address] 04/14/2013, 5:13 PM

## 2013-04-14 NOTE — ED Provider Notes (Signed)
CSN: [id number]     Arrival date & time 04/12/13  0201 History   First MD Initiated Contact with Patient 04/12/13 0229     Chief Complaint  Patient presents with  . Fever  . Cough  . Facial Pain   (Consider location/radiation/quality/duration/timing/severity/associated sxs/prior Treatment) HPI History provided by patient. Onset of symptoms a few days ago, has history of COPD. Now with cough congestion and sinus pressure. No hemoptysis or productive sputum. Sick contacts at home with URI symptoms. No leg pain or leg swelling. No chest pain. Using inhaler with minimal relief. No fevers or chills. Symptoms moderate in severity. Past Medical History  Diagnosis Date  . Diverticula of intestine   . COPD (chronic obstructive pulmonary disease)    Past Surgical History  Procedure Laterality Date  . Eye surgery    . Laparoscopic appendectomy  07/14/2011    Procedure: APPENDECTOMY LAPAROSCOPIC;  Surgeon: Donato Heinz, MD;  Location: [facility];  Service: General;  Laterality: N/A;   History reviewed. No pertinent family history. History  Substance Use Topics  . Smoking status: Current Every Day Smoker    Types: Cigarettes  . Smokeless tobacco: Not on file  . Alcohol Use: No   OB History   Grav Para Term Preterm Abortions TAB SAB Ect Mult Living                 Review of Systems  Constitutional: Negative for fever and chills.  HENT: Positive for sinus pressure. Negative for facial swelling, sore throat, trouble swallowing and voice change.   Eyes: Negative for pain.  Respiratory: Positive for cough and wheezing.   Cardiovascular: Negative for chest pain.  Gastrointestinal: Negative for abdominal pain.  Genitourinary: Negative for dysuria.  Musculoskeletal: Negative for back pain, neck pain and neck stiffness.  Skin: Negative for rash.  Neurological: Negative for headaches.  All other systems reviewed and are negative.    Allergies  Sulfa antibiotics  Home Medications    Current Outpatient Rx  Name  Route  Sig  Dispense  Refill  . albuterol (PROVENTIL) (2.5 MG/3ML) 0.083% nebulizer solution   Nebulization   Take 2.5 mg by nebulization every 6 (six) hours as needed for wheezing or shortness of breath.         . Aspirin-Acetaminophen (GOODYS BODY PAIN PO)   Oral   Take 1 packet by mouth daily as needed. Pain         . beclomethasone (QVAR) 80 MCG/ACT inhaler   Inhalation   Inhale into the lungs 2 (two) times daily.         Marland Kitchen ibuprofen (ADVIL,MOTRIN) 800 MG tablet   Oral   Take 400 mg by mouth every 8 (eight) hours as needed. Pain         . tiotropium (SPIRIVA HANDIHALER) 18 MCG inhalation capsule   Inhalation   Place 18 mcg into inhaler and inhale daily.         . vitamin C (ASCORBIC ACID) 500 MG tablet   Oral   Take 500 mg by mouth daily.         Marland Kitchen doxycycline (VIBRAMYCIN) 100 MG capsule   Oral   Take 1 capsule (100 mg total) by mouth 2 (two) times daily.   20 capsule   0   . predniSONE (DELTASONE) 20 MG tablet   Oral   Take 3 tablets (60 mg total) by mouth daily.   15 tablet   0    BP 154/76  Pulse  112  Temp(Src) 98.3 F (36.8 C) (Oral)  Resp 16  Ht 5' 2"  (1.575 m)  Wt 111 lb 1 oz (50.378 kg)  BMI 20.31 kg/m2  SpO2 93% Physical Exam  Constitutional: She is oriented to person, place, and time. She appears well-developed and well-nourished.  HENT:  Head: Normocephalic and atraumatic.  Mouth/Throat: Oropharynx is clear and moist. No oropharyngeal exudate.  Oral nasopharynx clear, no tenderness over sinuses  Eyes: EOM are normal. Pupils are equal, round, and reactive to light.  Neck: Neck supple. No tracheal deviation present.  Cardiovascular: Regular rhythm and intact distal pulses.   Tachycardic 110s  Pulmonary/Chest: Effort normal. No stridor. No respiratory distress.  Mildly coarse bilat breath sounds  Abdominal: Soft. She exhibits no distension. There is no tenderness.  Musculoskeletal: Normal range of  motion. She exhibits no edema.  No calf tenderness, no cords or erythema to lower extremities  Lymphadenopathy:    She has no cervical adenopathy.  Neurological: She is alert and oriented to person, place, and time.  Skin: Skin is warm and dry.    ED Course  Procedures (including critical care time) Labs Review Labs Reviewed  BASIC METABOLIC PANEL - Abnormal; Notable for the following:    Sodium 132 (*)    Chloride 95 (*)    Glucose, Bld 104 (*)    All other components within normal limits  CBC   Imaging Review Dg Chest 2 View  04/12/2013   CLINICAL DATA:  Fever, cough and congestion.  EXAM: CHEST  2 VIEW  COMPARISON:  Chest radiograph performed 10/06/2011  FINDINGS: The lungs are hyperexpanded, with flattening of the hemidiaphragms, compatible with COPD. No definite focal consolidation, pleural effusion or pneumothorax is seen.  The heart is normal in size; the mediastinal contour is within normal limits. No acute osseous abnormalities are seen.  IMPRESSION: Findings of COPD; no acute cardiopulmonary process seen.   Electronically Signed   By: Garald Balding M.D.   On: 04/12/2013 04:43   Albuterol, prednisone, and antibiotics provided Zofran for nausea Recheck after being treatment is feeling much better and requesting to be discharged home. Chest x-ray results shared with patient. She is unable to tolerate Levaquin, doxycycline provided. Repeat albuterol treatment provided and patient states she is significantly better, declines admission. She agrees to close outpatient followup. She agrees to strict return precautions.  MDM   1. Bronchitis    Evaluated with labs and imaging reviewed as above. Improved with medications provided Vital signs nurse's notes reviewed and considered   Teressa Lower, MD 04/14/13 [phone]

## 2013-04-14 NOTE — Progress Notes (Signed)
Pt states she takes one Qvar 1 puff twice a daily.Marland KitchenMarland Kitchen

## 2013-04-15 LAB — BASIC METABOLIC PANEL
BUN: 8 mg/dL (ref 6–23)
Calcium: 9 mg/dL (ref 8.4–10.5)
Creatinine, Ser: 0.53 mg/dL (ref 0.50–1.10)
GFR calc Af Amer: >90 mL/min (ref >90)
GFR calc non Af Amer: >90 mL/min (ref >90)
Potassium: 3.9 mEq/L (ref 3.5–5.1)

## 2013-04-15 LAB — TSH: TSH: 0.179 u[IU]/mL — ABNORMAL LOW (ref 0.350–4.500)

## 2013-04-15 LAB — HEPATIC FUNCTION PANEL
Albumin: 3.7 g/dL (ref 3.5–5.2)
Total Bilirubin: 0.4 mg/dL (ref 0.3–1.2)
Total Protein: 6.6 g/dL (ref 6.0–8.3)

## 2013-04-15 LAB — GLUCOSE, CAPILLARY
Glucose-Capillary: 142 mg/dL — ABNORMAL HIGH (ref 70–99)
Glucose-Capillary: 142 mg/dL — ABNORMAL HIGH (ref 70–99)
Glucose-Capillary: 173 mg/dL — ABNORMAL HIGH (ref 70–99)

## 2013-04-15 LAB — CBC
MCHC: 33.3 g/dL (ref 30.0–36.0)
Platelets: 284 10*3/uL (ref 150–400)
RBC: 4.62 MIL/uL (ref 3.87–5.11)
RDW: 13.4 % (ref 11.5–15.5)

## 2013-04-15 LAB — MAGNESIUM: Magnesium: 2.3 mg/dL (ref 1.5–2.5)

## 2013-04-15 MED ORDER — METHYLPREDNISOLONE SODIUM SUCC 40 MG IJ SOLR
40.0000 mg | Freq: Four times a day (QID) | INTRAMUSCULAR | Status: DC
  Administered 2013-04-15 – 2013-04-16 (×4): 40 mg via INTRAVENOUS
  Filled 2013-04-15 (×4): qty 1

## 2013-04-15 MED ORDER — BENZONATATE 100 MG PO CAPS
100.0000 mg | ORAL_CAPSULE | Freq: Three times a day (TID) | ORAL | Status: DC | PRN
  Administered 2013-04-16: 100 mg via ORAL
  Filled 2013-04-15 (×2): qty 1

## 2013-04-15 NOTE — Progress Notes (Signed)
[facility] PROGRESS NOTE  Stacey Nash UJW:[id number] DOB: [birth year] DOA: 04/14/2013 PCP: Bronson Curb, PA-C    Code Status: FULL CODE Family Communication: Discussed with her son. Disposition Plan: Discharge to home when clinically appropriate.   Consultants:  None  Procedures:  None  Antibiotics:  IV Levaquin 04/14/2013>>>  HPI/Subjective: The patient says that she is breathing better. She has less chest congestion and less coughing. However, she complains of overall nausea. She believes this the multiple medications she is taking which "I am not used to". She has not requested Zofran yet. She denies abdominal pain.    Objective: Filed Vitals:   04/15/13 0506  BP: 165/83  Pulse:   Temp: 97.6 F (36.4 C)  Resp: 20   pulse 103-126. Oxygen saturation 92% on 2 L of oxygen.    Intake/Output Summary (Last 24 hours) at 04/15/13 1417 Last data filed at 04/15/13 1312  Gross per 24 hour  Intake    523 ml  Output   2000 ml  Net  -1477 ml   Filed Weights   04/14/13 0956 04/14/13 1705 04/15/13 0506  Weight: 50.349 kg (111 lb) 48.58 kg (107 lb 1.6 oz) 49.3 kg (108 lb 11 oz)    Exam:   General:  Pleasant alert small framed 62 year old woman sitting up in bed, in no acute distress.  Cardiovascular: S1, S2, with borderline tachycardia.  Respiratory: Mild diffuse scattered wheezes and crackles; less rhonchi and bronchospasms compared to last night.  Abdomen: Positive bowel sounds, soft, nontender, nondistended.  Musculoskeletal/extremities: No pedal edema. Pedal pulses palpable. No acute hot joints.   Data Reviewed: Basic Metabolic Panel:  Recent Labs Lab 04/12/13 0359 04/14/13 1007 04/15/13 0457  NA 132* 137 138  K 4.2 3.4* 3.9  CL 95* 100 104  CO2 25 23 24   GLUCOSE 104* 138* 133*  BUN 7 10 8   CREATININE 0.66 0.57 0.53  CALCIUM 9.6 9.4 9.0  MG  --   --  2.3   Liver Function Tests:  Recent Labs Lab 04/15/13 0457  AST 15   ALT 13  ALKPHOS 75  BILITOT 0.4  PROT 6.6  ALBUMIN 3.7   No results found for this basename: LIPASE, AMYLASE,  in the last 168 hours No results found for this basename: AMMONIA,  in the last 168 hours CBC:  Recent Labs Lab 04/12/13 0359 04/14/13 1007 04/15/13 0457  WBC 4.8 10.2 8.2  NEUTROABS  --  8.6*  --   HGB 15.0 14.8 13.5  HCT 44.4 43.5 40.5  MCV 88.4 87.7 87.7  PLT 251 302 284   Cardiac Enzymes: No results found for this basename: CKTOTAL, CKMB, CKMBINDEX, TROPONINI,  in the last 168 hours BNP (last 3 results) No results found for this basename: PROBNP,  in the last 8760 hours CBG:  Recent Labs Lab 04/14/13 2129 04/15/13 0734 04/15/13 1113  GLUCAP 150* 142* 142*    No results found for this or any previous visit (from the past 240 hour(s)).   Studies: No results found.  Scheduled Meds: . albuterol  2.5 mg Nebulization Q4H  . beclomethasone  2 puff Inhalation BID  . benzonatate  100 mg Oral TID  . enoxaparin (LOVENOX) injection  40 mg Subcutaneous Q24H  . famotidine  20 mg Oral QHS  . insulin aspart  0-15 Units Subcutaneous TID WC  . insulin aspart  0-5 Units Subcutaneous QHS  . insulin glargine  10 Units Subcutaneous QHS  . ipratropium  0.5 mg Nebulization  Q4H  . levofloxacin (LEVAQUIN) IV  500 mg Intravenous Q24H  . methylPREDNISolone (SOLU-MEDROL) injection  60 mg Intravenous Q6H  . potassium chloride  20 mEq Oral BID   Continuous Infusions: . 0.9 % NaCl with KCl 20 mEq / L 70 mL/hr at 04/15/13 0847    Assessment:  Principal Problem:   COPD exacerbation Active Problems:   Acute respiratory failure with hypoxia   Tobacco abuse   Hyperglycemia, drug-induced   Hypokalemia   Allergy or intolerance to drug   1. COPD with acute exacerbation. She is currently stable. We'll continue albuterol/Atrovent nebulizers, IV Solu-Medrol, and IV Levaquin. We'll decrease the Solu-Medrol and follow. Continue supportive treatment with oxygen and Tessalon  Perles.  Acute respiratory failure with hypoxia. This is secondary to COPD with exacerbation. It is yet to be seen if she will need home oxygen. We'll continue treating her COPD exacerbation and then reassess her oxygen saturation on room air closer to discharge.  Tobacco abuse. The patient was strongly advised to stop smoking.  Hyperglycemia., Steroid induced. We'll continue sliding scale NovoLog.  Elevated blood pressure. She has no history of hypertension. Her elevated blood pressure may be steroid induced. She may have underlying hypertension that was not diagnosed. We'll continue to monitor.  Hypokalemia. Supplement repleted. We'll discontinue supplementation.  Nausea. May be secondary to [facility] and/or potassium chloride. We'll continue supportive treatment with as needed Zofran. We'll discontinue potassium chloride and change Tessalon Perles 2 when necessary.  Intolerance/allergy to outpatient doxycycline.     Plan: 1. Will discontinue potassium chloride. We'll change Tessalon Perles to when necessary. 2. We'll decrease the dosing of Solu-Medrol. 3. We'll check her TSH pending. 4. Will ask the nursing staff to ambulate the patient with supplemental oxygen and to check her oxygen saturations daily. 5. Potential discharge in the next 48 hours.  Time spent: 30 minutes.    [facility] Pager [phone]. If 7PM-7AM, please contact night-coverage at [email or web address], password [facility] 04/15/2013, 2:17 PM  LOS: 1 day

## 2013-04-15 NOTE — Progress Notes (Signed)
Patient ambulated hallways on 2L O2 - stats 91-93%.  Tolerated well. Some SOB.

## 2013-04-15 NOTE — Progress Notes (Signed)
UR chart review completed.  

## 2013-04-15 NOTE — Care Management Note (Addendum)
    Page 1 of 2   04/16/2013     12:08:56 PM   CARE MANAGEMENT NOTE 04/16/2013  Patient:  GIZEL, RIEDLINGER   Account Number:  [id number]  Date Initiated:  04/15/2013  Documentation initiated by:  Theophilus Kinds  Subjective/Objective Assessment:   Pt admitted from home with COPD exacerbation. Pt lives with her son and will return home at discharge. Pt is independent with ADL's. Pt has used home O2 in the past with [facility].     Action/Plan:   Will monitor for possible need for home O2 prior to discharge.   Anticipated DC Date:  04/17/2013   Anticipated DC Plan:  [place]  CM consult      PAC Choice  DURABLE MEDICAL EQUIPMENT   Choice offered to / List presented to:  C-1 Patient   DME arranged  OXYGEN  NEBULIZER MACHINE      DME agency  [place].        Status of service:  Completed, signed off Medicare Important Message given?   (If response is "NO", the following Medicare IM given date fields will be blank) Date Medicare IM given:   Date Additional Medicare IM given:    Discharge Disposition:  HOME/SELF CARE  Per UR Regulation:    If discussed at Long Length of Stay Meetings, dates discussed:    Comments:  04/16/13 [place], RN BSN CM Pt discharged home today with home O2 and nebs. Pt chose [facility] for DME. Emma with [facility] notified of needs and portable will be delivered to pt prior to discharge and concentrator and neb machine will be delivered to pts home. No other Cm needs noted. Pt and pts nurse aware of discharge arrangements.  04/15/13 [place], RN BSN CM

## 2013-04-16 DIAGNOSIS — J449 Chronic obstructive pulmonary disease, unspecified: Secondary | ICD-10-CM

## 2013-04-16 LAB — GLUCOSE, CAPILLARY

## 2013-04-16 MED ORDER — IPRATROPIUM BROMIDE 0.02 % IN SOLN: 0.5000 mg | RESPIRATORY_TRACT | Status: DC | PRN

## 2013-04-16 MED ORDER — ALBUTEROL SULFATE (5 MG/ML) 0.5% IN NEBU: 2.5000 mg | INHALATION_SOLUTION | RESPIRATORY_TRACT | Status: DC | PRN

## 2013-04-16 MED ORDER — LEVOFLOXACIN 750 MG PO TABS: 750.0000 mg | ORAL_TABLET | Freq: Every day | ORAL | Status: DC

## 2013-04-16 MED ORDER — ALPRAZOLAM 0.25 MG PO TABS: 0.2500 mg | ORAL_TABLET | Freq: Three times a day (TID) | ORAL | Status: DC | PRN

## 2013-04-16 MED ORDER — PREDNISONE (PAK) 10 MG PO TABS: ORAL_TABLET | ORAL | Status: DC

## 2013-04-16 MED ORDER — BENZONATATE 100 MG PO CAPS: 100.0000 mg | ORAL_CAPSULE | Freq: Three times a day (TID) | ORAL | Status: DC | PRN

## 2013-04-16 NOTE — Progress Notes (Signed)
PHARMACIST - PHYSICIAN COMMUNICATION DR:   Caryn Section  CONCERNING: Antibiotic IV to Oral Route Change Policy  RECOMMENDATION: This patient is receiving Levaquin by the intravenous route.  Based on criteria approved by the Pharmacy and Therapeutics Committee, the antibiotic(s) is/are being converted to the equivalent oral dose form(s).   DESCRIPTION: These criteria include:  Patient being treated for a respiratory tract infection, urinary tract infection, cellulitis or clostridium difficile associated diarrhea if on metronidazole  The patient is not neutropenic and does not exhibit a GI malabsorption state  The patient is eating (either orally or via tube) and/or has been taking other orally administered medications for a least 24 hours  The patient is improving clinically and has a Tmax < 100.5  If you have questions about this conversion, please contact the Pharmacy Department  [x]   [phone] )  [facility] []   [phone] )  [facility]  []   [phone] )  [facility] []   [phone] )  [facility]   S. [place] Crane, PharmD

## 2013-04-16 NOTE — Progress Notes (Signed)
Pt is to be discharged home today. Pt is in NAD, IV is out, all paperwork has been reviewed/discussed with patient, and there are no questions/concerns at this time. Assessment is unchanged from this morning. Pt is to be accompanied downstairs by staff and family via wheelchair.  

## 2013-04-16 NOTE — Discharge Summary (Signed)
Physician Discharge Summary  Stacey Nash MOL:[id number] DOB: 12/04/1950 DOA: 04/14/2013  PCP: Bronson Curb, PA-C  Admit date: 04/14/2013 Discharge date: 04/16/2013  Time spent: 35 minutes  Recommendations for Outpatient Follow-up:  1. Please followup on patient's blood sugars as she was administered steroids 2. Followup on O2 sats, patient being discharged on home oxygen  Discharge Diagnoses:  Principal Problem:   COPD exacerbation Active Problems:   Hyperglycemia, drug-induced   Acute respiratory failure with hypoxia   Hypokalemia   Allergy or intolerance to drug   Tobacco abuse   Discharge Condition: Stable/improved  Diet recommendation: Heart healthy  Filed Weights   04/14/13 0956 04/14/13 1705 04/15/13 0506  Weight: 50.349 kg (111 lb) 48.58 kg (107 lb 1.6 oz) 49.3 kg (108 lb 11 oz)    History of present illness:  Stacey Nash is a 62 y.o. female with a history of COPD, who presents to the emergency department with a complaint of worsening shortness of breath, wheezing, and productive cough with yellow sputum. The patient presented to the emergency department a couple days ago with the same symptoms. She was treated with nebulizers and discharged to home on a prednisone taper, doxycycline, and albuterol nebulizer. She felt better temporarily, but began to feel worse. After she took each dose of the doxycycline, she felt as if she was becoming more short of breath. Also, she developed loose stools after taking doxycycline. She denies black tarry stools or bright red blood in her stools. She denies chest pain or pleurisy. She denies blood in her sputum. She had subjective fever and chills last week, but not over the past 2 days. She smokes approximately half a pack of cigarettes per day, but she has been unable to smoke but 1 or 2 cigarettes over the last couple days. She had been oxygen dependent in the remote past, but her oxygen levels improved, and oxygen  was discontinued years ago.  In the emergency department, and she is mildly tachycardic and mildly hypertensive. She is oxygenating 94% on nasal cannula oxygen. When she was ambulated on room air, her oxygen saturation fell to 86%. Her chest x-ray reveals findings of COPD but no acute cardiopulmonary process. Her ABG on 2 L of oxygen reveals a pH of 7.4, PCO2 of 35, and PO2 of 74. Her serum potassium is 3.4. Her white blood cell count is within normal limits. She is being admitted for further evaluation and management.   Hospital Course:  Patient is a pleasant 62 year old female with a past medical history of tobacco abuse, chronic obstructive pulmonary disease, admitted to the medicine service on 04/14/2013 at which time she presented with increasing shortness of breath and wheezing. She was administered IV steroids, started on empiric antibiotic therapy, administered due to meds every 4 hours. Chest x-ray performed on 04/12/2013 showed hyperexpanded lungs with flattening of hemidiaphragms, no acute cardiopulmonary disease was seen. She clinically improved and by 04/16/2013 stated feeling ready for discharge. Her room air O2 sats were checked, back at 81%, improving to 93 on 2 L supplemental oxygen. Patient had reported utilizing home oxygen in the past. Case manager was consulted for setting patient up with home oxygen, continuous, 2 L. Otherwise she was discharged in stable condition on 04/16/2013, with a steroid taper, continuation of her antibiotics, home oxygen as well as duo nebs.   Consultations:  Case manager  Discharge Exam: Filed Vitals:   04/16/13 0518  BP: 158/77  Pulse: 95  Temp: 98.5 F (36.9 C)  Resp: 20    General: Patient reports doing much better, she is sitting up working on a crossword puzzle, asking to go home today Cardiovascular: Regular rate and rhythm normal S1-S2 no murmurs rubs or gallops Respiratory: Patient having severely diminished breath sounds bilaterally,  few respiratory wheezes, otherwise normal respiratory effort Abdomen: Soft nontender nondistended Extremities: No edema  Discharge Instructions  Discharge Orders   Future Orders Complete By Expires   Call MD for:  extreme fatigue  As directed    Call MD for:  persistant dizziness or light-headedness  As directed    Call MD for:  persistant nausea and vomiting  As directed    Call MD for:  temperature >100.4  As directed    Diet - low sodium heart healthy  As directed    Increase activity slowly  As directed        Medication List    STOP taking these medications       albuterol (2.5 MG/3ML) 0.083% nebulizer solution  Commonly known as:  PROVENTIL  Replaced by:  albuterol (5 MG/ML) 0.5% nebulizer solution     doxycycline 100 MG capsule  Commonly known as:  VIBRAMYCIN     GOODYS BODY PAIN PO     predniSONE 20 MG tablet  Commonly known as:  DELTASONE  Replaced by:  predniSONE 10 MG tablet      TAKE these medications       albuterol (5 MG/ML) 0.5% nebulizer solution  Commonly known as:  PROVENTIL  Take 0.5 mLs (2.5 mg total) by nebulization every 4 (four) hours as needed for wheezing or shortness of breath.     ALPRAZolam 0.25 MG tablet  Commonly known as:  XANAX  Take 1 tablet (0.25 mg total) by mouth 3 (three) times daily as needed for anxiety or sleep.     benzonatate 100 MG capsule  Commonly known as:  TESSALON  Take 1 capsule (100 mg total) by mouth 3 (three) times daily as needed for cough.     ipratropium 0.02 % nebulizer solution  Commonly known as:  ATROVENT  Take 2.5 mLs (0.5 mg total) by nebulization every 4 (four) hours as needed for wheezing or shortness of breath.     levofloxacin 750 MG tablet  Commonly known as:  LEVAQUIN  Take 1 tablet (750 mg total) by mouth daily at 6 PM.     predniSONE 10 MG tablet  Commonly known as:  STERAPRED UNI-PAK  Take 6 tabs PO daily for 4 days, then 4 tabs PO daily for 4 days, then 3 tabs PO daily for 4 days, then 2  tabs PO daily for 4 days, then 1 tab PO daily for 4 days     QVAR 80 MCG/ACT inhaler  Generic drug:  beclomethasone  Inhale 2 puffs into the lungs 2 (two) times daily.     SPIRIVA HANDIHALER 18 MCG inhalation capsule  Generic drug:  tiotropium  Place 18 mcg into inhaler and inhale daily.       Allergies  Allergen Reactions  . Sulfa Antibiotics Shortness Of Breath  . Doxycycline Diarrhea       Follow-up Information   Follow up with ROBERTSON, ANTHONY T, PA-C In 1 week.   Specialty:  Physician Assistant   Contact information:   [address] [place] [address] [phone]        The results of significant diagnostics from this hospitalization (including imaging, microbiology, ancillary and laboratory) are listed below for reference.  Significant Diagnostic Studies: Dg Chest 2 View  04/12/2013   CLINICAL DATA:  Fever, cough and congestion.  EXAM: CHEST  2 VIEW  COMPARISON:  Chest radiograph performed 10/06/2011  FINDINGS: The lungs are hyperexpanded, with flattening of the hemidiaphragms, compatible with COPD. No definite focal consolidation, pleural effusion or pneumothorax is seen.  The heart is normal in size; the mediastinal contour is within normal limits. No acute osseous abnormalities are seen.  IMPRESSION: Findings of COPD; no acute cardiopulmonary process seen.   Electronically Signed   By: Garald Balding M.D.   On: 04/12/2013 04:43    Microbiology: No results found for this or any previous visit (from the past 240 hour(s)).   Labs: Basic Metabolic Panel:  Recent Labs Lab 04/12/13 0359 04/14/13 1007 04/15/13 0457  NA 132* 137 138  K 4.2 3.4* 3.9  CL 95* 100 104  CO2 25 23 24   GLUCOSE 104* 138* 133*  BUN 7 10 8   CREATININE 0.66 0.57 0.53  CALCIUM 9.6 9.4 9.0  MG  --   --  2.3   Liver Function Tests:  Recent Labs Lab 04/15/13 0457  AST 15  ALT 13  ALKPHOS 75  BILITOT 0.4  PROT 6.6  ALBUMIN 3.7   No results found for this basename:  LIPASE, AMYLASE,  in the last 168 hours No results found for this basename: AMMONIA,  in the last 168 hours CBC:  Recent Labs Lab 04/12/13 0359 04/14/13 1007 04/15/13 0457  WBC 4.8 10.2 8.2  NEUTROABS  --  8.6*  --   HGB 15.0 14.8 13.5  HCT 44.4 43.5 40.5  MCV 88.4 87.7 87.7  PLT 251 302 284   Cardiac Enzymes: No results found for this basename: CKTOTAL, CKMB, CKMBINDEX, TROPONINI,  in the last 168 hours BNP: BNP (last 3 results) No results found for this basename: PROBNP,  in the last 8760 hours CBG:  Recent Labs Lab 04/15/13 1113 04/15/13 1636 04/15/13 2143 04/16/13 0729 04/16/13 1152  GLUCAP 142* 173* 120* 117* 120*       Signed:  Evellyn Tuff  [facility] 04/16/2013, 12:51 PM

## 2013-04-16 NOTE — Progress Notes (Signed)
Patient's O2 sat was checked on room air at rest, with a reading of 81%. I have placed order for home oxygen 2L continuous.

## 2013-12-30 ENCOUNTER — Encounter (HOSPITAL_COMMUNITY): Payer: Self-pay | Admitting: Emergency Medicine

## 2013-12-30 ENCOUNTER — Emergency Department (HOSPITAL_COMMUNITY): Payer: Medicaid Other

## 2013-12-30 ENCOUNTER — Emergency Department (HOSPITAL_COMMUNITY)
Admission: EM | Admit: 2013-12-30 | Discharge: 2013-12-30 | Disposition: A | Discharge status: Home / Self Care | Payer: Medicaid Other | Attending: Emergency Medicine | Admitting: Emergency Medicine

## 2013-12-30 DIAGNOSIS — Z87891 Personal history of nicotine dependence: Secondary | ICD-10-CM | POA: Insufficient documentation

## 2013-12-30 DIAGNOSIS — J159 Unspecified bacterial pneumonia: Secondary | ICD-10-CM | POA: Diagnosis not present

## 2013-12-30 DIAGNOSIS — J441 Chronic obstructive pulmonary disease with (acute) exacerbation: Secondary | ICD-10-CM | POA: Insufficient documentation

## 2013-12-30 DIAGNOSIS — J189 Pneumonia, unspecified organism: Secondary | ICD-10-CM

## 2013-12-30 DIAGNOSIS — Z79899 Other long term (current) drug therapy: Secondary | ICD-10-CM | POA: Insufficient documentation

## 2013-12-30 DIAGNOSIS — R0602 Shortness of breath: Secondary | ICD-10-CM | POA: Diagnosis present

## 2013-12-30 DIAGNOSIS — IMO0002 Reserved for concepts with insufficient information to code with codable children: Secondary | ICD-10-CM | POA: Diagnosis not present

## 2013-12-30 DIAGNOSIS — Z8719 Personal history of other diseases of the digestive system: Secondary | ICD-10-CM | POA: Insufficient documentation

## 2013-12-30 DIAGNOSIS — G8929 Other chronic pain: Secondary | ICD-10-CM | POA: Diagnosis not present

## 2013-12-30 LAB — CBC WITH DIFFERENTIAL/PLATELET
BASOS PCT: 1 % (ref 0–1)
Basophils Absolute: 0.1 10*3/uL (ref 0.0–0.1)
Eosinophils Absolute: 0.1 10*3/uL (ref 0.0–0.7)
Eosinophils Relative: 2 % (ref 0–5)
HCT: 38.3 % (ref 36.0–46.0)
Hemoglobin: 12.8 g/dL (ref 12.0–15.0)
Lymphocytes Relative: 21 % (ref 12–46)
Lymphs Abs: 1.3 10*3/uL (ref 0.7–4.0)
MCH: 29.6 pg (ref 26.0–34.0)
MCHC: 33.4 g/dL (ref 30.0–36.0)
MCV: 88.7 fL (ref 78.0–100.0)
MONOS PCT: 12 % (ref 3–12)
Monocytes Absolute: 0.7 10*3/uL (ref 0.1–1.0)
Neutro Abs: 4 10*3/uL (ref 1.7–7.7)
Neutrophils Relative %: 64 % (ref 43–77)
PLATELETS: 273 10*3/uL (ref 150–400)
RBC: 4.32 MIL/uL (ref 3.87–5.11)
RDW: 12.8 % (ref 11.5–15.5)
WBC: 6.3 10*3/uL (ref 4.0–10.5)

## 2013-12-30 LAB — BASIC METABOLIC PANEL
Anion gap: 14 (ref 5–15)
BUN: 7 mg/dL (ref 6–23)
CO2: 24 mEq/L (ref 19–32)
Calcium: 9.2 mg/dL (ref 8.4–10.5)
Chloride: 103 mEq/L (ref 96–112)
Creatinine, Ser: 0.58 mg/dL (ref 0.50–1.10)
GFR calc Af Amer: >90 mL/min (ref >90)
GFR calc non Af Amer: >90 mL/min (ref >90)
Glucose, Bld: 106 mg/dL — ABNORMAL HIGH (ref 70–99)
POTASSIUM: 3.9 meq/L (ref 3.7–5.3)
Sodium: 141 mEq/L (ref 137–147)

## 2013-12-30 MED ORDER — LEVOFLOXACIN 750 MG PO TABS: 750.0000 mg | ORAL_TABLET | Freq: Every day | ORAL | Status: DC

## 2013-12-30 MED ORDER — IPRATROPIUM-ALBUTEROL 0.5-2.5 (3) MG/3ML IN SOLN
3.0000 mL | Freq: Once | RESPIRATORY_TRACT | Status: AC
  Administered 2013-12-30: 3 mL via RESPIRATORY_TRACT
  Filled 2013-12-30: qty 3

## 2013-12-30 MED ORDER — AZITHROMYCIN 250 MG PO TABS: ORAL_TABLET | ORAL | Status: DC

## 2013-12-30 MED ORDER — AZITHROMYCIN 250 MG PO TABS: 250.0000 mg | ORAL_TABLET | Freq: Every day | ORAL | Status: DC

## 2013-12-30 NOTE — ED Notes (Signed)
Patient requesting Levaquin versus Zithromax due to the fact that Zithromax "tears my stomach up". MD aware and Levaquin Rx given. Zithromax Rx put in shred box.

## 2013-12-30 NOTE — ED Notes (Signed)
Sob, with cough green sputum .  Seen at [facility] and says her chest  x-ray showed fld   Pt on 02.2 L  Nausea.  Marland Kitchen

## 2013-12-30 NOTE — Discharge Instructions (Signed)

## 2013-12-30 NOTE — ED Notes (Signed)
MD at bedside. 

## 2013-12-30 NOTE — ED Provider Notes (Signed)
CSN: [id number]     Arrival date & time 12/30/13  1718 History   First MD Initiated Contact with Patient 12/30/13 1809     Chief Complaint  Patient presents with  . Shortness of Breath     (Consider location/radiation/quality/duration/timing/severity/associated sxs/prior Treatment) HPI 63 year old female presents with shortness of breath the last 2 days. She's developed a productive cough with green sputum over the last 24 hours. Has been having subjective fevers and chills. No chest pain, headaches, or leg swelling. Was seen at her PCPs office and had a chest x-ray that showed fluid around her lungs. Patient states that she was sent here for a second opinion as her PCP was uncomfortable starting her on antibiotics and sending her home. She is chronically on 2 L nasal cannula and has not had to increase this recently. Her son has been ill with similar symptoms recently.   Past Medical History  Diagnosis Date  . Diverticula of intestine   . COPD (chronic obstructive pulmonary disease)   . Allergy or intolerance to drug     Doxycycline caused worsening shortness of breath and loose stools.  . Chronic neck pain   . Tobacco abuse    Past Surgical History  Procedure Laterality Date  . Eye surgery    . Laparoscopic appendectomy  07/14/2011    Procedure: APPENDECTOMY LAPAROSCOPIC;  Surgeon: Donato Heinz, MD;  Location: [facility];  Service: General;  Laterality: N/A;  . Appendectomy     History reviewed. No pertinent family history. History  Substance Use Topics  . Smoking status: Former Smoker    Types: Cigarettes    Quit date: 09/29/2013  . Smokeless tobacco: Not on file  . Alcohol Use: No   OB History   Grav Para Term Preterm Abortions TAB SAB Ect Mult Living                 Review of Systems  Constitutional: Positive for fever and chills.  Respiratory: Positive for cough and shortness of breath.   Cardiovascular: Negative for chest pain and leg swelling.  Gastrointestinal:  Negative for vomiting.  All other systems reviewed and are negative.     Allergies  Sulfa antibiotics; Doxycycline; and Zofran  Home Medications   Prior to Admission medications   Medication Sig Start Date End Date Taking? Authorizing Provider  albuterol (PROVENTIL HFA;VENTOLIN HFA) 108 (90 BASE) MCG/ACT inhaler Inhale into the lungs every 6 (six) hours as needed for wheezing or shortness of breath.   Yes Historical Provider, MD  albuterol (PROVENTIL) (2.5 MG/3ML) 0.083% nebulizer solution Take 2.5 mg by nebulization every 6 (six) hours as needed for wheezing or shortness of breath.   Yes Historical Provider, MD  beclomethasone (QVAR) 80 MCG/ACT inhaler Inhale 1 puff into the lungs 2 (two) times daily.    Yes Historical Provider, MD  tiotropium (SPIRIVA HANDIHALER) 18 MCG inhalation capsule Place 18 mcg into inhaler and inhale every morning.    Yes Historical Provider, MD   BP 102/67  Pulse 104  Temp(Src) 99.5 F (37.5 C) (Oral)  Resp 16  SpO2 100% Physical Exam  Nursing note and vitals reviewed. Constitutional: She is oriented to person, place, and time. She appears well-developed and well-nourished. No distress.  HENT:  Head: Normocephalic and atraumatic.  Right Ear: External ear normal.  Left Ear: External ear normal.  Nose: Nose normal.  Eyes: Right eye exhibits no discharge. Left eye exhibits no discharge.  Cardiovascular: Normal rate, regular rhythm and normal heart sounds.  Pulmonary/Chest: Effort normal. She has rales (faint rales bilaterally).  Abdominal: Soft. There is no tenderness.  Musculoskeletal: She exhibits no edema.  Neurological: She is alert and oriented to person, place, and time.  Skin: Skin is warm and dry.    ED Course  Procedures (including critical care time) Labs Review Labs Reviewed  BASIC METABOLIC PANEL - Abnormal; Notable for the following:    Glucose, Bld 106 (*)    All other components within normal limits  CBC WITH DIFFERENTIAL     Imaging Review Dg Chest 2 View  12/30/2013   CLINICAL DATA:  Shortness of breath.  Cough.  COPD.  EXAM: CHEST  2 VIEW  COMPARISON:  12/30/2013  FINDINGS: Changes of COPD again demonstrated. Streaky opacity again seen within the lingula, suspicious for pneumonia. No other areas of pulmonary opacity identified. No evidence of pleural effusion. Heart size is normal. No definite mass or lymphadenopathy identified.  IMPRESSION: No significant change in streaky opacity within the lingula, suspicious for pneumonia. Recommend chest radiographic followup in several weeks to confirm resolution.  COPD.   Electronically Signed   By: Earle Gell M.D.   On: 12/30/2013 19:11     EKG Interpretation None      MDM   Final diagnoses:  Community acquired pneumonia    Patient feels significant improvement with 1 DuoNeb. Her x-ray is suspicious for a possible lingula pneumonia. Given this she will need antibiotics. She's requesting Levaquin at this time. She has no hypoxia, distress, or other concerning factors to warrant admission at this time. We'll treat as an outpatient with close PCP followup.    Ephraim Hamburger, MD 12/30/13 [id number]

## 2013-12-30 NOTE — ED Notes (Signed)
Patient with no complaints at this time. Respirations even and unlabored. Skin warm/dry. Discharge instructions reviewed with patient at this time. Patient given opportunity to voice concerns/ask questions. Patient discharged at this time and left Emergency Department with steady gait.   

## 2014-05-26 ENCOUNTER — Encounter (HOSPITAL_COMMUNITY): Payer: Self-pay | Admitting: *Deleted

## 2014-05-26 ENCOUNTER — Emergency Department (HOSPITAL_COMMUNITY)
Admission: EM | Admit: 2014-05-26 | Discharge: 2014-05-27 | Disposition: A | Discharge status: Home / Self Care | Payer: Medicare Other | Attending: Emergency Medicine | Admitting: Emergency Medicine

## 2014-05-26 DIAGNOSIS — Z87891 Personal history of nicotine dependence: Secondary | ICD-10-CM | POA: Insufficient documentation

## 2014-05-26 DIAGNOSIS — Z8719 Personal history of other diseases of the digestive system: Secondary | ICD-10-CM | POA: Insufficient documentation

## 2014-05-26 DIAGNOSIS — Z79899 Other long term (current) drug therapy: Secondary | ICD-10-CM | POA: Diagnosis not present

## 2014-05-26 DIAGNOSIS — G8929 Other chronic pain: Secondary | ICD-10-CM | POA: Diagnosis not present

## 2014-05-26 DIAGNOSIS — I10 Essential (primary) hypertension: Secondary | ICD-10-CM | POA: Insufficient documentation

## 2014-05-26 DIAGNOSIS — J449 Chronic obstructive pulmonary disease, unspecified: Secondary | ICD-10-CM | POA: Insufficient documentation

## 2014-05-26 LAB — CBC
HCT: 40.4 % (ref 36.0–46.0)
HEMOGLOBIN: 13.3 g/dL (ref 12.0–15.0)
MCH: 28.5 pg (ref 26.0–34.0)
MCHC: 32.9 g/dL (ref 30.0–36.0)
MCV: 86.7 fL (ref 78.0–100.0)
Platelets: 304 10*3/uL (ref 150–400)
RBC: 4.66 MIL/uL (ref 3.87–5.11)
RDW: 13.3 % (ref 11.5–15.5)
WBC: 7 10*3/uL (ref 4.0–10.5)

## 2014-05-26 MED ORDER — LABETALOL HCL 5 MG/ML IV SOLN
5.0000 mg | Freq: Once | INTRAVENOUS | Status: DC
  Filled 2014-05-26: qty 4

## 2014-05-26 NOTE — ED Notes (Signed)
Patient's BP has declined to 167/83. Dr. Betsey Holiday aware and gave verbal order to hold IV and medication.  Patient continues to deny any pain at this time and states she is feeling better.  Patient informed of plan of care and agrees.  Patient ambulatory with steady gait to bathroom.

## 2014-05-26 NOTE — ED Provider Notes (Signed)
CSN: [id number]     Arrival date & time 05/26/14  2207 History  This chart was scribed for Orpah Greek, MD by Martinique Peace, ED Scribe. The patient was seen in [place]. The patient's care was started at 11:23 PM.     Chief Complaint  Patient presents with  . Hypertension      Patient is a 63 y.o. female presenting with hypertension. The history is provided by the patient and a relative. No language interpreter was used.  Hypertension Pertinent negatives include no chest pain and no shortness of breath.    HPI Comments: Stacey Nash is a 63 y.o. female who presents to the Emergency Department complaining of hypertension. Pt reports that she began feeling "weird". She adds that she was experiencing numbness in her lips and right side of her face that made her believe her BP was shooting up. She checked BP and it was 200/113. Relative adds that pt is no longer on consistent medication for her BP due to complications with it fluctuating sometimes when she takes it. Because of this pt was advised to only take her medication whenever her BP gets above 150 or lower than her normal readings. Pt states that she took Lisinopril at 7:00 PM. She now reports these symptoms have since resolved. No complaints of SOB or chest pain.    Past Medical History  Diagnosis Date  . Diverticula of intestine   . COPD (chronic obstructive pulmonary disease)   . Allergy or intolerance to drug     Doxycycline caused worsening shortness of breath and loose stools.  . Chronic neck pain   . Tobacco abuse    Past Surgical History  Procedure Laterality Date  . Eye surgery    . Laparoscopic appendectomy  07/14/2011    Procedure: APPENDECTOMY LAPAROSCOPIC;  Surgeon: Donato Heinz, MD;  Location: [facility];  Service: General;  Laterality: N/A;  . Appendectomy     History reviewed. No pertinent family history. History  Substance Use Topics  . Smoking status: Former Smoker    Types: Cigarettes   Quit date: 09/29/2013  . Smokeless tobacco: Not on file  . Alcohol Use: No   OB History    No data available     Review of Systems  Respiratory: Negative for shortness of breath.   Cardiovascular: Negative for chest pain.       Hypertension.   All other systems reviewed and are negative.     Allergies  Sulfa antibiotics; Doxycycline; and Zofran  Home Medications   Prior to Admission medications   Medication Sig Start Date End Date Taking? Authorizing Provider  albuterol (PROVENTIL HFA;VENTOLIN HFA) 108 (90 BASE) MCG/ACT inhaler Inhale into the lungs every 6 (six) hours as needed for wheezing or shortness of breath.   Yes Historical Provider, MD  albuterol (PROVENTIL) (2.5 MG/3ML) 0.083% nebulizer solution Take 2.5 mg by nebulization every 6 (six) hours as needed for wheezing or shortness of breath.   Yes Historical Provider, MD  Aspirin-Acetaminophen-Caffeine (GOODY HEADACHE PO) Take 0.5-1 packets by mouth daily as needed (for pain).   Yes Historical Provider, MD  lisinopril (PRINIVIL,ZESTRIL) 10 MG tablet Take 5 mg by mouth daily.   Yes Historical Provider, MD  tiotropium (SPIRIVA HANDIHALER) 18 MCG inhalation capsule Place 18 mcg into inhaler and inhale every morning.    Yes Historical Provider, MD  levofloxacin (LEVAQUIN) 750 MG tablet Take 1 tablet (750 mg total) by mouth daily. X 5 days Patient not taking: Reported on  05/26/2014 12/30/13   Ephraim Hamburger, MD   BP 179/95 mmHg  Pulse 94  Temp(Src) 98 F (36.7 C) (Oral)  Resp 20  Ht 5' 2"  (1.575 m)  Wt 121 lb (54.885 kg)  BMI 22.13 kg/m2  SpO2 99% Physical Exam  Constitutional: She is oriented to person, place, and time. She appears well-developed and well-nourished. No distress.  HENT:  Head: Normocephalic and atraumatic.  Right Ear: Hearing normal.  Left Ear: Hearing normal.  Nose: Nose normal.  Mouth/Throat: Oropharynx is clear and moist and mucous membranes are normal.  Eyes: Conjunctivae and EOM are normal.  Pupils are equal, round, and reactive to light.  Neck: Normal range of motion. Neck supple.  Cardiovascular: Regular rhythm, S1 normal and S2 normal.  Exam reveals no gallop and no friction rub.   No murmur heard. Pulmonary/Chest: Effort normal and breath sounds normal. No respiratory distress. She exhibits no tenderness.  Abdominal: Soft. Normal appearance and bowel sounds are normal. There is no hepatosplenomegaly. There is no tenderness. There is no rebound, no guarding, no tenderness at McBurney's point and negative Murphy's sign. No hernia.  Musculoskeletal: Normal range of motion.  Neurological: She is alert and oriented to person, place, and time. She has normal strength. No cranial nerve deficit or sensory deficit. Coordination normal. GCS eye subscore is 4. GCS verbal subscore is 5. GCS motor subscore is 6.  Extraocular muscle movement: normal No visual field cut Pupils: equal and reactive both direct and consensual response is normal No nystagmus present    Sensory function is intact to light touch, pinprick Proprioception intact  Grip strength 5/5 symmetric in upper extremities Lower extremity strength 5/5 against gravity No pronator drift Normal finger to nose bilaterally Normal heel to shin bilaterally  Gait: normal    Skin: Skin is warm, dry and intact. No rash noted. No cyanosis.  Psychiatric: She has a normal mood and affect. Her speech is normal and behavior is normal. Thought content normal.  Nursing note and vitals reviewed.   ED Course  Procedures (including critical care time) Labs Review Labs Reviewed  BASIC METABOLIC PANEL - Abnormal; Notable for the following:    Glucose, Bld 115 (*)    All other components within normal limits  CBC    Imaging Review No results found.   EKG Interpretation None     Medications  labetalol (NORMODYNE,TRANDATE) injection 5 mg (not administered)    11:26 PM- Treatment plan was discussed with patient who verbalizes  understanding and agrees.   MDM   Final diagnoses:  None   hypertension  Patient has a previous history of hypertension. Her primary doctor had put her on lisinopril 10 mg and she was getting hypotensive. The medication was stopped and she was told to take half a tablet if her blood pressure gets over 150. Tonight she had some tingling of the face and checked her pressure and it was elevated. She did take 5 mg of lisinopril prior to coming to the ER and her blood pressure is improving. Workup has been unremarkable here in the ER. Neurologic exam is normal, no concern for stroke. Patient will have a new prescription for lisinopril 2.5 mg daily and follow-up with primary doctor.  I personally performed the services described in this documentation, which was scribed in my presence. The recorded information has been reviewed and is accurate.   Orpah Greek, MD 05/27/14 [id number]

## 2014-05-26 NOTE — ED Notes (Addendum)
"  my bp is shooting up".  200/113  "I feel weird".  No pain  Frequent belching at home  Took lisinopril at 7 pm. Takes it only when "my bp shoots up"  Alert, MAE, NAd at present.  Pt came into ER on 02

## 2014-05-27 LAB — BASIC METABOLIC PANEL
Anion gap: 9 (ref 5–15)
BUN: 8 mg/dL (ref 6–23)
CHLORIDE: 107 meq/L (ref 96–112)
CO2: 22 mmol/L (ref 19–32)
Calcium: 9 mg/dL (ref 8.4–10.5)
Creatinine, Ser: 0.54 mg/dL (ref 0.50–1.10)
GFR calc Af Amer: >90 mL/min (ref >90)
GLUCOSE: 115 mg/dL — AB (ref 70–99)
Potassium: 3.7 mmol/L (ref 3.5–5.1)
SODIUM: 138 mmol/L (ref 135–145)

## 2014-05-27 MED ORDER — LISINOPRIL 2.5 MG PO TABS: 2.5000 mg | ORAL_TABLET | Freq: Every day | ORAL | Status: DC

## 2014-05-27 NOTE — ED Notes (Signed)
Discharge instructions and prescription given and reviewed with patient.  Patient verbalized understanding to take medication everyday as directed and to follow up with PMD for further management of HTN.  Patient denies pain at this time.  Patient ambulatory; refused wheelchair at discharge; discharged home in good condition.

## 2014-05-27 NOTE — Discharge Instructions (Signed)

## 2014-07-27 ENCOUNTER — Other Ambulatory Visit (HOSPITAL_COMMUNITY): Payer: Self-pay | Admitting: Physician Assistant

## 2014-07-27 DIAGNOSIS — R1011 Right upper quadrant pain: Secondary | ICD-10-CM

## 2014-07-29 ENCOUNTER — Ambulatory Visit (HOSPITAL_COMMUNITY)
Admission: RE | Admit: 2014-07-29 | Discharge: 2014-07-29 | Disposition: A | Discharge status: Home / Self Care | Payer: Medicare Other | Source: Ambulatory Visit | Attending: Physician Assistant | Admitting: Physician Assistant

## 2014-07-29 DIAGNOSIS — R1011 Right upper quadrant pain: Secondary | ICD-10-CM | POA: Diagnosis not present

## 2014-07-31 ENCOUNTER — Encounter (INDEPENDENT_AMBULATORY_CARE_PROVIDER_SITE_OTHER): Payer: Self-pay | Admitting: *Deleted

## 2014-08-06 ENCOUNTER — Ambulatory Visit (INDEPENDENT_AMBULATORY_CARE_PROVIDER_SITE_OTHER): Payer: Medicare Other | Admitting: Internal Medicine

## 2014-08-06 ENCOUNTER — Encounter (INDEPENDENT_AMBULATORY_CARE_PROVIDER_SITE_OTHER): Payer: Self-pay | Admitting: *Deleted

## 2014-08-06 ENCOUNTER — Encounter (INDEPENDENT_AMBULATORY_CARE_PROVIDER_SITE_OTHER): Payer: Self-pay | Admitting: Internal Medicine

## 2014-08-06 ENCOUNTER — Other Ambulatory Visit (INDEPENDENT_AMBULATORY_CARE_PROVIDER_SITE_OTHER): Payer: Self-pay | Admitting: *Deleted

## 2014-08-06 VITALS — BP 142/70 | HR 65 | Temp 97.7°F | Ht 62.0 in | Wt 115.7 lb

## 2014-08-06 DIAGNOSIS — K219 Gastro-esophageal reflux disease without esophagitis: Secondary | ICD-10-CM

## 2014-08-06 MED ORDER — OMEPRAZOLE 40 MG PO CPDR: 40.0000 mg | DELAYED_RELEASE_CAPSULE | Freq: Every day | ORAL | Status: DC

## 2014-08-06 NOTE — Patient Instructions (Signed)
EGD. The risks and benefits such as perforation, bleeding, and infection were reviewed with the patient and is agreeable.The risks and benefits such as perforation, bleeding, and infection were reviewed with the patient and is agreeable.

## 2014-08-06 NOTE — Progress Notes (Signed)
   Subjective:    Patient ID: Stacey Nash, female    DOB: [birth year], 64 y.o.   MRN: [id number]  HPI Patient on 02 [place] x 1 year.  Referred to our office by the [facility] for ruq pain. She has frequent burping. If she eats, she feels bloated. Very seldom does she have acid reflux. She has frequent nausea. Symptoms for months and months. Takes Goody Powders 1/2 tab 4 tabs a week for many years for arthritis. Pain worse after eatinig. Has tried Zantac which has not helped. Appetite is not good. She says she has lost about 10 pounds over the past 4 months.  She has a BM daily. No melena or BRRB. She occasionally gets constipated.  07/29/2014 US abdomen:    IMPRESSION: Normal exam.No gallstones  Review of Systems Past Medical History  Diagnosis Date  . Diverticula of intestine   . COPD (chronic obstructive pulmonary disease)   . Allergy or intolerance to drug     Doxycycline caused worsening shortness of breath and loose stools.  . Chronic neck pain   . Tobacco abuse     Past Surgical History  Procedure Laterality Date  . Eye surgery    . Laparoscopic appendectomy  07/14/2011    Procedure: APPENDECTOMY LAPAROSCOPIC;  Surgeon: Donato Heinz, MD;  Location: [facility];  Service: General;  Laterality: N/A;  . Appendectomy      Allergies  Allergen Reactions  . Sulfa Antibiotics Shortness Of Breath  . Doxycycline Diarrhea  . Zofran [Ondansetron Hcl] Hives and Rash    Rash at injection site that spread immediately following administration      Current Outpatient Prescriptions on File Prior to Visit  Medication Sig Dispense Refill  . albuterol (PROVENTIL HFA;VENTOLIN HFA) 108 (90 BASE) MCG/ACT inhaler Inhale into the lungs every 6 (six) hours as needed for wheezing or shortness of breath.    Marland Kitchen albuterol (PROVENTIL) (2.5 MG/3ML) 0.083% nebulizer solution Take 2.5 mg by nebulization every 6 (six) hours as needed for wheezing or shortness of breath.    .  Aspirin-Acetaminophen-Caffeine (GOODY HEADACHE PO) Take 0.5-1 packets by mouth daily as needed (for pain).    Marland Kitchen tiotropium (SPIRIVA HANDIHALER) 18 MCG inhalation capsule Place 18 mcg into inhaler and inhale every morning.      No current facility-administered medications on file prior to visit.        Objective:   Physical ExamBlood pressure 142/70, pulse 65, temperature 97.7 F (36.5 C), height 5' 2"  (1.575 m), weight 115 lb 11.2 oz (52.481 kg). Alert and oriented. Skin warm and dry. Oral mucosa is moist.   . Sclera anicteric, conjunctivae is pink. Thyroid not enlarged. No cervical lymphadenopathy. Lungs clear. Heart regular rate and rhythm.  Abdomen is soft. Bowel sounds are positive. No hepatomegaly. No abdominal masses felt. No tenderness.  No edema to lower extremities.         Assessment & Plan:  Epigastric pain. PUD needs to be ruled out. IF EGD normal may need HIDA scan EGD.The risks and benefits such as perforation, bleeding, and infection were reviewed with the patient and is agreeable.

## 2014-09-09 ENCOUNTER — Other Ambulatory Visit (INDEPENDENT_AMBULATORY_CARE_PROVIDER_SITE_OTHER): Payer: Self-pay | Admitting: Internal Medicine

## 2014-09-09 ENCOUNTER — Encounter (HOSPITAL_COMMUNITY)
Admission: RE | Disposition: A | Discharge status: Home / Self Care | Payer: Self-pay | Source: Ambulatory Visit | Attending: Internal Medicine

## 2014-09-09 ENCOUNTER — Encounter (HOSPITAL_COMMUNITY): Payer: Self-pay | Admitting: *Deleted

## 2014-09-09 ENCOUNTER — Ambulatory Visit (HOSPITAL_COMMUNITY)
Admission: RE | Admit: 2014-09-09 | Discharge: 2014-09-09 | Disposition: A | Discharge status: Home / Self Care | Payer: Medicare Other | Source: Ambulatory Visit | Attending: Internal Medicine | Admitting: Internal Medicine

## 2014-09-09 DIAGNOSIS — R1011 Right upper quadrant pain: Secondary | ICD-10-CM | POA: Diagnosis present

## 2014-09-09 DIAGNOSIS — Z882 Allergy status to sulfonamides status: Secondary | ICD-10-CM | POA: Diagnosis not present

## 2014-09-09 DIAGNOSIS — K449 Diaphragmatic hernia without obstruction or gangrene: Secondary | ICD-10-CM | POA: Insufficient documentation

## 2014-09-09 DIAGNOSIS — R141 Gas pain: Secondary | ICD-10-CM | POA: Diagnosis not present

## 2014-09-09 DIAGNOSIS — F17210 Nicotine dependence, cigarettes, uncomplicated: Secondary | ICD-10-CM | POA: Insufficient documentation

## 2014-09-09 DIAGNOSIS — Z9049 Acquired absence of other specified parts of digestive tract: Secondary | ICD-10-CM | POA: Diagnosis not present

## 2014-09-09 DIAGNOSIS — R634 Abnormal weight loss: Secondary | ICD-10-CM

## 2014-09-09 DIAGNOSIS — R110 Nausea: Secondary | ICD-10-CM | POA: Diagnosis not present

## 2014-09-09 DIAGNOSIS — Z888 Allergy status to other drugs, medicaments and biological substances status: Secondary | ICD-10-CM | POA: Insufficient documentation

## 2014-09-09 DIAGNOSIS — Z7982 Long term (current) use of aspirin: Secondary | ICD-10-CM | POA: Insufficient documentation

## 2014-09-09 DIAGNOSIS — R142 Eructation: Secondary | ICD-10-CM

## 2014-09-09 DIAGNOSIS — K219 Gastro-esophageal reflux disease without esophagitis: Secondary | ICD-10-CM

## 2014-09-09 DIAGNOSIS — Z881 Allergy status to other antibiotic agents status: Secondary | ICD-10-CM | POA: Insufficient documentation

## 2014-09-09 DIAGNOSIS — J449 Chronic obstructive pulmonary disease, unspecified: Secondary | ICD-10-CM | POA: Insufficient documentation

## 2014-09-09 DIAGNOSIS — R140 Abdominal distension (gaseous): Secondary | ICD-10-CM

## 2014-09-09 DIAGNOSIS — K5730 Diverticulosis of large intestine without perforation or abscess without bleeding: Secondary | ICD-10-CM | POA: Insufficient documentation

## 2014-09-09 DIAGNOSIS — R630 Anorexia: Secondary | ICD-10-CM

## 2014-09-09 HISTORY — PX: ESOPHAGOGASTRODUODENOSCOPY: SHX5428

## 2014-09-09 SURGERY — EGD (ESOPHAGOGASTRODUODENOSCOPY)
Anesthesia: Moderate Sedation

## 2014-09-09 MED ORDER — MIDAZOLAM HCL 5 MG/5ML IJ SOLN
INTRAMUSCULAR | Status: AC
  Filled 2014-09-09: qty 10

## 2014-09-09 MED ORDER — MIDAZOLAM HCL 5 MG/5ML IJ SOLN
INTRAMUSCULAR | Status: DC | PRN
  Administered 2014-09-09 (×2): 1 mg via INTRAVENOUS

## 2014-09-09 MED ORDER — MEPERIDINE HCL 50 MG/ML IJ SOLN
INTRAMUSCULAR | Status: DC | PRN
  Administered 2014-09-09 (×2): 25 mg via INTRAVENOUS

## 2014-09-09 MED ORDER — MEPERIDINE HCL 50 MG/ML IJ SOLN
INTRAMUSCULAR | Status: AC
  Filled 2014-09-09: qty 1

## 2014-09-09 MED ORDER — SODIUM CHLORIDE 0.9 % IV SOLN
INTRAVENOUS | Status: DC
  Administered 2014-09-09: 09:00:00 via INTRAVENOUS

## 2014-09-09 MED ORDER — PROMETHAZINE HCL 12.5 MG PO TABS: 12.5000 mg | ORAL_TABLET | Freq: Two times a day (BID) | ORAL | Status: DC | PRN

## 2014-09-09 MED ORDER — BUTAMBEN-TETRACAINE-BENZOCAINE 2-2-14 % EX AERO
INHALATION_SPRAY | CUTANEOUS | Status: DC | PRN
Start: 2014-09-09 — End: 2014-09-09
  Administered 2014-09-09: 2 via TOPICAL

## 2014-09-09 MED ORDER — STERILE WATER FOR IRRIGATION IR SOLN
Status: DC | PRN
  Administered 2014-09-09: 09:00:00

## 2014-09-09 NOTE — H&P (Addendum)
Stacey Nash is an 64 y.o. female.   Chief Complaint: Patient is here for diagnostic EGD. HPI: Patient is 64 year old Caucasian female was O2 dependent COPD who presents with one-year history of postprandial bloating and frequent burping intermittent discomfort below the right costal margin. She also has had nausea without vomiting decrease her appetite and has lost 9 pounds in the last 3-4 months. She denies melena or rectal bleeding. She gets remote history of peptic ulcer disease. She is not feeling any better with PPI. She had upper abdominal ultrasound last month and was negative for cholelithiasis. She denies heartburn or dysphagia. She takes half of a Goody powder daily.   Past Medical History  Diagnosis Date  . Diverticula of intestine   . COPD (chronic obstructive pulmonary disease)   . Allergy or intolerance to drug     Doxycycline caused worsening shortness of breath and loose stools.  . Chronic neck pain   . Tobacco abuse     Past Surgical History  Procedure Laterality Date  . Eye surgery    . Laparoscopic appendectomy  07/14/2011    Procedure: APPENDECTOMY LAPAROSCOPIC;  Surgeon: Donato Heinz, MD;  Location: [facility];  Service: General;  Laterality: N/A;  . Appendectomy      2013    History reviewed. No pertinent family history. Social History:  reports that she quit smoking about a year ago. Her smoking use included Cigarettes. She does not have any smokeless tobacco history on file. She reports that she does not drink alcohol or use illicit drugs.  Allergies:  Allergies  Allergen Reactions  . Sulfa Antibiotics Shortness Of Breath  . Doxycycline Diarrhea  . Zofran [Ondansetron Hcl] Hives and Rash    Rash at injection site that spread immediately following administration      Medications Prior to Admission  Medication Sig Dispense Refill  . albuterol (PROVENTIL HFA;VENTOLIN HFA) 108 (90 BASE) MCG/ACT inhaler Inhale into the lungs every 6 (six) hours as needed  for wheezing or shortness of breath.    Marland Kitchen albuterol (PROVENTIL) (2.5 MG/3ML) 0.083% nebulizer solution Take 2.5 mg by nebulization every 6 (six) hours as needed for wheezing or shortness of breath.    . Aspirin-Acetaminophen-Caffeine (GOODY HEADACHE PO) Take 0.5-1 packets by mouth daily as needed (for pain).    Marland Kitchen lisinopril (PRINIVIL,ZESTRIL) 5 MG tablet Take 5 mg by mouth daily.    Marland Kitchen omeprazole (PRILOSEC) 40 MG capsule Take 1 capsule (40 mg total) by mouth daily. 30 capsule 3  . tiotropium (SPIRIVA HANDIHALER) 18 MCG inhalation capsule Place 18 mcg into inhaler and inhale every morning.       No results found for this or any previous visit (from the past 48 hour(s)). No results found.  ROS  Blood pressure 174/103, pulse 89, temperature 98 F (36.7 C), resp. rate 19, SpO2 98 %. Physical Exam  Constitutional:  Well-developed thin Caucasian female who has O2 nasal cannula in place  HENT:  Mouth/Throat: Oropharynx is clear and moist.  Eyes: Conjunctivae are normal. No scleral icterus.  Neck: No thyromegaly present.  Cardiovascular: Normal rate, regular rhythm and normal heart sounds.   No murmur heard. Respiratory: Effort normal.  Diminished intensity of breath sounds bilaterally but no rales or rhonchi noted.  GI: Soft. She exhibits no distension and no mass. There is no tenderness.  Musculoskeletal: She exhibits no edema.  Lymphadenopathy:    She has no cervical adenopathy.  Neurological: She is alert.  Skin: Skin is warm and dry.  Assessment/Plan Nausea anorexia weight loss postprandial burping and right upper quadrant abdominal pain and ultrasound negative for cholelithiasis. Diagnostic EGD.  Jackey Housey U 09/09/2014, 9:20 AM

## 2014-09-09 NOTE — Progress Notes (Signed)
Patient and family requesting a prescription for nausea due to patient's lack of appetite and nausea. Dr. Laural Golden notified.  Per family patient does not tolerate Zofran, requesting phenergan.

## 2014-09-09 NOTE — Discharge Instructions (Signed)
Resume usual medications and diet. No driving for 24 hours. Promethazine 12.5 mg by mouth twice a day when necessary. HIDA scan with CCK to be scheduled.  Esophagogastroduodenoscopy Care After Refer to this sheet in the next few weeks. These instructions provide you with information on caring for yourself after your procedure. Your caregiver may also give you more specific instructions. Your treatment has been planned according to current medical practices, but problems sometimes occur. Call your caregiver if you have any problems or questions after your procedure.  HOME CARE INSTRUCTIONS  Do not eat or drink anything until the numbing medicine (local anesthetic) has worn off and your gag reflex has returned. You will know that the local anesthetic has worn off when you can swallow comfortably.  Do not drive for 12 hours after the procedure or as directed by your caregiver.  Only take medicines as directed by your caregiver. SEEK MEDICAL CARE IF:   You cannot stop coughing.  You are not urinating at all or less than usual. SEEK IMMEDIATE MEDICAL CARE IF:  You have difficulty swallowing.  You cannot eat or drink.  You have worsening throat or chest pain.  You have dizziness, lightheadedness, or you faint.  You have nausea or vomiting.  You have chills.  You have a fever.  You have severe abdominal pain.  You have black, tarry, or bloody stools. Document Released: 05/01/2012 Document Reviewed: 05/01/2012 [facility] Patient Information 2015 [place]. This information is not intended to replace advice given to you by your health care provider. Make sure you discuss any questions you have with your health care provider.

## 2014-09-09 NOTE — Op Note (Signed)
EGD PROCEDURE REPORT  PATIENT:  Stacey Nash  MR#:  [id number] Birthdate:  1950-05-30, 64 y.o., female Endoscopist:  Dr. Rogene Houston, MD Referred By:  Dr. Bronson Curb, PA-C  Procedure Date: 09/09/2014  Procedure:   EGD  Indications:  Patient is 64 year old Caucasian female who presents with one-year history of intermittent right upper quadrant pain postprandial bloating and nausea frequent burping anorexia and weight loss. Symptoms of gotten worse over the last few months. Ultrasound negative for cholelithiasis. She has not responded to PPI. She is undergoing diagnostic EGD.            Informed Consent:  The risks, benefits, alternatives & imponderables which include, but are not limited to, bleeding, infection, perforation, drug reaction and potential missed lesion have been reviewed.  The potential for biopsy, lesion removal, esophageal dilation, etc. have also been discussed.  Questions have been answered.  All parties agreeable.  Please see history & physical in medical record for more information.  Medications:  Demerol 50 mg IV Versed 2 mg IV Cetacaine spray topically for oropharyngeal anesthesia  Description of procedure:  The endoscope was introduced through the mouth and advanced to the second portion of the duodenum without difficulty or limitations. The mucosal surfaces were surveyed very carefully during advancement of the scope and upon withdrawal.  Findings:  Esophagus:  Mucosa of the esophagus is normal. GE junction was unremarkable. GEJ:  35 cm Hiatus:  38 cm Stomach:  Stomach was empty and distended very well with insufflation. Folds in the proximal stomach were normal. Examination mucosa gastric body, antrum, pyloric channel, angularis fundus and cardia was normal. Duodenum:  Normal bulbar and post bulbar mucosa.  Therapeutic/Diagnostic Maneuvers Performed:  None  Complications:  None  Impression: Small sliding hiatal hernia otherwise normal  EGD.  Recommendations:  Standard instructions given. Will proceed with HIDA scan with CCK. If this study is negative will do solid-phase gastric emptying study.  [name]  09/09/2014  9:45 AM  CC: Dr. Bronson Curb, PA-C & Dr. Rayne Du ref. provider found

## 2014-09-10 ENCOUNTER — Encounter (HOSPITAL_COMMUNITY): Payer: Self-pay | Admitting: Internal Medicine

## 2014-09-14 ENCOUNTER — Ambulatory Visit (HOSPITAL_COMMUNITY)
Admission: RE | Admit: 2014-09-14 | Discharge: 2014-09-14 | Disposition: A | Discharge status: Home / Self Care | Payer: Medicare Other | Source: Ambulatory Visit | Attending: Internal Medicine | Admitting: Internal Medicine

## 2014-09-14 ENCOUNTER — Encounter (HOSPITAL_COMMUNITY): Payer: Self-pay

## 2014-09-14 DIAGNOSIS — R142 Eructation: Secondary | ICD-10-CM

## 2014-09-14 DIAGNOSIS — R110 Nausea: Secondary | ICD-10-CM | POA: Diagnosis not present

## 2014-09-14 DIAGNOSIS — R1011 Right upper quadrant pain: Secondary | ICD-10-CM | POA: Diagnosis not present

## 2014-09-14 DIAGNOSIS — R140 Abdominal distension (gaseous): Secondary | ICD-10-CM

## 2014-09-14 DIAGNOSIS — R634 Abnormal weight loss: Secondary | ICD-10-CM

## 2014-09-14 MED ORDER — STERILE WATER FOR INJECTION IJ SOLN
INTRAMUSCULAR | Status: AC
  Administered 2014-09-14: 1.05 mL
  Filled 2014-09-14: qty 10

## 2014-09-14 MED ORDER — TECHNETIUM TC 99M MEBROFENIN IV KIT
5.0000 | PACK | Freq: Once | INTRAVENOUS | Status: AC | PRN
Start: 2014-09-14 — End: 2014-09-14
  Administered 2014-09-14: 5 via INTRAVENOUS

## 2014-09-14 MED ORDER — SINCALIDE 5 MCG IJ SOLR
INTRAMUSCULAR | Status: AC
  Administered 2014-09-14: 1.05 ug
  Filled 2014-09-14: qty 5

## 2014-09-14 MED ORDER — SODIUM CHLORIDE 0.9 % IJ SOLN
INTRAMUSCULAR | Status: AC
  Filled 2014-09-14: qty 30

## 2014-09-22 ENCOUNTER — Other Ambulatory Visit (INDEPENDENT_AMBULATORY_CARE_PROVIDER_SITE_OTHER): Payer: Self-pay | Admitting: Internal Medicine

## 2014-09-22 DIAGNOSIS — R110 Nausea: Secondary | ICD-10-CM

## 2014-09-22 DIAGNOSIS — R1011 Right upper quadrant pain: Secondary | ICD-10-CM

## 2014-09-28 ENCOUNTER — Ambulatory Visit (HOSPITAL_COMMUNITY)
Admission: RE | Admit: 2014-09-28 | Discharge: 2014-09-28 | Disposition: A | Discharge status: Home / Self Care | Payer: Medicare Other | Source: Ambulatory Visit | Attending: Internal Medicine | Admitting: Internal Medicine

## 2014-09-28 ENCOUNTER — Encounter (HOSPITAL_COMMUNITY): Payer: Self-pay

## 2014-09-28 DIAGNOSIS — R110 Nausea: Secondary | ICD-10-CM | POA: Diagnosis not present

## 2014-09-28 DIAGNOSIS — K219 Gastro-esophageal reflux disease without esophagitis: Secondary | ICD-10-CM | POA: Diagnosis not present

## 2014-09-28 DIAGNOSIS — R1011 Right upper quadrant pain: Secondary | ICD-10-CM | POA: Insufficient documentation

## 2014-09-28 DIAGNOSIS — R1013 Epigastric pain: Secondary | ICD-10-CM | POA: Diagnosis not present

## 2014-09-28 MED ORDER — TECHNETIUM TC 99M SULFUR COLLOID
5.0000 | Freq: Once | INTRAVENOUS | Status: AC | PRN
  Administered 2014-09-28: 5 via ORAL

## 2015-02-15 ENCOUNTER — Encounter (HOSPITAL_COMMUNITY): Payer: Self-pay

## 2015-02-15 ENCOUNTER — Emergency Department (HOSPITAL_COMMUNITY)
Admission: EM | Admit: 2015-02-15 | Discharge: 2015-02-15 | Disposition: A | Discharge status: Home / Self Care | Payer: Medicare Other | Attending: Emergency Medicine | Admitting: Emergency Medicine

## 2015-02-15 ENCOUNTER — Emergency Department (HOSPITAL_COMMUNITY): Payer: Medicare Other

## 2015-02-15 DIAGNOSIS — G8929 Other chronic pain: Secondary | ICD-10-CM | POA: Diagnosis not present

## 2015-02-15 DIAGNOSIS — Z87891 Personal history of nicotine dependence: Secondary | ICD-10-CM | POA: Insufficient documentation

## 2015-02-15 DIAGNOSIS — J449 Chronic obstructive pulmonary disease, unspecified: Secondary | ICD-10-CM | POA: Diagnosis not present

## 2015-02-15 DIAGNOSIS — K529 Noninfective gastroenteritis and colitis, unspecified: Secondary | ICD-10-CM | POA: Insufficient documentation

## 2015-02-15 DIAGNOSIS — K921 Melena: Secondary | ICD-10-CM | POA: Diagnosis not present

## 2015-02-15 DIAGNOSIS — R197 Diarrhea, unspecified: Secondary | ICD-10-CM | POA: Diagnosis present

## 2015-02-15 DIAGNOSIS — Z79899 Other long term (current) drug therapy: Secondary | ICD-10-CM | POA: Insufficient documentation

## 2015-02-15 LAB — URINALYSIS, ROUTINE W REFLEX MICROSCOPIC
Bilirubin Urine: NEGATIVE
GLUCOSE, UA: NEGATIVE mg/dL
Ketones, ur: NEGATIVE mg/dL
LEUKOCYTES UA: NEGATIVE
Nitrite: NEGATIVE
PH: 6 (ref 5.0–8.0)
PROTEIN: NEGATIVE mg/dL
Specific Gravity, Urine: <1.005 — ABNORMAL LOW (ref 1.005–1.030)
Urobilinogen, UA: 0.2 mg/dL (ref 0.0–1.0)

## 2015-02-15 LAB — CBC WITH DIFFERENTIAL/PLATELET
BASOS ABS: 0 10*3/uL (ref 0.0–0.1)
Basophils Relative: 0 %
EOS PCT: 1 %
Eosinophils Absolute: 0.1 10*3/uL (ref 0.0–0.7)
HCT: 41.9 % (ref 36.0–46.0)
Hemoglobin: 14.3 g/dL (ref 12.0–15.0)
Lymphocytes Relative: 17 %
Lymphs Abs: 1.7 10*3/uL (ref 0.7–4.0)
MCH: 28.9 pg (ref 26.0–34.0)
MCHC: 34.1 g/dL (ref 30.0–36.0)
MCV: 84.8 fL (ref 78.0–100.0)
MONO ABS: 0.8 10*3/uL (ref 0.1–1.0)
Monocytes Relative: 8 %
Neutro Abs: 7 10*3/uL (ref 1.7–7.7)
Neutrophils Relative %: 74 %
PLATELETS: 372 10*3/uL (ref 150–400)
RBC: 4.94 MIL/uL (ref 3.87–5.11)
RDW: 12.7 % (ref 11.5–15.5)
WBC: 9.5 10*3/uL (ref 4.0–10.5)

## 2015-02-15 LAB — COMPREHENSIVE METABOLIC PANEL
ALBUMIN: 4.9 g/dL (ref 3.5–5.0)
ALT: 10 U/L — ABNORMAL LOW (ref 14–54)
AST: 20 U/L (ref 15–41)
Alkaline Phosphatase: 78 U/L (ref 38–126)
Anion gap: 9 (ref 5–15)
BUN: 5 mg/dL — AB (ref 6–20)
CHLORIDE: 104 mmol/L (ref 101–111)
CO2: 24 mmol/L (ref 22–32)
Calcium: 9 mg/dL (ref 8.9–10.3)
Creatinine, Ser: 0.53 mg/dL (ref 0.44–1.00)
GFR calc Af Amer: >60 mL/min (ref >60)
GFR calc non Af Amer: >60 mL/min (ref >60)
GLUCOSE: 111 mg/dL — AB (ref 65–99)
POTASSIUM: 3.8 mmol/L (ref 3.5–5.1)
Sodium: 137 mmol/L (ref 135–145)
Total Bilirubin: 1 mg/dL (ref 0.3–1.2)
Total Protein: 7.6 g/dL (ref 6.5–8.1)

## 2015-02-15 LAB — URINE MICROSCOPIC-ADD ON

## 2015-02-15 MED ORDER — METRONIDAZOLE 500 MG PO TABS
500.0000 mg | ORAL_TABLET | Freq: Once | ORAL | Status: AC
  Administered 2015-02-15: 500 mg via ORAL
  Filled 2015-02-15: qty 1

## 2015-02-15 MED ORDER — MORPHINE SULFATE (PF) 4 MG/ML IV SOLN
4.0000 mg | INTRAVENOUS | Status: DC | PRN
  Administered 2015-02-15 (×2): 4 mg via INTRAVENOUS
  Filled 2015-02-15 (×2): qty 1

## 2015-02-15 MED ORDER — METRONIDAZOLE 500 MG PO TABS: 500.0000 mg | ORAL_TABLET | Freq: Two times a day (BID) | ORAL | Status: DC

## 2015-02-15 MED ORDER — SODIUM CHLORIDE 0.9 % IV BOLUS (SEPSIS)
500.0000 mL | Freq: Once | INTRAVENOUS | Status: AC
  Administered 2015-02-15: 20:00:00 via INTRAVENOUS

## 2015-02-15 MED ORDER — HYDROCODONE-ACETAMINOPHEN 5-325 MG PO TABS
1.0000 | ORAL_TABLET | Freq: Once | ORAL | Status: AC
Start: 2015-02-15 — End: 2015-02-15
  Administered 2015-02-15: 1 via ORAL
  Filled 2015-02-15: qty 1

## 2015-02-15 MED ORDER — PROMETHAZINE HCL 25 MG/ML IJ SOLN
12.5000 mg | Freq: Once | INTRAMUSCULAR | Status: AC
  Administered 2015-02-15: 6.25 mg via INTRAVENOUS
  Filled 2015-02-15: qty 1

## 2015-02-15 MED ORDER — PROMETHAZINE HCL 25 MG PO TABS: 25.0000 mg | ORAL_TABLET | Freq: Four times a day (QID) | ORAL | Status: DC | PRN

## 2015-02-15 MED ORDER — IOHEXOL 300 MG/ML  SOLN
50.0000 mL | Freq: Once | INTRAMUSCULAR | Status: AC | PRN
  Administered 2015-02-15: 50 mL via ORAL

## 2015-02-15 MED ORDER — PROMETHAZINE HCL 25 MG/ML IJ SOLN
INTRAMUSCULAR | Status: AC
  Administered 2015-02-15: 6.25 mg via INTRAVENOUS
  Filled 2015-02-15: qty 1

## 2015-02-15 MED ORDER — CIPROFLOXACIN HCL 500 MG PO TABS: 500.0000 mg | ORAL_TABLET | Freq: Two times a day (BID) | ORAL | Status: DC

## 2015-02-15 MED ORDER — IOHEXOL 300 MG/ML  SOLN
100.0000 mL | Freq: Once | INTRAMUSCULAR | Status: AC | PRN
  Administered 2015-02-15: 100 mL via INTRAVENOUS

## 2015-02-15 MED ORDER — PROMETHAZINE HCL 25 MG/ML IJ SOLN
6.2500 mg | Freq: Once | INTRAMUSCULAR | Status: AC
  Administered 2015-02-15: 6.25 mg via INTRAVENOUS

## 2015-02-15 MED ORDER — HYDROCODONE-ACETAMINOPHEN 5-325 MG PO TABS: 1.0000 | ORAL_TABLET | ORAL | Status: DC | PRN

## 2015-02-15 MED ORDER — SODIUM CHLORIDE 0.9 % IV SOLN: Freq: Once | INTRAVENOUS | Status: DC

## 2015-02-15 MED ORDER — CIPROFLOXACIN HCL 250 MG PO TABS
500.0000 mg | ORAL_TABLET | Freq: Once | ORAL | Status: AC
  Administered 2015-02-15: 500 mg via ORAL
  Filled 2015-02-15: qty 2

## 2015-02-15 NOTE — ED Notes (Signed)
Pt assisted to restroom, tolerated well,

## 2015-02-15 NOTE — ED Notes (Signed)
Dr James at bedside,  

## 2015-02-15 NOTE — ED Notes (Signed)
Pt c/o abd pain and diarrhea today.  Reports started having bloody diarrhea this evening.

## 2015-02-15 NOTE — ED Notes (Signed)
Per Dr Jeneen Rinks, the vicodin tablet can be sent home with pt, RN scanned tablet and gave the one tablet of vicodin to the pt to take home,

## 2015-02-15 NOTE — ED Notes (Signed)
Pt and family updated on plan of care,  

## 2015-02-15 NOTE — ED Notes (Signed)
Pt c/o blood diarrhea, abd cramping that started this am,

## 2015-02-15 NOTE — Discharge Instructions (Signed)
Colitis Colitis is inflammation of the colon. Colitis can be a short-term or long-standing (chronic) illness. Crohn's disease and ulcerative colitis are 2 types of colitis which are chronic. They usually require lifelong treatment. CAUSES  There are many different causes of colitis, including:  Viruses.  Germs (bacteria).  Medicine reactions. SYMPTOMS   Diarrhea.  Intestinal bleeding.  Pain.  Fever.  Throwing up (vomiting).  Tiredness (fatigue).  Weight loss.  Bowel blockage. DIAGNOSIS  The diagnosis of colitis is based on examination and stool or blood tests. X-rays, CT scan, and colonoscopy may also be needed. TREATMENT  Treatment may include:  Fluids given through the vein (intravenously).  Bowel rest (nothing to eat or drink for a period of time).  Medicine for pain and diarrhea.  Medicines (antibiotics) that kill germs.  Cortisone medicines.  Surgery. HOME CARE INSTRUCTIONS   Get plenty of rest.  Drink enough water and fluids to keep your urine clear or pale yellow.  Eat a well-balanced diet.  Call your caregiver for follow-up as recommended. SEEK IMMEDIATE MEDICAL CARE IF:   You develop chills.  You have an oral temperature above 102 F (38.9 C), not controlled by medicine.  You have extreme weakness, fainting, or dehydration.  You have repeated vomiting.  You develop severe belly (abdominal) pain or are passing bloody or tarry stools. MAKE SURE YOU:   Understand these instructions.  Will watch your condition.  Will get help right away if you are not doing well or get worse. Document Released: 06/22/2004 Document Revised: 08/07/2011 Document Reviewed: 09/17/2009 [facility] Patient Information 2015 [place], [place]. This information is not intended to replace advice given to you by your health care provider. Make sure you discuss any questions you have with your health care provider.

## 2015-02-16 NOTE — ED Provider Notes (Signed)
CSN: [id number]     Arrival date & time 02/15/15  1837 History   First MD Initiated Contact with Patient 02/15/15 1918     Chief Complaint  Patient presents with  . Abdominal Pain  . Diarrhea      HPI  She presents for evaluation of abdominal pain diarrhea and blood in her stools very awakened this morning with suprapubic abdominal pain cramping and intermittent develops and diarrhea short time later and over the last 2 hours bloody stools with mucus. History of diverticulitis states is been many years since she's had an episode. This is more crampy and she remembers a diverticulitis which was a constant pain.  Past Medical History  Diagnosis Date  . Diverticula of intestine   . COPD (chronic obstructive pulmonary disease)   . Allergy or intolerance to drug     Doxycycline caused worsening shortness of breath and loose stools.  . Chronic neck pain   . Tobacco abuse    Past Surgical History  Procedure Laterality Date  . Eye surgery    . Laparoscopic appendectomy  07/14/2011    Procedure: APPENDECTOMY LAPAROSCOPIC;  Surgeon: Donato Heinz, MD;  Location: [facility];  Service: General;  Laterality: N/A;  . Appendectomy      2013  . Esophagogastroduodenoscopy N/A 09/09/2014    Procedure: ESOPHAGOGASTRODUODENOSCOPY (EGD);  Surgeon: Rogene Houston, MD;  Location: [facility];  Service: Endoscopy;  Laterality: N/A;  210 - moved to 9:10 - Ann to notify pt  . Cholecystectomy     No family history on file. Social History  Substance Use Topics  . Smoking status: Former Smoker    Types: Cigarettes    Quit date: 09/29/2013  . Smokeless tobacco: None     Comment: smoked since age 48  . Alcohol Use: No   OB History    No data available     Review of Systems  Constitutional: Negative for fever, chills, diaphoresis, appetite change and fatigue.  HENT: Negative for mouth sores, sore throat and trouble swallowing.   Eyes: Negative for visual disturbance.  Respiratory: Negative for  cough, chest tightness, shortness of breath and wheezing.   Cardiovascular: Negative for chest pain.  Gastrointestinal: Positive for abdominal pain, diarrhea and blood in stool. Negative for nausea, vomiting and abdominal distention.  Endocrine: Negative for polydipsia, polyphagia and polyuria.  Genitourinary: Negative for dysuria, frequency and hematuria.  Musculoskeletal: Negative for gait problem.  Skin: Negative for color change, pallor and rash.  Neurological: Negative for dizziness, syncope, light-headedness and headaches.  Hematological: Does not bruise/bleed easily.  Psychiatric/Behavioral: Negative for behavioral problems and confusion.      Allergies  Sulfa antibiotics; Doxycycline; and Zofran  Home Medications   Prior to Admission medications   Medication Sig Start Date End Date Taking? Authorizing Provider  albuterol (PROVENTIL HFA;VENTOLIN HFA) 108 (90 BASE) MCG/ACT inhaler Inhale into the lungs every 6 (six) hours as needed for wheezing or shortness of breath.   Yes Historical Provider, MD  albuterol (PROVENTIL) (2.5 MG/3ML) 0.083% nebulizer solution Take 2.5 mg by nebulization every 6 (six) hours as needed for wheezing or shortness of breath.   Yes Historical Provider, MD  Aspirin-Acetaminophen-Caffeine (GOODY HEADACHE PO) Take 0.5-1 packets by mouth daily as needed (for pain).   Yes Historical Provider, MD  feeding supplement (BOOST HIGH PROTEIN) LIQD Take 1 Container by mouth daily as needed (for supplement).   Yes Historical Provider, MD  hydrALAZINE (APRESOLINE) 10 MG tablet TAKE 1 TABLET BY MOUTH  EVERY DAY IF BP IS OVER 180 02/08/15  Yes Historical Provider, MD  lisinopril (PRINIVIL,ZESTRIL) 10 MG tablet Take 10 mg by mouth daily.   Yes Historical Provider, MD  NON FORMULARY Apply 1 application topically daily as needed ([place] [facility]).   Yes Historical Provider, MD  tiotropium (SPIRIVA HANDIHALER) 18 MCG inhalation capsule Place 18 mcg into inhaler  and inhale every morning.    Yes Historical Provider, MD  ciprofloxacin (CIPRO) 500 MG tablet Take 1 tablet (500 mg total) by mouth every 12 (twelve) hours. 02/15/15   Tanna Furry, MD  HYDROcodone-acetaminophen (NORCO/VICODIN) 5-325 MG per tablet Take 1 tablet by mouth every 4 (four) hours as needed. 02/15/15   Tanna Furry, MD  metroNIDAZOLE (FLAGYL) 500 MG tablet Take 1 tablet (500 mg total) by mouth 2 (two) times daily. 02/15/15   Tanna Furry, MD  promethazine (PHENERGAN) 25 MG tablet Take 1 tablet (25 mg total) by mouth every 6 (six) hours as needed for nausea or vomiting. 02/15/15   Tanna Furry, MD   BP 136/72 mmHg  Pulse 92  Temp(Src) 98.1 F (36.7 C) (Oral)  Resp 16  Ht 5' 2"  (1.575 m)  Wt 110 lb (49.896 kg)  BMI 20.11 kg/m2  SpO2 98% Physical Exam  Constitutional: She is oriented to person, place, and time. She appears well-developed and well-nourished. No distress.  HENT:  Head: Normocephalic.  Eyes: Conjunctivae are normal. Pupils are equal, round, and reactive to light. No scleral icterus.  Neck: Normal range of motion. Neck supple. No thyromegaly present.  Cardiovascular: Normal rate and regular rhythm.  Exam reveals no gallop and no friction rub.   No murmur heard. Pulmonary/Chest: Effort normal and breath sounds normal. No respiratory distress. She has no wheezes. She has no rales.  Abdominal: Soft. Bowel sounds are normal. She exhibits no distension. There is no tenderness. There is no rebound.    Suprapubic tenderness without guarding rebound or peritoneal irritation.  Musculoskeletal: Normal range of motion.  Neurological: She is alert and oriented to person, place, and time.  Skin: Skin is warm and dry. No rash noted.  Psychiatric: She has a normal mood and affect. Her behavior is normal.    ED Course  Procedures (including critical care time) Labs Review Labs Reviewed  COMPREHENSIVE METABOLIC PANEL - Abnormal; Notable for the following:    Glucose, Bld 111 (*)    BUN  5 (*)    ALT 10 (*)    All other components within normal limits  URINALYSIS, ROUTINE W REFLEX MICROSCOPIC (NOT AT [facility]) - Abnormal; Notable for the following:    Specific Gravity, Urine <1.005 (*)    Hgb urine dipstick TRACE (*)    All other components within normal limits  URINE MICROSCOPIC-ADD ON - Abnormal; Notable for the following:    Squamous Epithelial / LPF FEW (*)    All other components within normal limits  CBC WITH DIFFERENTIAL/PLATELET    Imaging Review Ct Abdomen Pelvis W Contrast  02/15/2015   CLINICAL DATA:  64 year old female with acute abdominal pain and diarrhea today.  EXAM: CT ABDOMEN AND PELVIS WITH CONTRAST  TECHNIQUE: Multidetector CT imaging of the abdomen and pelvis was performed using the standard protocol following bolus administration of intravenous contrast.  CONTRAST:  162m OMNIPAQUE IOHEXOL 300 MG/ML  COMPARISON:  07/13/2011 CT  FINDINGS: Lower chest:  No acute abnormality  Hepatobiliary: The liver is unremarkable. The patient is status post cholecystectomy. There is no evidence of biliary dilatation.  Pancreas: Unremarkable  Spleen: Unremarkable  Adrenals/Urinary Tract: A 3.4 x 3.6 cm left adrenal mass containing calcifications is noted (image 19), previously measuring 2.8 x 2.9 cm. There is equivocal internal fat present. A stable 1 cm right adrenal nodule is noted, likely an adenoma. The kidneys and bladder are unremarkable.  Stomach/Bowel: There is probable mild wall thickening of the descending and sigmoid colon compatible with a mild colitis. There is no evidence of bowel obstruction, pneumoperitoneum or abscess. The visualized mesenteric arteries and veins are patent.  Vascular/Lymphatic: No enlarged lymph nodes or abdominal aortic aneurysm.  Reproductive: Uterus and adnexal regions are unremarkable except for evidence of tubal ligation.  Other: No free fluid.  Musculoskeletal: No acute or suspicious abnormalities.  IMPRESSION: Probable mild wall thickening of  the descending and sigmoid colon compatible with mild colitis, likely infectious or inflammatory. No evidence of bowel obstruction, pneumoperitoneum or abscess. Mesenteric vasculature appears patent.  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 Consider MR evaluation for confirmation.  Unchanged 1 cm right adrenal nodule compatible with an adenoma.   Electronically Signed   By: Margarette Canada M.D.   On: 02/15/2015 22:31   I have personally reviewed and evaluated these images and lab results as part of my medical decision-making.   EKG Interpretation None      MDM   Final diagnoses:  Colitis    CT, history CONSISTENT with acute colitis. Given Cipro and Flagyl. Taking by mouth liquids. Plan is home, Vicodin, Cipro, Flagyl, primary care follow-up. ER with any failure to improve or worsening symptoms.    Tanna Furry, MD 02/16/15 0005

## 2015-03-29 ENCOUNTER — Other Ambulatory Visit (HOSPITAL_COMMUNITY): Payer: Self-pay | Admitting: Physician Assistant

## 2015-03-29 DIAGNOSIS — R922 Inconclusive mammogram: Secondary | ICD-10-CM

## 2015-03-29 DIAGNOSIS — R229 Localized swelling, mass and lump, unspecified: Principal | ICD-10-CM

## 2015-03-29 DIAGNOSIS — IMO0002 Reserved for concepts with insufficient information to code with codable children: Secondary | ICD-10-CM

## 2015-04-13 ENCOUNTER — Ambulatory Visit (HOSPITAL_COMMUNITY)
Admission: RE | Admit: 2015-04-13 | Discharge: 2015-04-13 | Disposition: A | Discharge status: Home / Self Care | Payer: Medicare Other | Source: Ambulatory Visit | Attending: Physician Assistant | Admitting: Physician Assistant

## 2015-04-13 ENCOUNTER — Other Ambulatory Visit (HOSPITAL_COMMUNITY): Payer: Self-pay | Admitting: Physician Assistant

## 2015-04-13 ENCOUNTER — Encounter (HOSPITAL_COMMUNITY): Payer: Medicare Other

## 2015-04-13 DIAGNOSIS — R928 Other abnormal and inconclusive findings on diagnostic imaging of breast: Secondary | ICD-10-CM | POA: Insufficient documentation

## 2015-04-13 DIAGNOSIS — R922 Inconclusive mammogram: Secondary | ICD-10-CM

## 2015-06-24 ENCOUNTER — Emergency Department (HOSPITAL_COMMUNITY)
Admission: EM | Admit: 2015-06-24 | Discharge: 2015-06-24 | Disposition: A | Discharge status: Home / Self Care | Payer: Medicare Other | Attending: Emergency Medicine | Admitting: Emergency Medicine

## 2015-06-24 ENCOUNTER — Encounter (HOSPITAL_COMMUNITY): Payer: Self-pay

## 2015-06-24 ENCOUNTER — Emergency Department (HOSPITAL_COMMUNITY): Payer: Medicare Other

## 2015-06-24 DIAGNOSIS — S2231XA Fracture of one rib, right side, initial encounter for closed fracture: Secondary | ICD-10-CM | POA: Diagnosis not present

## 2015-06-24 DIAGNOSIS — Z79899 Other long term (current) drug therapy: Secondary | ICD-10-CM | POA: Insufficient documentation

## 2015-06-24 DIAGNOSIS — Z792 Long term (current) use of antibiotics: Secondary | ICD-10-CM | POA: Diagnosis not present

## 2015-06-24 DIAGNOSIS — Z8719 Personal history of other diseases of the digestive system: Secondary | ICD-10-CM | POA: Insufficient documentation

## 2015-06-24 DIAGNOSIS — J449 Chronic obstructive pulmonary disease, unspecified: Secondary | ICD-10-CM | POA: Diagnosis not present

## 2015-06-24 DIAGNOSIS — R197 Diarrhea, unspecified: Secondary | ICD-10-CM | POA: Diagnosis not present

## 2015-06-24 DIAGNOSIS — Y9289 Other specified places as the place of occurrence of the external cause: Secondary | ICD-10-CM | POA: Insufficient documentation

## 2015-06-24 DIAGNOSIS — R05 Cough: Secondary | ICD-10-CM | POA: Diagnosis not present

## 2015-06-24 DIAGNOSIS — X58XXXA Exposure to other specified factors, initial encounter: Secondary | ICD-10-CM | POA: Diagnosis not present

## 2015-06-24 DIAGNOSIS — G8929 Other chronic pain: Secondary | ICD-10-CM | POA: Diagnosis not present

## 2015-06-24 DIAGNOSIS — Y998 Other external cause status: Secondary | ICD-10-CM | POA: Diagnosis not present

## 2015-06-24 DIAGNOSIS — R0602 Shortness of breath: Secondary | ICD-10-CM | POA: Diagnosis not present

## 2015-06-24 DIAGNOSIS — R110 Nausea: Secondary | ICD-10-CM | POA: Diagnosis not present

## 2015-06-24 DIAGNOSIS — Y9389 Activity, other specified: Secondary | ICD-10-CM | POA: Diagnosis not present

## 2015-06-24 DIAGNOSIS — Z87891 Personal history of nicotine dependence: Secondary | ICD-10-CM | POA: Diagnosis not present

## 2015-06-24 DIAGNOSIS — S29001A Unspecified injury of muscle and tendon of front wall of thorax, initial encounter: Secondary | ICD-10-CM | POA: Diagnosis present

## 2015-06-24 MED ORDER — HYDROCODONE-ACETAMINOPHEN 5-325 MG PO TABS: 1.0000 | ORAL_TABLET | Freq: Four times a day (QID) | ORAL | Status: DC | PRN

## 2015-06-24 MED ORDER — KETOROLAC TROMETHAMINE 30 MG/ML IJ SOLN
15.0000 mg | Freq: Once | INTRAMUSCULAR | Status: AC
  Administered 2015-06-24: 15 mg via INTRAVENOUS
  Filled 2015-06-24: qty 1

## 2015-06-24 MED ORDER — NAPROXEN 250 MG PO TABS: ORAL_TABLET | ORAL | Status: DC

## 2015-06-24 NOTE — ED Notes (Signed)
Patient was discharge home, didn't have portable O2 with her, son came to ED to take home didn't bring 02, patient stated it was too far for him to go home to retrieve, she would go home without it, patient transported to vehicle via wheelchair, no distress noted.

## 2015-06-24 NOTE — ED Notes (Signed)
Patient up to bedside commode tolerated well

## 2015-06-24 NOTE — ED Notes (Signed)
Right upper abd and right flank pain x 2-3 days, nausea and diarrhea.   Pt states pain is worse with movement and palpation.

## 2015-06-24 NOTE — ED Provider Notes (Signed)
CSN: [id number]     Arrival date & time 06/24/15  0020 History  By signing my name below, I, Nicole Kindred, attest that this documentation has been prepared under the direction and in the presence of Rolland Porter, MD at [phone].   Electronically Signed: Nicole Kindred, ED Scribe. 06/24/2015. 1:07 AM  Chief Complaint  Patient presents with  . Flank Pain   The history is provided by the patient. No language interpreter was used.   HPI Comments: Stacey Nash is a 65 y.o. female with hx COPD on 2L O2 at home who presents to the Emergency Department complaining of gradual onset, constant, sometimes burning,soreness in right rib pain, onset a few days ago. No known injury, trauma, or fall. She reports associated chronic productive cough with clear phlegm, chronic shortness of breath, nausea, and mild diarrhea (3-4 episodes of watery today). Pain is worsened with movement, when her ribs are touched, and with taking a deep breath. Pt states she has had costochondritis in the past and that pain feels similar. Pt denies rash, fever, chills, vomiting, wheezing, abdominal pain.Marland Kitchen PSHx cholecystectomy. No hx shingles. Patient had colitis in September and states this pain is totally different.  PCP Dr Alford Highland   Past Medical History  Diagnosis Date  . Diverticula of intestine   . COPD (chronic obstructive pulmonary disease) ([place])   . Allergy or intolerance to drug     Doxycycline caused worsening shortness of breath and loose stools.  . Chronic neck pain   . Tobacco abuse    Past Surgical History  Procedure Laterality Date  . Eye surgery    . Laparoscopic appendectomy  07/14/2011    Procedure: APPENDECTOMY LAPAROSCOPIC;  Surgeon: Donato Heinz, MD;  Location: [facility];  Service: General;  Laterality: N/A;  . Appendectomy      2013  . Esophagogastroduodenoscopy N/A 09/09/2014    Procedure: ESOPHAGOGASTRODUODENOSCOPY (EGD);  Surgeon: Rogene Houston, MD;  Location: [facility];  Service:  Endoscopy;  Laterality: N/A;  210 - moved to 9:10 - Ann to notify pt  . Cholecystectomy     No family history on file. Social History  Substance Use Topics  . Smoking status: Former Smoker    Types: Cigarettes    Quit date: 09/29/2013  . Smokeless tobacco: None     Comment: smoked since age 55  . Alcohol Use: No    Pt is currently a nonsmoker who quit 2.5 years ago and lives with her disabled son  OB History    No data available     Review of Systems  Constitutional: Negative for fever and chills.  Respiratory: Positive for cough and shortness of breath. Negative for wheezing.   Gastrointestinal: Positive for nausea and diarrhea. Negative for vomiting and abdominal pain.  Skin: Negative for rash.  All other systems reviewed and are negative.   Allergies  Sulfa antibiotics; Doxycycline; and Zofran  Home Medications   Prior to Admission medications   Medication Sig Start Date End Date Taking? Authorizing Provider  albuterol (PROVENTIL HFA;VENTOLIN HFA) 108 (90 BASE) MCG/ACT inhaler Inhale into the lungs every 6 (six) hours as needed for wheezing or shortness of breath.    Historical Provider, MD  albuterol (PROVENTIL) (2.5 MG/3ML) 0.083% nebulizer solution Take 2.5 mg by nebulization every 6 (six) hours as needed for wheezing or shortness of breath.    Historical Provider, MD  Aspirin-Acetaminophen-Caffeine (GOODY HEADACHE PO) Take 0.5-1 packets by mouth daily as needed (for pain).  Historical Provider, MD  ciprofloxacin (CIPRO) 500 MG tablet Take 1 tablet (500 mg total) by mouth every 12 (twelve) hours. 02/15/15   Tanna Furry, MD  feeding supplement (BOOST HIGH PROTEIN) LIQD Take 1 Container by mouth daily as needed (for supplement).    Historical Provider, MD  hydrALAZINE (APRESOLINE) 10 MG tablet TAKE 1 TABLET BY MOUTH EVERY DAY IF BP IS OVER 180 02/08/15   Historical Provider, MD  HYDROcodone-acetaminophen (NORCO/VICODIN) 5-325 MG tablet Take 1 tablet by mouth every 6 (six)  hours as needed for moderate pain or severe pain. 06/24/15   Rolland Porter, MD  lisinopril (PRINIVIL,ZESTRIL) 10 MG tablet Take 10 mg by mouth daily.    Historical Provider, MD  metroNIDAZOLE (FLAGYL) 500 MG tablet Take 1 tablet (500 mg total) by mouth 2 (two) times daily. 02/15/15   Tanna Furry, MD  naproxen (NAPROSYN) 250 MG tablet Take 1 po BID with food prn pain 06/24/15   Rolland Porter, MD  NON FORMULARY Apply 1 application topically daily as needed [place] [facility]).    Historical Provider, MD  promethazine (PHENERGAN) 25 MG tablet Take 1 tablet (25 mg total) by mouth every 6 (six) hours as needed for nausea or vomiting. 02/15/15   Tanna Furry, MD  tiotropium (SPIRIVA HANDIHALER) 18 MCG inhalation capsule Place 18 mcg into inhaler and inhale every morning.     Historical Provider, MD   BP 162/95 mmHg  Pulse 78  Temp(Src) 97.8 F (36.6 C) (Oral)  Resp 20  Ht 5' 2"  (1.575 m)  Wt 104 lb (47.174 kg)  BMI 19.02 kg/m2  SpO2 98%  Vital signs normal   Physical Exam  Constitutional: She is oriented to person, place, and time.  Non-toxic appearance. She does not appear ill. No distress.  Frail female appears older than stated age.   HENT:  Head: Normocephalic and atraumatic.  Right Ear: External ear normal.  Left Ear: External ear normal.  Nose: Nose normal. No mucosal edema or rhinorrhea.  Mouth/Throat: Oropharynx is clear and moist and mucous membranes are normal. No dental abscesses or uvula swelling.  Eyes: Conjunctivae and EOM are normal. Pupils are equal, round, and reactive to light.  Neck: Normal range of motion and full passive range of motion without pain. Neck supple.  Cardiovascular: Normal rate, regular rhythm and normal heart sounds.  Exam reveals no gallop and no friction rub.   No murmur heard. Pulmonary/Chest: Effort normal and breath sounds normal. No respiratory distress. She has no wheezes. She has no rhonchi. She has no rales. She exhibits tenderness. She exhibits  no crepitus.    TTP over the right lateral lower chest wall without crepitus.    Abdominal: Soft. Normal appearance and bowel sounds are normal. She exhibits no distension. There is no tenderness. There is no rebound and no guarding.  Musculoskeletal: Normal range of motion. She exhibits no edema or tenderness.  Moves all extremities well.   Neurological: She is alert and oriented to person, place, and time. She has normal strength. No cranial nerve deficit.  Skin: Skin is warm, dry and intact. No rash noted. No erythema. No pallor.  No rash, specifically lesions consistent with shingles.   Psychiatric: She has a normal mood and affect. Her speech is normal and behavior is normal. Her mood appears not anxious.  Nursing note and vitals reviewed.   ED Course  Procedures   Medications  ketorolac (TORADOL) 30 MG/ML injection 15 mg (15 mg Intravenous Given 06/24/15 0129)  DIAGNOSTIC STUDIES: Oxygen Saturation is 97% on RA, normal by my interpretation.    COORDINATION OF CARE: 12:48 AM-Discussed treatment plan with pt at bedside and pt agreed to plan. Patient was given IV Toradol for presumed chest wall pain. We discussed possible early shingles with pain prior to rash development, also possible rib fracture from coughing, or pneumonia. Patient does not have any abdominal tenderness. She also status post cholecystectomy.  Patient was rechecked after her x-ray. She states the Toradol helped her pain. We discussed that she did have a broken rib. We discussed taking a big deep breath to help prevent pneumonia, we discussed returning if she gets fever, struggling to breathe or seems worse. We also discussed expected course with pain improving over the next 2 weeks but she could still has some discomfort for about 2 months until the rib fracture heals.  I reviewed patient's operatory results in September and she had normal renal function at that time. She was prescribed Naprosyn also for her  pain.  Imaging Review Dg Ribs Unilateral W/chest Right  06/24/2015  CLINICAL DATA:  Right-sided chest pain. Anterior right rib pain. Right upper abdominal and right flank pain all for 3 days. Nausea and diarrhea. Cough. EXAM: RIGHT RIBS AND CHEST - 3+ VIEW COMPARISON:  11/29/2014 FINDINGS: Hyperinflation consistent with emphysema. Central interstitial pattern suggesting chronic bronchitis. Normal heart size and pulmonary vascularity. No focal airspace disease or consolidation in the lungs. No blunting of costophrenic angles. No pneumothorax. Mediastinal contours appear intact. There appears to be nondisplaced fracture of the right anterior ninth rib. No focal bone lesion or bone destruction. IMPRESSION: Emphysematous changes and chronic bronchitic changes in the lungs. Nondisplaced fracture of the right anterior ninth rib. Electronically Signed   By: Lucienne Capers M.D.   On: 06/24/2015 01:55       MDM   Final diagnoses:  Closed rib fracture, right, initial encounter   New Prescriptions   NAPROXEN (NAPROSYN) 250 MG TABLET    Take 1 po BID with food prn pain   Modified Medications   Modified Medication Previous Medication   HYDROCODONE-ACETAMINOPHEN (NORCO/VICODIN) 5-325 MG TABLET HYDROcodone-acetaminophen (NORCO/VICODIN) 5-325 MG per tablet      Take 1 tablet by mouth every 6 (six) hours as needed for moderate pain or severe pain.    Take 1 tablet by mouth every 4 (four) hours as needed.    Plan discharge  Rolland Porter, MD, FACEP   I personally performed the services described in this documentation, which was scribed in my presence. The recorded information has been reviewed and considered.  Rolland Porter, MD, Barbette Or, MD 06/24/15 [phone]

## 2015-06-24 NOTE — Discharge Instructions (Signed)
You have a fracture of your ninth rib. You can use ice over the area for comfort. Take the hydrocodone for pain with the naproxen. Try to take a big deep breath every half hour to help prevent pneumonia. The rib will take 6-8 weeks to heal and the worst pain should be improving in the next 2 weeks. Recheck if you get a fever, struggle to breathe, or you feel worse.   Rib Fracture A rib fracture is a break or crack in one of the bones of the ribs. The ribs are like a cage that goes around your upper chest. A broken or cracked rib is often painful, but most do not cause other problems. Most rib fractures heal on their own in 1-3 months. HOME CARE  Avoid activities that cause pain to the injured area. Protect your injured area.  Slowly increase activity as told by your doctor.  Take medicine as told by your doctor.  Put ice on the injured area for the first 1-2 days after you have been treated or as told by your doctor.  Put ice in a plastic bag.  Place a towel between your skin and the bag.  Leave the ice on for 15-20 minutes at a time, every 2 hours while you are awake.  Do deep breathing as told by your doctor. You may be told to:  Take deep breaths many times a day.  Cough many times a day while hugging a pillow.  Use a device (incentive spirometer) to perform deep breathing many times a day.  Drink enough fluids to keep your pee (urine) clear or pale yellow.   Do not wear a rib belt or binder. These do not allow you to breathe deeply. GET HELP RIGHT AWAY IF:   You have a fever.  You have trouble breathing.   You cannot stop coughing.  You cough up thick or bloody spit (mucus).   You feel sick to your stomach (nauseous), throw up (vomit), or have belly (abdominal) pain.   Your pain gets worse and medicine does not help.  MAKE SURE YOU:   Understand these instructions.  Will watch your condition.  Will get help right away if you are not doing well or get  worse.   This information is not intended to replace advice given to you by your health care provider. Make sure you discuss any questions you have with your health care provider.   Document Released: 02/22/2008 Document Revised: 09/09/2012 Document Reviewed: 07/17/2012 Elsevier Interactive Patient Education [employer].

## 2015-09-06 ENCOUNTER — Encounter (HOSPITAL_COMMUNITY): Payer: Self-pay | Admitting: *Deleted

## 2015-09-06 ENCOUNTER — Emergency Department (HOSPITAL_COMMUNITY)
Admission: EM | Admit: 2015-09-06 | Discharge: 2015-09-06 | Disposition: A | Discharge status: Home / Self Care | Payer: Medicare Other | Attending: Emergency Medicine | Admitting: Emergency Medicine

## 2015-09-06 DIAGNOSIS — K521 Toxic gastroenteritis and colitis: Secondary | ICD-10-CM

## 2015-09-06 DIAGNOSIS — R1084 Generalized abdominal pain: Secondary | ICD-10-CM | POA: Diagnosis present

## 2015-09-06 DIAGNOSIS — M810 Age-related osteoporosis without current pathological fracture: Secondary | ICD-10-CM | POA: Diagnosis not present

## 2015-09-06 DIAGNOSIS — T3695XA Adverse effect of unspecified systemic antibiotic, initial encounter: Secondary | ICD-10-CM

## 2015-09-06 DIAGNOSIS — R197 Diarrhea, unspecified: Secondary | ICD-10-CM | POA: Insufficient documentation

## 2015-09-06 DIAGNOSIS — Z87891 Personal history of nicotine dependence: Secondary | ICD-10-CM | POA: Insufficient documentation

## 2015-09-06 DIAGNOSIS — I10 Essential (primary) hypertension: Secondary | ICD-10-CM | POA: Diagnosis not present

## 2015-09-06 DIAGNOSIS — J449 Chronic obstructive pulmonary disease, unspecified: Secondary | ICD-10-CM | POA: Diagnosis not present

## 2015-09-06 HISTORY — DX: Age-related osteoporosis without current pathological fracture: M81.0

## 2015-09-06 HISTORY — DX: Essential (primary) hypertension: I10

## 2015-09-06 LAB — COMPREHENSIVE METABOLIC PANEL
ALBUMIN: 4.6 g/dL (ref 3.5–5.0)
ALT: 14 U/L (ref 14–54)
ANION GAP: 9 (ref 5–15)
AST: 20 U/L (ref 15–41)
Alkaline Phosphatase: 60 U/L (ref 38–126)
BUN: 10 mg/dL (ref 6–20)
CALCIUM: 8.9 mg/dL (ref 8.9–10.3)
CHLORIDE: 100 mmol/L — AB (ref 101–111)
CO2: 23 mmol/L (ref 22–32)
Creatinine, Ser: 0.59 mg/dL (ref 0.44–1.00)
GFR calc non Af Amer: >60 mL/min (ref >60)
GLUCOSE: 95 mg/dL (ref 65–99)
POTASSIUM: 4.3 mmol/L (ref 3.5–5.1)
SODIUM: 132 mmol/L — AB (ref 135–145)
Total Bilirubin: 1.3 mg/dL — ABNORMAL HIGH (ref 0.3–1.2)
Total Protein: 7.5 g/dL (ref 6.5–8.1)

## 2015-09-06 LAB — CBC WITH DIFFERENTIAL/PLATELET
BASOS PCT: 0 %
Basophils Absolute: 0 10*3/uL (ref 0.0–0.1)
Eosinophils Absolute: 0.1 10*3/uL (ref 0.0–0.7)
Eosinophils Relative: 1 %
HEMATOCRIT: 37.7 % (ref 36.0–46.0)
HEMOGLOBIN: 12.7 g/dL (ref 12.0–15.0)
LYMPHS ABS: 2.1 10*3/uL (ref 0.7–4.0)
LYMPHS PCT: 30 %
MCH: 28.9 pg (ref 26.0–34.0)
MCHC: 33.7 g/dL (ref 30.0–36.0)
MCV: 85.7 fL (ref 78.0–100.0)
MONOS PCT: 7 %
Monocytes Absolute: 0.5 10*3/uL (ref 0.1–1.0)
NEUTROS ABS: 4.4 10*3/uL (ref 1.7–7.7)
NEUTROS PCT: 62 %
Platelets: 355 10*3/uL (ref 150–400)
RBC: 4.4 MIL/uL (ref 3.87–5.11)
RDW: 12.5 % (ref 11.5–15.5)
WBC: 7.1 10*3/uL (ref 4.0–10.5)

## 2015-09-06 LAB — LIPASE, BLOOD: Lipase: 24 U/L (ref 11–51)

## 2015-09-06 MED ORDER — PROMETHAZINE HCL 25 MG PO TABS: 25.0000 mg | ORAL_TABLET | Freq: Four times a day (QID) | ORAL | Status: DC | PRN

## 2015-09-06 MED ORDER — METRONIDAZOLE 500 MG PO TABS: ORAL_TABLET | ORAL | Status: DC

## 2015-09-06 MED ORDER — SODIUM CHLORIDE 0.9 % IV BOLUS (SEPSIS)
1000.0000 mL | Freq: Once | INTRAVENOUS | Status: AC
  Administered 2015-09-06: 1000 mL via INTRAVENOUS

## 2015-09-06 MED ORDER — CIPROFLOXACIN HCL 500 MG PO TABS: 500.0000 mg | ORAL_TABLET | Freq: Two times a day (BID) | ORAL | Status: DC

## 2015-09-06 MED ORDER — PROCHLORPERAZINE EDISYLATE 5 MG/ML IJ SOLN
10.0000 mg | Freq: Once | INTRAMUSCULAR | Status: AC
  Administered 2015-09-06: 10 mg via INTRAVENOUS
  Filled 2015-09-06: qty 2

## 2015-09-06 NOTE — ED Notes (Signed)
Pt reports to physician, that she was on doxycycline for 7 days prior to being put on other antobiotic

## 2015-09-06 NOTE — ED Notes (Signed)
Pt called from waiting room. Pt is not in waiting room and another person in waiting room states that they left.

## 2015-09-06 NOTE — ED Notes (Signed)
Pt comes in for vomiting and diarrhea starting yesterday. She states she is having intermittent abdominal pain.

## 2015-09-06 NOTE — ED Notes (Signed)
Pt reports history of COPD, went to Dr Luan Pulling who put her on 14 days course of Keflex- pt completed Thursday- yesterday with loose stools and today with 8 loose stools- here for evaluation- pt is on home O2 at 2L

## 2015-09-06 NOTE — ED Notes (Signed)
Gave pt our oxygen tank, pt states her home is empty.

## 2015-09-06 NOTE — Discharge Instructions (Signed)
Cipro and Flagyl as prescribed.  Return to the emergency department if you develop severe abdominal pain, bloody stools, high fevers, or other new and concerning symptoms.

## 2015-09-06 NOTE — ED Provider Notes (Signed)
CSN: [id number]     Arrival date & time 09/06/15  1744 History   First MD Initiated Contact with Patient 09/06/15 1821     Chief Complaint  Patient presents with  . Abdominal Pain     (Consider location/radiation/quality/duration/timing/severity/associated sxs/prior Treatment) HPI Comments: Patient is a 65 year old female with history of COPD. She presents for evaluation of generalized abdominal cramping, nausea, vomiting, and diarrhea that has been ongoing for the past 2 days. She reports having difficulty keeping anything down over the last 12 hours. She has recently been treated with Keflex for an extended period of time for a pulmonary infection. Prior to that, she was treated with doxycycline. All of her stools have been nonbloody and she denies any ill contacts.  Patient is a 65 y.o. female presenting with abdominal pain. The history is provided by the patient.  Abdominal Pain Pain location:  Generalized Pain quality: cramping   Pain radiates to:  Does not radiate Pain severity:  Moderate Onset quality:  Gradual Duration:  2 days Timing:  Intermittent Progression:  Worsening Chronicity:  New Relieved by:  Nothing Ineffective treatments:  None tried Associated symptoms: diarrhea, nausea and vomiting     Past Medical History  Diagnosis Date  . Diverticula of intestine   . COPD (chronic obstructive pulmonary disease) ([place])   . Allergy or intolerance to drug     Doxycycline caused worsening shortness of breath and loose stools.  . Chronic neck pain   . Tobacco abuse   . Osteoporosis   . Hypertension    Past Surgical History  Procedure Laterality Date  . Eye surgery    . Laparoscopic appendectomy  07/14/2011    Procedure: APPENDECTOMY LAPAROSCOPIC;  Surgeon: Donato Heinz, MD;  Location: [facility];  Service: General;  Laterality: N/A;  . Appendectomy      2013  . Esophagogastroduodenoscopy N/A 09/09/2014    Procedure: ESOPHAGOGASTRODUODENOSCOPY (EGD);  Surgeon: Rogene Houston, MD;  Location: [facility];  Service: Endoscopy;  Laterality: N/A;  210 - moved to 9:10 - Ann to notify pt  . Cholecystectomy     No family history on file. Social History  Substance Use Topics  . Smoking status: Former Smoker    Types: Cigarettes    Quit date: 09/29/2013  . Smokeless tobacco: None     Comment: smoked since age 48  . Alcohol Use: No   OB History    No data available     Review of Systems  Gastrointestinal: Positive for nausea, vomiting, abdominal pain and diarrhea.  All other systems reviewed and are negative.     Allergies  Sulfa antibiotics; Doxycycline; and Zofran  Home Medications   Prior to Admission medications   Medication Sig Start Date End Date Taking? Authorizing Provider  albuterol (PROVENTIL HFA;VENTOLIN HFA) 108 (90 BASE) MCG/ACT inhaler Inhale into the lungs every 6 (six) hours as needed for wheezing or shortness of breath.    Historical Provider, MD  albuterol (PROVENTIL) (2.5 MG/3ML) 0.083% nebulizer solution Take 2.5 mg by nebulization every 6 (six) hours as needed for wheezing or shortness of breath.    Historical Provider, MD  Aspirin-Acetaminophen-Caffeine (GOODY HEADACHE PO) Take 0.5-1 packets by mouth daily as needed (for pain).    Historical Provider, MD  ciprofloxacin (CIPRO) 500 MG tablet Take 1 tablet (500 mg total) by mouth every 12 (twelve) hours. 02/15/15   Tanna Furry, MD  feeding supplement (BOOST HIGH PROTEIN) LIQD Take 1 Container by mouth daily as needed (  for supplement).    Historical Provider, MD  hydrALAZINE (APRESOLINE) 10 MG tablet TAKE 1 TABLET BY MOUTH EVERY DAY IF BP IS OVER 180 02/08/15   Historical Provider, MD  HYDROcodone-acetaminophen (NORCO/VICODIN) 5-325 MG tablet Take 1 tablet by mouth every 6 (six) hours as needed for moderate pain or severe pain. 06/24/15   Rolland Porter, MD  lisinopril (PRINIVIL,ZESTRIL) 10 MG tablet Take 10 mg by mouth daily.    Historical Provider, MD  metroNIDAZOLE (FLAGYL) 500 MG tablet  Take 1 tablet (500 mg total) by mouth 2 (two) times daily. 02/15/15   Tanna Furry, MD  naproxen (NAPROSYN) 250 MG tablet Take 1 po BID with food prn pain 06/24/15   Rolland Porter, MD  NON FORMULARY Apply 1 application topically daily as needed [facility]).    Historical Provider, MD  promethazine (PHENERGAN) 25 MG tablet Take 1 tablet (25 mg total) by mouth every 6 (six) hours as needed for nausea or vomiting. 02/15/15   Tanna Furry, MD  tiotropium (SPIRIVA HANDIHALER) 18 MCG inhalation capsule Place 18 mcg into inhaler and inhale every morning.     Historical Provider, MD   BP 142/72 mmHg  Pulse 97  Temp(Src) 98 F (36.7 C) (Oral)  Resp 16  Ht 5' 2"  (1.575 m)  Wt 105 lb (47.628 kg)  BMI 19.20 kg/m2  SpO2 94% Physical Exam  Constitutional: She is oriented to person, place, and time. She appears well-developed and well-nourished. No distress.  HENT:  Head: Normocephalic and atraumatic.  Neck: Normal range of motion. Neck supple.  Cardiovascular: Normal rate and regular rhythm.  Exam reveals no gallop and no friction rub.   No murmur heard. Pulmonary/Chest: Effort normal and breath sounds normal. No respiratory distress. She has no wheezes.  Abdominal: Soft. Bowel sounds are normal. She exhibits no distension. There is no tenderness. There is no rebound.  Musculoskeletal: Normal range of motion.  Neurological: She is alert and oriented to person, place, and time.  Skin: Skin is warm and dry. She is not diaphoretic.  Nursing note and vitals reviewed.   ED Course  Procedures (including critical care time) Labs Review Labs Reviewed  URINALYSIS, ROUTINE W REFLEX MICROSCOPIC (NOT AT [facility])    Imaging Review No results found. I have personally reviewed and evaluated these images and lab results as part of my medical decision-making.    MDM   Final diagnoses:  None    Patient presents here with vomiting and diarrhea since yesterday. She is also having intermittent  abdominal cramping. She was initially treated with doxycycline, then Keflex for URI like symptoms. I suspect her symptoms may be related to C. difficile. She will be treated with Cipro and Flagyl, but appears appropriate for discharge.    Veryl Speak, MD 09/06/15 2136

## 2015-09-07 LAB — GASTROINTESTINAL PANEL BY PCR, STOOL (REPLACES STOOL CULTURE)
ADENOVIRUS F40/41: NOT DETECTED
ASTROVIRUS: NOT DETECTED
CRYPTOSPORIDIUM: NOT DETECTED
Campylobacter species: NOT DETECTED
Cyclospora cayetanensis: NOT DETECTED
E. COLI O157: NOT DETECTED
ENTAMOEBA HISTOLYTICA: NOT DETECTED
ENTEROAGGREGATIVE E COLI (EAEC): NOT DETECTED
ENTEROPATHOGENIC E COLI (EPEC): NOT DETECTED
ENTEROTOXIGENIC E COLI (ETEC): NOT DETECTED
GIARDIA LAMBLIA: NOT DETECTED
NOROVIRUS GI/GII: NOT DETECTED
Plesimonas shigelloides: NOT DETECTED
Rotavirus A: NOT DETECTED
SAPOVIRUS (I, II, IV, AND V): NOT DETECTED
SHIGELLA/ENTEROINVASIVE E COLI (EIEC): NOT DETECTED
Salmonella species: NOT DETECTED
Shiga like toxin producing E coli (STEC): NOT DETECTED
VIBRIO CHOLERAE: NOT DETECTED
Vibrio species: NOT DETECTED
Yersinia enterocolitica: NOT DETECTED

## 2016-03-07 ENCOUNTER — Encounter (HOSPITAL_COMMUNITY): Payer: Self-pay

## 2016-03-07 ENCOUNTER — Emergency Department (HOSPITAL_COMMUNITY)
Admission: EM | Admit: 2016-03-07 | Discharge: 2016-03-07 | Disposition: A | Discharge status: Home / Self Care | Payer: Medicare Other | Attending: Emergency Medicine | Admitting: Emergency Medicine

## 2016-03-07 DIAGNOSIS — Z7982 Long term (current) use of aspirin: Secondary | ICD-10-CM | POA: Diagnosis not present

## 2016-03-07 DIAGNOSIS — Z87891 Personal history of nicotine dependence: Secondary | ICD-10-CM | POA: Diagnosis not present

## 2016-03-07 DIAGNOSIS — J449 Chronic obstructive pulmonary disease, unspecified: Secondary | ICD-10-CM | POA: Diagnosis not present

## 2016-03-07 DIAGNOSIS — R51 Headache: Secondary | ICD-10-CM | POA: Insufficient documentation

## 2016-03-07 DIAGNOSIS — R197 Diarrhea, unspecified: Secondary | ICD-10-CM | POA: Diagnosis present

## 2016-03-07 DIAGNOSIS — I10 Essential (primary) hypertension: Secondary | ICD-10-CM | POA: Insufficient documentation

## 2016-03-07 DIAGNOSIS — Z79899 Other long term (current) drug therapy: Secondary | ICD-10-CM | POA: Insufficient documentation

## 2016-03-07 LAB — URINALYSIS, ROUTINE W REFLEX MICROSCOPIC
BILIRUBIN URINE: NEGATIVE
GLUCOSE, UA: NEGATIVE mg/dL
Ketones, ur: NEGATIVE mg/dL
Leukocytes, UA: NEGATIVE
Nitrite: NEGATIVE
Protein, ur: NEGATIVE mg/dL
SPECIFIC GRAVITY, URINE: 1.01 (ref 1.005–1.030)
pH: 7 (ref 5.0–8.0)

## 2016-03-07 LAB — BASIC METABOLIC PANEL
Anion gap: 8 (ref 5–15)
CHLORIDE: 101 mmol/L (ref 101–111)
CO2: 25 mmol/L (ref 22–32)
CREATININE: 0.51 mg/dL (ref 0.44–1.00)
Calcium: 8.9 mg/dL (ref 8.9–10.3)
GFR calc Af Amer: >60 mL/min (ref >60)
GFR calc non Af Amer: >60 mL/min (ref >60)
GLUCOSE: 115 mg/dL — AB (ref 65–99)
POTASSIUM: 3.8 mmol/L (ref 3.5–5.1)
SODIUM: 134 mmol/L — AB (ref 135–145)

## 2016-03-07 LAB — CBC WITH DIFFERENTIAL/PLATELET
BASOS PCT: 0 %
Basophils Absolute: 0 10*3/uL (ref 0.0–0.1)
EOS ABS: 0.2 10*3/uL (ref 0.0–0.7)
Eosinophils Relative: 2 %
HCT: 32.5 % — ABNORMAL LOW (ref 36.0–46.0)
HEMOGLOBIN: 11 g/dL — AB (ref 12.0–15.0)
Lymphocytes Relative: 12 %
Lymphs Abs: 1 10*3/uL (ref 0.7–4.0)
MCH: 28.9 pg (ref 26.0–34.0)
MCHC: 33.8 g/dL (ref 30.0–36.0)
MCV: 85.3 fL (ref 78.0–100.0)
Monocytes Absolute: 0.6 10*3/uL (ref 0.1–1.0)
Monocytes Relative: 7 %
NEUTROS PCT: 79 %
Neutro Abs: 6.9 10*3/uL (ref 1.7–7.7)
PLATELETS: 417 10*3/uL — AB (ref 150–400)
RBC: 3.81 MIL/uL — AB (ref 3.87–5.11)
RDW: 12.7 % (ref 11.5–15.5)
WBC: 8.8 10*3/uL (ref 4.0–10.5)

## 2016-03-07 LAB — URINE MICROSCOPIC-ADD ON
BACTERIA UA: NONE SEEN
Squamous Epithelial / LPF: NONE SEEN
WBC, UA: NONE SEEN WBC/hpf (ref 0–5)

## 2016-03-07 MED ORDER — LOPERAMIDE HCL 2 MG PO CAPS: 2.0000 mg | ORAL_CAPSULE | Freq: Four times a day (QID) | ORAL | 0 refills | Status: AC | PRN

## 2016-03-07 MED ORDER — ACETAMINOPHEN 500 MG PO TABS
1000.0000 mg | ORAL_TABLET | Freq: Once | ORAL | Status: AC
  Administered 2016-03-07: 1000 mg via ORAL
  Filled 2016-03-07: qty 2

## 2016-03-07 MED ORDER — SODIUM CHLORIDE 0.9 % IV BOLUS (SEPSIS)
1000.0000 mL | Freq: Once | INTRAVENOUS | Status: AC
  Administered 2016-03-07: 1000 mL via INTRAVENOUS

## 2016-03-07 NOTE — ED Triage Notes (Signed)
Pt was seen at urgent care yesterday, given bag of fluids, offered by provider to be admitted to hospital, but pt did not want to as she was feeling better after receiving fluids. Pt called EMS prior to arrival D/T weakness and diarrhea.

## 2016-03-07 NOTE — ED Notes (Signed)
Pt has been to bathroom twice with no bowel movements or diarrhea noted.

## 2016-03-07 NOTE — ED Notes (Signed)
Pt ambulated without difficulty- standby assist only.

## 2016-03-07 NOTE — ED Provider Notes (Signed)
[facility] DEPT Provider Note   CSN: [id number] Arrival date & time: 03/07/16  0409     History   Chief Complaint Chief Complaint  Patient presents with  . Diarrhea  . Headache    HPI Stacey Nash is a 65 y.o. female.  HPI  This is a 65 year old female with a history of COPD on home oxygen, diverticulosis, hypertension who presents with diarrhea. Patient reports four-day history of diarrhea. She denies blood in her stools. She denies vomiting or abdominal pain. She states that she feels like she has been dehydrated. She was seen at urgent care yesterday morning and given fluids. Denies urinary symptoms or fevers. States that she felt a little bit better after fluids but then felt weak again as diarrhea persisted at home. She has not taken anything for her diarrhea. She reports generalized weakness. Denies dizziness. Denies chest pain or shortness of breath. Patient denies any recent travel recent antibiotic use.  Past Medical History:  Diagnosis Date  . Allergy or intolerance to drug    Doxycycline caused worsening shortness of breath and loose stools.  . Chronic neck pain   . COPD (chronic obstructive pulmonary disease) ([place])   . Diverticula of intestine   . Hypertension   . Osteoporosis   . Tobacco abuse     Patient Active Problem List   Diagnosis Date Noted  . COPD exacerbation ([place]) 04/14/2013  . Hyperglycemia, drug-induced 04/14/2013  . Acute respiratory failure with hypoxia ([place]) 04/14/2013  . Hypokalemia 04/14/2013  . Allergy or intolerance to drug 04/14/2013  . Tobacco abuse 04/14/2013    Past Surgical History:  Procedure Laterality Date  . APPENDECTOMY     2013  . CHOLECYSTECTOMY    . ESOPHAGOGASTRODUODENOSCOPY N/A 09/09/2014   Procedure: ESOPHAGOGASTRODUODENOSCOPY (EGD);  Surgeon: Rogene Houston, MD;  Location: [facility];  Service: Endoscopy;  Laterality: N/A;  210 - moved to 9:10 - Ann to notify pt  . EYE SURGERY    . LAPAROSCOPIC  APPENDECTOMY  07/14/2011   Procedure: APPENDECTOMY LAPAROSCOPIC;  Surgeon: Donato Heinz, MD;  Location: [facility];  Service: General;  Laterality: N/A;    OB History    No data available       Home Medications    Prior to Admission medications   Medication Sig Start Date End Date Taking? Authorizing Provider  albuterol (PROVENTIL HFA;VENTOLIN HFA) 108 (90 BASE) MCG/ACT inhaler Inhale into the lungs every 6 (six) hours as needed for wheezing or shortness of breath.    Historical Provider, MD  albuterol (PROVENTIL) (2.5 MG/3ML) 0.083% nebulizer solution Take 2.5 mg by nebulization every 6 (six) hours as needed for wheezing or shortness of breath.    Historical Provider, MD  Aspirin-Acetaminophen-Caffeine (GOODY HEADACHE PO) Take 0.5-1 packets by mouth daily as needed (for pain).    Historical Provider, MD  cephALEXin (KEFLEX) 500 MG capsule Take 500 mg by mouth 3 (three) times daily. Reported on 09/06/2015 08/23/15   Historical Provider, MD  ciprofloxacin (CIPRO) 500 MG tablet Take 1 tablet (500 mg total) by mouth 2 (two) times daily. One po bid x 7 days 09/06/15   Veryl Speak, MD  feeding supplement (BOOST HIGH PROTEIN) LIQD Take 1 Container by mouth daily as needed (for supplement).    Historical Provider, MD  fluconazole (DIFLUCAN) 150 MG tablet Take 150 mg by mouth once as needed. 08/30/15   Historical Provider, MD  hydrALAZINE (APRESOLINE) 10 MG tablet TAKE 0.5 TABLET BY MOUTH EVERY DAY IF BP  IS OVER 180 02/08/15   Historical Provider, MD  lisinopril (PRINIVIL,ZESTRIL) 10 MG tablet Take 10 mg by mouth daily.    Historical Provider, MD  lisinopril (PRINIVIL,ZESTRIL) 5 MG tablet TAKE 1 TABLET BY MOUTH ONCE A DAY UNLESS BP IS OVER 161. IF BLOOD PRESSURE EXCEEDS 161, TAKE 7.5MG. MAY TAKE UP TO 10MG AND 5MG OF HYDRALAZINE FOR HIGH BLOOD PRESSURE LEVELS 08/12/15   Historical Provider, MD  loperamide (IMODIUM) 2 MG capsule Take 1 capsule (2 mg total) by mouth 4 (four) times daily as needed for diarrhea  or loose stools. 03/07/16   Merryl Hacker, MD  Menthol, Topical Analgesic, (BLUE-EMU MAXIMUM STRENGTH EX) Apply 1 application topically daily as needed (FOR PAIN).    Historical Provider, MD  metroNIDAZOLE (FLAGYL) 500 MG tablet One po tid x 7 days 09/06/15   Veryl Speak, MD  nystatin cream (MYCOSTATIN) Apply 1 application topically 2 (two) times daily.  08/30/15   Historical Provider, MD  promethazine (PHENERGAN) 25 MG tablet Take 1 tablet (25 mg total) by mouth every 6 (six) hours as needed for nausea. 09/06/15   Veryl Speak, MD  tiotropium (SPIRIVA HANDIHALER) 18 MCG inhalation capsule Place 18 mcg into inhaler and inhale every evening.     Historical Provider, MD    Family History No family history on file.  Social History Social History  Substance Use Topics  . Smoking status: Former Smoker    Types: Cigarettes    Quit date: 09/29/2013  . Smokeless tobacco: Never Used     Comment: smoked since age 86; using vapor pen now  . Alcohol use No     Allergies   Sulfa antibiotics; Doxycycline; and Zofran [ondansetron hcl]   Review of Systems Review of Systems  Constitutional: Negative for fever.  Respiratory: Negative for shortness of breath.   Cardiovascular: Negative for chest pain.  Gastrointestinal: Positive for diarrhea. Negative for abdominal pain, nausea and vomiting.  Genitourinary: Negative for dysuria and flank pain.  All other systems reviewed and are negative.    Physical Exam Updated Vital Signs BP 136/73   Pulse 99   Temp 97.9 F (36.6 C) (Oral)   Resp 17   Ht 5' 2"  (1.575 m)   Wt 102 lb (46.3 kg)   SpO2 95%   BMI 18.66 kg/m   Physical Exam  Constitutional: She is oriented to person, place, and time.  Elderly, chronically ill-appearing, no acute distress, nasal cannula in place  HENT:  Head: Normocephalic and atraumatic.  Mucous membranes dry  Eyes: Pupils are equal, round, and reactive to light.  Cardiovascular: Normal rate, regular rhythm and  normal heart sounds.   No murmur heard. Pulmonary/Chest: Effort normal. No respiratory distress. She has wheezes.  Coarse breath sounds bilaterally with scant wheezing, fair air movement  Abdominal: Soft. Bowel sounds are normal. She exhibits no distension and no mass. There is no tenderness. There is no guarding.  Neurological: She is alert and oriented to person, place, and time.  Skin: Skin is warm and dry.  Psychiatric: She has a normal mood and affect.  Nursing note and vitals reviewed.    ED Treatments / Results  Labs (all labs ordered are listed, but only abnormal results are displayed) Labs Reviewed  CBC WITH DIFFERENTIAL/PLATELET - Abnormal; Notable for the following:       Result Value   RBC 3.81 (*)    Hemoglobin 11.0 (*)    HCT 32.5 (*)    Platelets 417 (*)  All other components within normal limits  BASIC METABOLIC PANEL - Abnormal; Notable for the following:    Sodium 134 (*)    Glucose, Bld 115 (*)    BUN <5 (*)    All other components within normal limits  URINALYSIS, ROUTINE W REFLEX MICROSCOPIC (NOT AT [facility]) - Abnormal; Notable for the following:    Hgb urine dipstick TRACE (*)    All other components within normal limits  URINE MICROSCOPIC-ADD ON    EKG  EKG Interpretation None       Radiology No results found.  Procedures Procedures (including critical care time)  Medications Ordered in ED Medications  sodium chloride 0.9 % bolus 1,000 mL (0 mLs Intravenous Stopped 03/07/16 0645)  acetaminophen (TYLENOL) tablet 1,000 mg (1,000 mg Oral Given 03/07/16 0504)     Initial Impression / Assessment and Plan / ED Course  I have reviewed the triage vital signs and the nursing notes.  Pertinent labs & imaging results that were available during my care of the patient were reviewed by me and considered in my medical decision making (see chart for details).  Clinical Course    Patient presents with 4 days of diarrhea. No other associated symptoms.  Reports she is generally weak. Vital signs reassuring. She is on home oxygen requirement. Abdomen is soft and without pain or tenderness. Patient was given fluids. Orthostatics negative. Lab work is reassuring including urinalysis. She's had no further diarrhea here and reports that she feels much better after fluids. I discussed with the patient that she may need further evaluation if diarrhea continues including stool studies. She states standing. She is ambulatory independently. Will give Imodium for loose stools.  After history, exam, and medical workup I feel the patient has been appropriately medically screened and is safe for discharge home. Pertinent diagnoses were discussed with the patient. Patient was given return precautions.   Final Clinical Impressions(s) / ED Diagnoses   Final diagnoses:  Diarrhea, unspecified type    New Prescriptions New Prescriptions   LOPERAMIDE (IMODIUM) 2 MG CAPSULE    Take 1 capsule (2 mg total) by mouth 4 (four) times daily as needed for diarrhea or loose stools.     Merryl Hacker, MD 03/07/16 [phone]

## 2016-04-04 ENCOUNTER — Other Ambulatory Visit (HOSPITAL_COMMUNITY): Payer: Self-pay | Admitting: Physician Assistant

## 2016-04-04 DIAGNOSIS — Z1231 Encounter for screening mammogram for malignant neoplasm of breast: Secondary | ICD-10-CM

## 2016-04-27 ENCOUNTER — Ambulatory Visit (HOSPITAL_COMMUNITY): Payer: Medicare Other

## 2016-05-17 ENCOUNTER — Ambulatory Visit (HOSPITAL_COMMUNITY): Payer: Medicare Other

## 2016-05-31 ENCOUNTER — Ambulatory Visit (HOSPITAL_COMMUNITY): Payer: Medicare Other

## 2016-07-05 ENCOUNTER — Ambulatory Visit (HOSPITAL_COMMUNITY)
Admission: RE | Admit: 2016-07-05 | Discharge: 2016-07-05 | Disposition: A | Discharge status: Home / Self Care | Payer: Medicare Other | Source: Ambulatory Visit | Attending: Physician Assistant | Admitting: Physician Assistant

## 2016-07-05 DIAGNOSIS — Z1231 Encounter for screening mammogram for malignant neoplasm of breast: Secondary | ICD-10-CM | POA: Insufficient documentation

## 2016-07-13 DIAGNOSIS — Z713 Dietary counseling and surveillance: Secondary | ICD-10-CM | POA: Diagnosis not present

## 2016-07-13 DIAGNOSIS — Z6821 Body mass index (BMI) 21.0-21.9, adult: Secondary | ICD-10-CM | POA: Diagnosis not present

## 2016-07-13 DIAGNOSIS — I10 Essential (primary) hypertension: Secondary | ICD-10-CM | POA: Diagnosis not present

## 2016-07-13 DIAGNOSIS — R002 Palpitations: Secondary | ICD-10-CM | POA: Diagnosis not present

## 2016-07-13 DIAGNOSIS — E784 Other hyperlipidemia: Secondary | ICD-10-CM | POA: Diagnosis not present

## 2016-07-24 DIAGNOSIS — Z1211 Encounter for screening for malignant neoplasm of colon: Secondary | ICD-10-CM | POA: Diagnosis not present

## 2016-09-21 ENCOUNTER — Emergency Department (HOSPITAL_COMMUNITY)
Admission: EM | Admit: 2016-09-21 | Discharge: 2016-09-21 | Disposition: A | Discharge status: Home / Self Care | Payer: Medicare Other | Attending: Emergency Medicine | Admitting: Emergency Medicine

## 2016-09-21 ENCOUNTER — Emergency Department (HOSPITAL_COMMUNITY): Payer: Medicare Other

## 2016-09-21 ENCOUNTER — Encounter (HOSPITAL_COMMUNITY): Payer: Self-pay | Admitting: Emergency Medicine

## 2016-09-21 DIAGNOSIS — Z87891 Personal history of nicotine dependence: Secondary | ICD-10-CM | POA: Insufficient documentation

## 2016-09-21 DIAGNOSIS — R109 Unspecified abdominal pain: Secondary | ICD-10-CM | POA: Diagnosis not present

## 2016-09-21 DIAGNOSIS — J45909 Unspecified asthma, uncomplicated: Secondary | ICD-10-CM | POA: Diagnosis not present

## 2016-09-21 DIAGNOSIS — I10 Essential (primary) hypertension: Secondary | ICD-10-CM | POA: Diagnosis not present

## 2016-09-21 DIAGNOSIS — R1013 Epigastric pain: Secondary | ICD-10-CM | POA: Diagnosis not present

## 2016-09-21 DIAGNOSIS — J449 Chronic obstructive pulmonary disease, unspecified: Secondary | ICD-10-CM | POA: Insufficient documentation

## 2016-09-21 DIAGNOSIS — Z79899 Other long term (current) drug therapy: Secondary | ICD-10-CM | POA: Diagnosis not present

## 2016-09-21 DIAGNOSIS — R140 Abdominal distension (gaseous): Secondary | ICD-10-CM | POA: Insufficient documentation

## 2016-09-21 DIAGNOSIS — R1010 Upper abdominal pain, unspecified: Secondary | ICD-10-CM | POA: Diagnosis not present

## 2016-09-21 DIAGNOSIS — I160 Hypertensive urgency: Secondary | ICD-10-CM | POA: Diagnosis not present

## 2016-09-21 HISTORY — DX: Unspecified asthma, uncomplicated: J45.909

## 2016-09-21 LAB — URINALYSIS, ROUTINE W REFLEX MICROSCOPIC
Bacteria, UA: NONE SEEN
Bilirubin Urine: NEGATIVE
GLUCOSE, UA: NEGATIVE mg/dL
KETONES UR: NEGATIVE mg/dL
LEUKOCYTES UA: NEGATIVE
Nitrite: NEGATIVE
PH: 6 (ref 5.0–8.0)
Protein, ur: NEGATIVE mg/dL
Specific Gravity, Urine: 1.004 — ABNORMAL LOW (ref 1.005–1.030)

## 2016-09-21 LAB — CBC
HEMATOCRIT: 41 % (ref 36.0–46.0)
Hemoglobin: 14 g/dL (ref 12.0–15.0)
MCH: 28.9 pg (ref 26.0–34.0)
MCHC: 34.1 g/dL (ref 30.0–36.0)
MCV: 84.5 fL (ref 78.0–100.0)
PLATELETS: 382 10*3/uL (ref 150–400)
RBC: 4.85 MIL/uL (ref 3.87–5.11)
RDW: 13 % (ref 11.5–15.5)
WBC: 8.8 10*3/uL (ref 4.0–10.5)

## 2016-09-21 LAB — COMPREHENSIVE METABOLIC PANEL
ALT: 19 U/L (ref 14–54)
ANION GAP: 8 (ref 5–15)
AST: 18 U/L (ref 15–41)
Albumin: 4.8 g/dL (ref 3.5–5.0)
Alkaline Phosphatase: 75 U/L (ref 38–126)
BUN: 5 mg/dL — ABNORMAL LOW (ref 6–20)
CHLORIDE: 101 mmol/L (ref 101–111)
CO2: 24 mmol/L (ref 22–32)
Calcium: 9.5 mg/dL (ref 8.9–10.3)
Creatinine, Ser: 0.61 mg/dL (ref 0.44–1.00)
Glucose, Bld: 101 mg/dL — ABNORMAL HIGH (ref 65–99)
POTASSIUM: 3.7 mmol/L (ref 3.5–5.1)
Sodium: 133 mmol/L — ABNORMAL LOW (ref 135–145)
TOTAL PROTEIN: 7.5 g/dL (ref 6.5–8.1)
Total Bilirubin: 1.2 mg/dL (ref 0.3–1.2)

## 2016-09-21 LAB — LIPASE, BLOOD: Lipase: 24 U/L (ref 11–51)

## 2016-09-21 MED ORDER — IOPAMIDOL (ISOVUE-300) INJECTION 61%
INTRAVENOUS | Status: AC
  Filled 2016-09-21: qty 30

## 2016-09-21 MED ORDER — IOPAMIDOL (ISOVUE-300) INJECTION 61%
75.0000 mL | Freq: Once | INTRAVENOUS | Status: AC | PRN
  Administered 2016-09-21: 75 mL via INTRAVENOUS

## 2016-09-21 MED ORDER — ACETAMINOPHEN 325 MG PO TABS
650.0000 mg | ORAL_TABLET | Freq: Once | ORAL | Status: AC
  Administered 2016-09-21: 650 mg via ORAL
  Filled 2016-09-21: qty 2

## 2016-09-21 NOTE — ED Provider Notes (Signed)
[facility] DEPT Provider Note   CSN: [id number] Arrival date & time: 09/21/16  1501     History   Chief Complaint Chief Complaint  Patient presents with  . Hypertension  . Abdominal Pain    HPI Stacey Nash is a 66 y.o. female.  Patient presents from primary care office with a concern of upper abdominal pain for 6 days. She describes the discomfort as a bloating sensation. She has been eating without vomiting. Loose stool, but no watery diarrhea. Additionally, her blood pressures been fluctuating from 338-250 systolic range. No chest pain or dyspnea. She takes additional medication at home when her blood pressure is elevated.      Past Medical History:  Diagnosis Date  . Allergy or intolerance to drug    Doxycycline caused worsening shortness of breath and loose stools.  . Asthma   . Chronic neck pain   . COPD (chronic obstructive pulmonary disease) ([place])   . Diverticula of intestine   . Hypertension   . Osteoporosis   . Tobacco abuse     Patient Active Problem List   Diagnosis Date Noted  . COPD exacerbation ([place]) 04/14/2013  . Hyperglycemia, drug-induced 04/14/2013  . Acute respiratory failure with hypoxia ([place]) 04/14/2013  . Hypokalemia 04/14/2013  . Allergy or intolerance to drug 04/14/2013  . Tobacco abuse 04/14/2013    Past Surgical History:  Procedure Laterality Date  . APPENDECTOMY     2013  . CHOLECYSTECTOMY    . ESOPHAGOGASTRODUODENOSCOPY N/A 09/09/2014   Procedure: ESOPHAGOGASTRODUODENOSCOPY (EGD);  Surgeon: Rogene Houston, MD;  Location: [facility];  Service: Endoscopy;  Laterality: N/A;  210 - moved to 9:10 - Ann to notify pt  . EYE SURGERY    . LAPAROSCOPIC APPENDECTOMY  07/14/2011   Procedure: APPENDECTOMY LAPAROSCOPIC;  Surgeon: Donato Heinz, MD;  Location: [facility] ORS;  Service: General;  Laterality: N/A;    OB History    No data available       Home Medications    Prior to Admission medications   Medication Sig Start  Date End Date Taking? Authorizing Provider  albuterol (PROVENTIL HFA;VENTOLIN HFA) 108 (90 BASE) MCG/ACT inhaler Inhale into the lungs every 6 (six) hours as needed for wheezing or shortness of breath.   Yes Historical Provider, MD  albuterol (PROVENTIL) (2.5 MG/3ML) 0.083% nebulizer solution Take 2.5 mg by nebulization every 6 (six) hours as needed for wheezing or shortness of breath.   Yes Historical Provider, MD  Aspirin-Acetaminophen-Caffeine (GOODY HEADACHE PO) Take 0.5-1 packets by mouth daily as needed (for pain).   Yes Historical Provider, MD  Calcium Carbonate-Vit D-Min (CALTRATE 600+D PLUS MINERALS) 600-800 MG-UNIT CHEW Chew 1 tablet by mouth daily.   Yes Historical Provider, MD  Cholecalciferol (VITAMIN D3) 1000 units CAPS Take 1 capsule by mouth daily.   Yes Historical Provider, MD  lisinopril (PRINIVIL,ZESTRIL) 5 MG tablet TAKE 1 TABLET BY MOUTH ONCE A DAY UNLESS BP IS OVER 161. IF BLOOD PRESSURE EXCEEDS 161, TAKE 7.5MG. MAY TAKE UP TO 10MG AND 5MG OF HYDRALAZINE FOR HIGH BLOOD PRESSURE LEVELS 08/12/15  Yes Historical Provider, MD  Menthol, Topical Analgesic, (BLUE-EMU MAXIMUM STRENGTH EX) Apply 1 application topically daily as needed (FOR PAIN).   Yes Historical Provider, MD  nystatin cream (MYCOSTATIN) Apply 1 application topically 2 (two) times daily.  08/30/15  Yes Historical Provider, MD  tiotropium (SPIRIVA HANDIHALER) 18 MCG inhalation capsule Place 18 mcg into inhaler and inhale every evening.    Yes Historical Provider, MD  cephALEXin (KEFLEX) 500 MG capsule Take 500 mg by mouth 3 (three) times daily. Reported on 09/06/2015 08/23/15   Historical Provider, MD  ciprofloxacin (CIPRO) 500 MG tablet Take 1 tablet (500 mg total) by mouth 2 (two) times daily. One po bid x 7 days Patient not taking: Reported on 09/21/2016 09/06/15   Veryl Speak, MD  DOCOSAHEXAENOIC ACID PO Take 1 capsule by mouth daily.    Historical Provider, MD  feeding supplement (BOOST HIGH PROTEIN) LIQD Take 1 Container by  mouth daily as needed (for supplement).    Historical Provider, MD  fluconazole (DIFLUCAN) 150 MG tablet Take 150 mg by mouth once as needed. 08/30/15   Historical Provider, MD  hydrALAZINE (APRESOLINE) 10 MG tablet TAKE 0.5 TABLET BY MOUTH EVERY DAY IF BP IS OVER 180 02/08/15   Historical Provider, MD  lisinopril (PRINIVIL,ZESTRIL) 10 MG tablet Take 10 mg by mouth daily.    Historical Provider, MD  loperamide (IMODIUM) 2 MG capsule Take 1 capsule (2 mg total) by mouth 4 (four) times daily as needed for diarrhea or loose stools. Patient not taking: Reported on 09/21/2016 03/07/16   Merryl Hacker, MD  metroNIDAZOLE (FLAGYL) 500 MG tablet One po tid x 7 days Patient not taking: Reported on 09/21/2016 09/06/15   Veryl Speak, MD  promethazine (PHENERGAN) 25 MG tablet Take 1 tablet (25 mg total) by mouth every 6 (six) hours as needed for nausea. Patient not taking: Reported on 09/21/2016 09/06/15   Veryl Speak, MD    Family History History reviewed. No pertinent family history.  Social History Social History  Substance Use Topics  . Smoking status: Former Smoker    Types: Cigarettes    Quit date: 09/29/2013  . Smokeless tobacco: Never Used  . Alcohol use No     Allergies   Sulfa antibiotics; Doxycycline; and Zofran [ondansetron hcl]   Review of Systems Review of Systems  All other systems reviewed and are negative.    Physical Exam Updated Vital Signs BP (!) 197/98 (BP Location: Left Arm)   Pulse 87   Temp 97.9 F (36.6 C) (Oral)   Resp 20   Ht 5' 2"  (1.575 m)   Wt 110 lb (49.9 kg)   SpO2 100%   BMI 20.12 kg/m   Physical Exam  Constitutional: She is oriented to person, place, and time. She appears well-developed and well-nourished.  HENT:  Head: Normocephalic and atraumatic.  Eyes: Conjunctivae are normal.  Neck: Neck supple.  Cardiovascular: Normal rate and regular rhythm.   Pulmonary/Chest: Effort normal and breath sounds normal.  Abdominal:  Minimal upper abdominal  tenderness  Musculoskeletal: Normal range of motion.  Neurological: She is alert and oriented to person, place, and time.  Skin: Skin is warm and dry.  Psychiatric: She has a normal mood and affect. Her behavior is normal.  Nursing note and vitals reviewed.    ED Treatments / Results  Labs (all labs ordered are listed, but only abnormal results are displayed) Labs Reviewed  COMPREHENSIVE METABOLIC PANEL - Abnormal; Notable for the following:       Result Value   Sodium 133 (*)    Glucose, Bld 101 (*)    BUN 5 (*)    All other components within normal limits  URINALYSIS, ROUTINE W REFLEX MICROSCOPIC - Abnormal; Notable for the following:    Color, Urine STRAW (*)    Specific Gravity, Urine 1.004 (*)    Hgb urine dipstick SMALL (*)    Squamous Epithelial /  LPF 0-5 (*)    All other components within normal limits  CBC  LIPASE, BLOOD    EKG  EKG Interpretation None       Radiology Dg Chest 2 View  Result Date: 09/21/2016 CLINICAL DATA:  Abdominal pain.  Hypertension. EXAM: CHEST  2 VIEW COMPARISON:  07/12/2015 FINDINGS: The cardiomediastinal silhouette is within normal limits. The lungs remain hyperinflated. No airspace consolidation, edema, sizable pleural effusion, or pneumothorax is identified. No acute osseous abnormality is seen. IMPRESSION: No active cardiopulmonary disease. Electronically Signed   By: Logan Bores M.D.   On: 09/21/2016 15:32   Ct Abdomen Pelvis W Contrast  Result Date: 09/21/2016 CLINICAL DATA:  Upper abdominal pain and distension for a week, worse after eating. History of appendectomy, cholecystectomy, colitis. EXAM: CT ABDOMEN AND PELVIS WITH CONTRAST TECHNIQUE: Multidetector CT imaging of the abdomen and pelvis was performed using the standard protocol following bolus administration of intravenous contrast. CONTRAST:  64m ISOVUE-300 IOPAMIDOL (ISOVUE-300) INJECTION 61% COMPARISON:  CT abdomen and pelvis February 15, 2015 FINDINGS: LOWER CHEST: Mild  LEFT basilar atelectasis. Included heart size is normal. No pericardial effusion. HEPATOBILIARY: Status post cholecystectomy.  Normal liver. PANCREAS: Normal. SPLEEN: Normal. ADRENALS/URINARY TRACT: Kidneys are orthotopic, demonstrating symmetric enhancement. No nephrolithiasis, hydronephrosis or solid renal masses. The unopacified ureters are normal in course and caliber. Delayed imaging through the kidneys demonstrates symmetric prompt contrast excretion within the proximal urinary collecting system. Urinary bladder is partially distended and unremarkable. Stable 3.4 x 3.3 cm LEFT adrenal mass with coarse calcifications and tiny foci of fat density. Stable 7 mm RIGHT adrenal nodule. STOMACH/BOWEL: Moderate predominately LEFT colon diverticulosis. Mild sigmoid descending and wall thickening compatible with prior bouts of diverticulitis/colitis without acute component. The stomach, small bowel are normal in course and caliber without inflammatory changes. VASCULAR/LYMPHATIC: Aortoiliac vessels are normal in course and caliber, mild calcific atherosclerosis. No lymphadenopathy by CT size criteria. REPRODUCTIVE: Normal.  Tubal ligation clips noted. OTHER: No intraperitoneal free fluid or free air. MUSCULOSKELETAL: Nonacute.  Anterior abdominal wall scarring. IMPRESSION: Diverticulosis without acute diverticulitis nor acute intra-abdominal/pelvic process. 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 Electronically Signed   By: CElon AlasM.D.   On: 09/21/2016 19:59    Procedures Procedures (including critical care time)  Medications Ordered in ED Medications  iopamidol (ISOVUE-300) 61 % injection (not administered)  acetaminophen (TYLENOL) tablet 650 mg (650 mg Oral Given 09/21/16 1854)  iopamidol (ISOVUE-300) 61 % injection 75 mL (75 mLs Intravenous Contrast Given 09/21/16 1933)     Initial Impression / Assessment and Plan / ED Course  I have reviewed the triage vital signs  and the nursing notes.  Pertinent labs & imaging results that were available during my care of the patient were reviewed by me and considered in my medical decision making (see chart for details).     Physical exam shows no acute abdomen. Patient is minimally tender in epigastrium. No clinical evidence of aortic dissection. CT of abdomen/pelvis shows diverticulosis but no diverticulitis. Labs including white count, liver functions, lipase all normal.  Final Clinical Impressions(s) / ED Diagnoses   Final diagnoses:  Pain of upper abdomen    New Prescriptions New Prescriptions   No medications on file     BNat Christen MD 09/21/16 2117

## 2016-09-21 NOTE — Discharge Instructions (Signed)
Tests including CBC, chemistry, lipase, CT scan of abdomen pelvis show no life-threatening condition. Follow up with your primary care doctor or return if worse.

## 2016-09-21 NOTE — ED Notes (Signed)
Pt wheeled to waiting room. Pt verbalized understanding of discharge instructions.   

## 2016-09-21 NOTE — ED Triage Notes (Signed)
Pt sent from [facility] center for evaluation of HTN and to "rule out aortic dissection".  Pt states she occasionally has some abd pressure, but not enough to call pain.  Took bp meds pta.

## 2016-09-21 NOTE — ED Notes (Signed)
Patient voided before UA. Will collect after CT.

## 2016-09-21 NOTE — ED Notes (Signed)
Patient unable to get urine sample at this time.

## 2016-12-01 DIAGNOSIS — J449 Chronic obstructive pulmonary disease, unspecified: Secondary | ICD-10-CM | POA: Diagnosis not present

## 2016-12-01 DIAGNOSIS — K210 Gastro-esophageal reflux disease with esophagitis: Secondary | ICD-10-CM | POA: Diagnosis not present

## 2016-12-01 DIAGNOSIS — I10 Essential (primary) hypertension: Secondary | ICD-10-CM | POA: Diagnosis not present

## 2016-12-01 DIAGNOSIS — J9611 Chronic respiratory failure with hypoxia: Secondary | ICD-10-CM | POA: Diagnosis not present

## 2016-12-14 DIAGNOSIS — I10 Essential (primary) hypertension: Secondary | ICD-10-CM | POA: Diagnosis not present

## 2016-12-14 DIAGNOSIS — Z6821 Body mass index (BMI) 21.0-21.9, adult: Secondary | ICD-10-CM | POA: Diagnosis not present

## 2016-12-14 DIAGNOSIS — Z713 Dietary counseling and surveillance: Secondary | ICD-10-CM | POA: Diagnosis not present

## 2016-12-14 DIAGNOSIS — J449 Chronic obstructive pulmonary disease, unspecified: Secondary | ICD-10-CM | POA: Diagnosis not present

## 2017-04-25 DIAGNOSIS — I10 Essential (primary) hypertension: Secondary | ICD-10-CM | POA: Diagnosis not present

## 2017-04-25 DIAGNOSIS — J449 Chronic obstructive pulmonary disease, unspecified: Secondary | ICD-10-CM | POA: Diagnosis not present

## 2017-04-25 DIAGNOSIS — N760 Acute vaginitis: Secondary | ICD-10-CM | POA: Diagnosis not present

## 2017-04-25 DIAGNOSIS — Z6822 Body mass index (BMI) 22.0-22.9, adult: Secondary | ICD-10-CM | POA: Diagnosis not present

## 2017-04-25 DIAGNOSIS — E782 Mixed hyperlipidemia: Secondary | ICD-10-CM | POA: Diagnosis not present

## 2017-04-25 DIAGNOSIS — Z131 Encounter for screening for diabetes mellitus: Secondary | ICD-10-CM | POA: Diagnosis not present

## 2017-06-05 DIAGNOSIS — K5792 Diverticulitis of intestine, part unspecified, without perforation or abscess without bleeding: Secondary | ICD-10-CM | POA: Diagnosis not present

## 2017-06-05 DIAGNOSIS — Z881 Allergy status to other antibiotic agents status: Secondary | ICD-10-CM | POA: Diagnosis not present

## 2017-06-05 DIAGNOSIS — R1032 Left lower quadrant pain: Secondary | ICD-10-CM | POA: Diagnosis not present

## 2017-06-05 DIAGNOSIS — R109 Unspecified abdominal pain: Secondary | ICD-10-CM | POA: Diagnosis not present

## 2017-06-05 DIAGNOSIS — Z87891 Personal history of nicotine dependence: Secondary | ICD-10-CM | POA: Diagnosis not present

## 2017-06-05 DIAGNOSIS — Z888 Allergy status to other drugs, medicaments and biological substances status: Secondary | ICD-10-CM | POA: Diagnosis not present

## 2017-06-05 DIAGNOSIS — J449 Chronic obstructive pulmonary disease, unspecified: Secondary | ICD-10-CM | POA: Diagnosis not present

## 2017-06-05 DIAGNOSIS — Z882 Allergy status to sulfonamides status: Secondary | ICD-10-CM | POA: Diagnosis not present

## 2017-06-05 DIAGNOSIS — I10 Essential (primary) hypertension: Secondary | ICD-10-CM | POA: Diagnosis not present

## 2017-06-08 DIAGNOSIS — J9611 Chronic respiratory failure with hypoxia: Secondary | ICD-10-CM | POA: Diagnosis not present

## 2017-06-08 DIAGNOSIS — I499 Cardiac arrhythmia, unspecified: Secondary | ICD-10-CM | POA: Diagnosis not present

## 2017-06-08 DIAGNOSIS — I10 Essential (primary) hypertension: Secondary | ICD-10-CM | POA: Diagnosis not present

## 2017-06-08 DIAGNOSIS — J449 Chronic obstructive pulmonary disease, unspecified: Secondary | ICD-10-CM | POA: Diagnosis not present

## 2017-08-09 ENCOUNTER — Other Ambulatory Visit: Payer: Self-pay | Admitting: Family Medicine

## 2017-08-09 DIAGNOSIS — Z1231 Encounter for screening mammogram for malignant neoplasm of breast: Secondary | ICD-10-CM

## 2017-08-21 DIAGNOSIS — I10 Essential (primary) hypertension: Secondary | ICD-10-CM | POA: Diagnosis not present

## 2017-08-21 DIAGNOSIS — J302 Other seasonal allergic rhinitis: Secondary | ICD-10-CM | POA: Diagnosis not present

## 2017-08-21 DIAGNOSIS — Z1211 Encounter for screening for malignant neoplasm of colon: Secondary | ICD-10-CM | POA: Diagnosis not present

## 2017-08-21 DIAGNOSIS — Z8719 Personal history of other diseases of the digestive system: Secondary | ICD-10-CM | POA: Diagnosis not present

## 2017-08-21 DIAGNOSIS — E782 Mixed hyperlipidemia: Secondary | ICD-10-CM | POA: Diagnosis not present

## 2017-08-21 DIAGNOSIS — J449 Chronic obstructive pulmonary disease, unspecified: Secondary | ICD-10-CM | POA: Diagnosis not present

## 2017-08-21 DIAGNOSIS — E279 Disorder of adrenal gland, unspecified: Secondary | ICD-10-CM | POA: Diagnosis not present

## 2017-08-23 DIAGNOSIS — E279 Disorder of adrenal gland, unspecified: Secondary | ICD-10-CM | POA: Diagnosis not present

## 2017-08-24 DIAGNOSIS — E279 Disorder of adrenal gland, unspecified: Secondary | ICD-10-CM | POA: Diagnosis not present

## 2017-08-30 DIAGNOSIS — E279 Disorder of adrenal gland, unspecified: Secondary | ICD-10-CM | POA: Diagnosis not present

## 2017-08-30 DIAGNOSIS — Z1211 Encounter for screening for malignant neoplasm of colon: Secondary | ICD-10-CM | POA: Diagnosis not present

## 2017-09-06 DIAGNOSIS — Z881 Allergy status to other antibiotic agents status: Secondary | ICD-10-CM | POA: Diagnosis not present

## 2017-09-06 DIAGNOSIS — E279 Disorder of adrenal gland, unspecified: Secondary | ICD-10-CM | POA: Diagnosis not present

## 2017-09-06 DIAGNOSIS — J449 Chronic obstructive pulmonary disease, unspecified: Secondary | ICD-10-CM | POA: Diagnosis not present

## 2017-09-06 DIAGNOSIS — Z882 Allergy status to sulfonamides status: Secondary | ICD-10-CM | POA: Diagnosis not present

## 2017-09-06 DIAGNOSIS — Z87891 Personal history of nicotine dependence: Secondary | ICD-10-CM | POA: Diagnosis not present

## 2017-09-06 DIAGNOSIS — A09 Infectious gastroenteritis and colitis, unspecified: Secondary | ICD-10-CM | POA: Diagnosis not present

## 2017-09-06 DIAGNOSIS — Z888 Allergy status to other drugs, medicaments and biological substances status: Secondary | ICD-10-CM | POA: Diagnosis not present

## 2017-09-06 DIAGNOSIS — I10 Essential (primary) hypertension: Secondary | ICD-10-CM | POA: Diagnosis not present

## 2017-09-06 DIAGNOSIS — K529 Noninfective gastroenteritis and colitis, unspecified: Secondary | ICD-10-CM | POA: Diagnosis not present

## 2017-09-13 ENCOUNTER — Ambulatory Visit (HOSPITAL_COMMUNITY): Payer: Medicare Other

## 2017-09-18 ENCOUNTER — Ambulatory Visit (INDEPENDENT_AMBULATORY_CARE_PROVIDER_SITE_OTHER): Payer: Medicare Other | Admitting: Family Medicine

## 2017-09-18 ENCOUNTER — Encounter: Payer: Self-pay | Admitting: Family Medicine

## 2017-09-18 ENCOUNTER — Other Ambulatory Visit: Payer: Self-pay

## 2017-09-18 VITALS — BP 182/90 | HR 102 | Temp 97.5°F | Resp 16 | Ht 62.0 in | Wt 117.8 lb

## 2017-09-18 DIAGNOSIS — E782 Mixed hyperlipidemia: Secondary | ICD-10-CM

## 2017-09-18 DIAGNOSIS — R634 Abnormal weight loss: Secondary | ICD-10-CM | POA: Diagnosis not present

## 2017-09-18 DIAGNOSIS — E278 Other specified disorders of adrenal gland: Secondary | ICD-10-CM

## 2017-09-18 DIAGNOSIS — I10 Essential (primary) hypertension: Secondary | ICD-10-CM | POA: Diagnosis not present

## 2017-09-18 DIAGNOSIS — Z1159 Encounter for screening for other viral diseases: Secondary | ICD-10-CM | POA: Diagnosis not present

## 2017-09-18 DIAGNOSIS — E279 Disorder of adrenal gland, unspecified: Secondary | ICD-10-CM | POA: Diagnosis not present

## 2017-09-18 DIAGNOSIS — R1012 Left upper quadrant pain: Secondary | ICD-10-CM | POA: Diagnosis not present

## 2017-09-18 MED ORDER — AMLODIPINE BESYLATE 10 MG PO TABS: 10.0000 mg | ORAL_TABLET | Freq: Every day | ORAL | 3 refills | Status: DC

## 2017-09-18 NOTE — Patient Instructions (Signed)
Schedule DEXA at check-out

## 2017-09-18 NOTE — Progress Notes (Signed)
Patient ID: Stacey Nash, female    DOB: Apr 04, 1951, 67 y.o.   MRN: [id number]  Chief Complaint  Patient presents with  . Hypertension  . Establish Care    Allergies Sulfa antibiotics; Doxycycline; Statins; and Zofran [name]  Subjective:   Stacey Nash is a 67 y.o. female who presents to [facility] today.  HPI Stacey Nash presents today as a new patient visit to establish care.  She is accompanied today by her daughter-in-law at this office visit.  She has been previously seen at [facility] family medicine and is wishing to transfer her care to our office.     She is a patient of Dr. Laural Golden and has an upcoming visit with him in May regarding her abdominal pain.  She reports that she has had left sided abdominal pain on and off for the past 2 months. She has been seen and evaluated at the emergency department in [place] for this pain.  She reports that she had a CT scan performed of her abdomen which showed evidence of colitis.  She reports that she was placed on metronidazole and it did help with her abdominal pain and diarrhea.  She reports that over the past couple months that she has indeed lost weight but the most the time her stomach feels swollen.  She reports that until approximately 2 months ago that her BM was qd.  She reports that they were usually formed and she had no difficulty with her bowel movements.  She reports that over the past couple months that she has had diarrhea.  She denies any melena, bright red blood per rectum, or hematochezia.  She reports that she has not seen any blood in her stools.  She does not have any records from the emergency department with her today.  She reports that she did get a call from her previous primary care doctor's office stating that she did have evidence of blood in her stool and that she needed to follow-up with gastroenterology.  She reports that yesterday she had 8 episodes of diarrhea.  She reports  that she is not in pain at this time and over the past months her pain has gotten a bit better but the diarrhea has persisted.  She reports that she has had low energy, feels more tired and weaker.  She correlates this to the bouts of diarrhea.  She also reports that when she has the diarrhea and feels bad that then her BP goes up.  She reports that she has had extremely high blood pressure for quite some time.  Reports that if she takes more than 7.5 mg of lisinopril that her blood pressure gets very low.  She does not know the actual readings but reports that she feels bad when her blood pressure is too high or too low.  She does not routinely check her blood pressure.  She reports that she was told recently that she had a mass on her adrenal glands.  She reports that she had lots of blood work done and urine testing but they told her everything was normal and she just needed yearly checks of this.  She denies any flushing.  She denies any current chest pain, shortness of breath, headaches, or vision changes.  She reports her appetite is pretty good.  Reports she has not been eating as much over the past couple months due to the diarrhea.  Patient reports that she is very hesitant to increase the  dose of lisinopril because she reports that has made her blood pressure get very low in the past.  Has also been on hydralazine in the past but is not taking that at this time.    Past Medical History:  Diagnosis Date  . Allergy or intolerance to drug    Doxycycline caused worsening shortness of breath and loose stools.  . Asthma   . Chronic neck pain   . COPD (chronic obstructive pulmonary disease) ([place])   . Diverticula of intestine   . Hypertension   . Left adrenal mass ([place])   . Osteoporosis   . Tobacco abuse     Past Surgical History:  Procedure Laterality Date  . APPENDECTOMY     2013  . CHOLECYSTECTOMY    . ESOPHAGOGASTRODUODENOSCOPY N/A 09/09/2014   Procedure: ESOPHAGOGASTRODUODENOSCOPY  (EGD);  Surgeon: Rogene Houston, MD;  [facility];  Service: Endoscopy;  Laterality: N/A;  210 - moved to 9:10 - Ann to notify pt  . EYE SURGERY     cataract/implants  . LAPAROSCOPIC APPENDECTOMY  07/14/2011   Procedure: APPENDECTOMY LAPAROSCOPIC;  Surgeon: Donato Heinz, MD;  Location: [facility] ORS;  Service: General;  Laterality: N/A;    Family History  Problem Relation Age of Onset  . Hypertension Mother   . Thyroid disease Mother   . Diabetes Father   . Alcoholism Father   . Crohn's disease Brother   . Hypertension Son      Social History   Socioeconomic History  . Marital status: Widowed    Spouse name: Not on file  . Number of children: Not on file  . Years of education: Not on file  . Highest education level: Not on file  Occupational History  . Not on file  Social Needs  . Financial resource strain: Not on file  . Food insecurity:    Worry: Not on file    Inability: Not on file  . Transportation needs:    Medical: Not on file    Non-medical: Not on file  Tobacco Use  . Smoking status: Former Smoker    Packs/day: 2.00    Years: 50.00    Pack years: 100.00    Types: Cigarettes    Last attempt to quit: 09/29/2013    Years since quitting: 3.9  . Smokeless tobacco: Former Systems developer    Quit date: 09/05/2013  Substance and Sexual Activity  . Alcohol use: No    Alcohol/week: 0.0 oz  . Drug use: No  . Sexual activity: Not Currently  Lifestyle  . Physical activity:    Days per week: Not on file    Minutes per session: Not on file  . Stress: Not on file  Relationships  . Social connections:    Talks on phone: Not on file    Gets together: Not on file    Attends religious service: Not on file    Active member of club or organization: Not on file    Attends meetings of clubs or organizations: Not on file    Relationship status: Not on file  Other Topics Concern  . Not on file  Social History Narrative   Retired from book keeping and taxes.   Single,  divorced.   3 boys.    Enjoys puzzles, reading, shopping.   Does not attend church, but watches tv church.   Does not eat red meat. Does not eat eggs or drink dairy.   Drives.   Wear seatbelt.  Youngest son lives with her.    Has grandchildren.    Current Outpatient Medications on File Prior to Visit  Medication Sig Dispense Refill  . albuterol (PROVENTIL HFA;VENTOLIN HFA) 108 (90 BASE) MCG/ACT inhaler Inhale into the lungs every 6 (six) hours as needed for wheezing or shortness of breath.    Marland Kitchen albuterol (PROVENTIL) (2.5 MG/3ML) 0.083% nebulizer solution Take 2.5 mg by nebulization every 6 (six) hours as needed for wheezing or shortness of breath.    . Aspirin-Acetaminophen-Caffeine (GOODY HEADACHE PO) Take 0.5-1 packets by mouth daily as needed (for pain).    . Calcium Carbonate-Vit D-Min (CALTRATE 600+D PLUS MINERALS) 600-800 MG-UNIT CHEW Chew 1 tablet by mouth daily.    . Cholecalciferol (VITAMIN D3) 1000 units CAPS Take 1 capsule by mouth daily.    . DOCOSAHEXAENOIC ACID PO Take by mouth.    . feeding supplement (BOOST HIGH PROTEIN) LIQD Take 1 Container by mouth daily as needed (for supplement).    Marland Kitchen loperamide (IMODIUM) 2 MG capsule Take 1 capsule (2 mg total) by mouth 4 (four) times daily as needed for diarrhea or loose stools. 12 capsule 0  . Menthol, Topical Analgesic, (BLUE-EMU MAXIMUM STRENGTH EX) Apply 1 application topically daily as needed (FOR PAIN).    Marland Kitchen tiotropium (SPIRIVA HANDIHALER) 18 MCG inhalation capsule Place 18 mcg into inhaler and inhale every evening.      No current facility-administered medications on file prior to visit.     Review of Systems  Constitutional: Positive for fatigue and unexpected weight change. Negative for activity change, appetite change, chills and fever.  HENT: Negative for mouth sores, trouble swallowing and voice change.   Eyes: Negative for visual disturbance.  Respiratory: Negative for cough, chest tightness, shortness of breath and  wheezing.   Cardiovascular: Negative for chest pain, palpitations and leg swelling.  Gastrointestinal: Positive for abdominal distention and diarrhea. Negative for abdominal pain, anal bleeding, constipation, nausea, rectal pain and vomiting.  Endocrine: Negative for polyphagia and polyuria.  Genitourinary: Negative for decreased urine volume, difficulty urinating, dysuria, flank pain, frequency, hematuria and urgency.  Musculoskeletal: Negative for back pain, neck pain and neck stiffness.  Skin: Negative for rash.  Neurological: Negative for dizziness, tremors, syncope, weakness, light-headedness, numbness and headaches.  Hematological: Negative for adenopathy.  Psychiatric/Behavioral: Negative for behavioral problems, hallucinations, sleep disturbance and suicidal ideas. The patient is not nervous/anxious and is not hyperactive.      Objective:   BP (!) 182/90 (BP Location: Left Arm, Patient Position: Sitting, Cuff Size: Normal)   Pulse (!) 102   Temp (!) 97.5 F (36.4 C) (Temporal)   Resp 16   Ht 5' 2"  (1.575 m)   Wt 117 lb 12 oz (53.4 kg)   SpO2 96%   BMI 21.54 kg/m    Physical Exam  Constitutional: She is oriented to person, place, and time. She appears well-developed and well-nourished. No distress.  Pleasant older female, appearance older than her stated age.  HENT:  Head: Normocephalic and atraumatic.  Nose: Nose normal.  Mouth/Throat: No oropharyngeal exudate.  Moist mucous membranes.  Eyes: Pupils are equal, round, and reactive to light. No scleral icterus.  Neck: Normal range of motion. Neck supple. No JVD present. No thyromegaly present.  Cardiovascular: Normal rate, regular rhythm and normal heart sounds.  Pulmonary/Chest: Effort normal and breath sounds normal. No respiratory distress. She has no wheezes.  Abdominal: Soft. Bowel sounds are normal. She exhibits no distension and no mass. There is no tenderness. There is  no rebound and no guarding.  No  hepatosplenomegaly noted on exam.  Musculoskeletal: Normal range of motion. She exhibits no edema.  Lymphadenopathy:    She has no cervical adenopathy.  Neurological: She is alert and oriented to person, place, and time. No cranial nerve deficit.  Skin: Skin is warm and dry. Capillary refill takes less than 2 seconds.  Psychiatric: She has a normal mood and affect. Her behavior is normal. Judgment and thought content normal.  Nursing note and vitals reviewed.    Assessment and Plan  1. Left upper quadrant pain Patient with chronic left upper quadrant pain with recent CT scan of the abdomen approximately 2 weeks ago.  Per report treated with metronidazole at that time and has had improvement.  She does not have an acute abdomen at this time and even with the diarrhea is maintaining her hydration status.  Will obtain CT scan of the abdomen at this time and records from emergency department at this time.  Will review.  We will also attempt to try to get patient in with Dr. Laural Golden sooner than Oct 05, 2017.  Patient was counseled concerning worrisome signs and symptoms of abdominal pain and if those occur she was asked to go to the emergency department.  She voiced understanding. - COMPLETE METABOLIC PANEL WITH GFR - CBC with Differential/Platelet - CBC with Differential/Platelet  2. Encounter for hepatitis C screening test for low risk patient - Hepatitis C antibody  3. Weight loss Suspect secondary to chronic diarrhea with loss of calories and decreased intake.  Check labs today.  Follow-up with GI - TSH  4. Mixed hyperlipidemia Check labs today.  Patient has definite cardiovascular risk factors. - Lipid panel  5. HTN, goal below 140/90 Blood pressure uncontrolled.  At this time will discontinue lisinopril and start amlodipine.  Check electrolytes today. - amLODipine (NORVASC) 10 MG tablet; Take 1 tablet (10 mg total) by mouth daily.  Dispense: 90 tablet; Refill: 3 - US Renal Artery  Stenosis; Future Patient was severely elevated blood pressure check renal arteries today.  Lifestyle modifications discussed with patient including a diet emphasizing vegetables, fruits, and whole grains. Limiting intake of sodium to less than 2,400 mg per day.  Recommendations discussed include consuming low-fat dairy products, poultry, fish, legumes, non-tropical vegetable oils, and nuts; and limiting intake of sweets, sugar-sweetened beverages, and red meat. Discussed following a plan such as the Dietary Approaches to Stop Hypertension (DASH) diet. Patient to read up on this diet.   6.  Left adrenal mass This diagnosis was discussed with patient today. Discussed with patient that CT scan dated back to 2013 revealed evidence of adrenal lesion.  We also discussed that the CT scan performed in June 2018 showed evidence of the adrenal lesion.  Patient reports that she was not aware of this and thought that this was a new diagnosis.  She reports she has had urinary studies done and additional scans.  We will request those at this time.  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 Return in about 2 weeks (around 10/02/2017) for follow up. Caren Macadam, MD 09/18/2017

## 2017-09-19 NOTE — Progress Notes (Signed)
Patient was not scheduled with Dr. Laural Golden, they could not get her I with him. She was scheduled with Terri. They were able to schedule her sooner with Terri for eval. Records have been requested.

## 2017-09-19 NOTE — Progress Notes (Signed)
Gwinda Passe already completed this request

## 2017-09-24 ENCOUNTER — Encounter (INDEPENDENT_AMBULATORY_CARE_PROVIDER_SITE_OTHER): Payer: Self-pay | Admitting: Internal Medicine

## 2017-09-24 ENCOUNTER — Ambulatory Visit (INDEPENDENT_AMBULATORY_CARE_PROVIDER_SITE_OTHER): Payer: Medicare Other | Admitting: Internal Medicine

## 2017-09-24 VITALS — BP 180/100 | HR 100 | Temp 97.4°F | Ht 62.0 in | Wt 115.2 lb

## 2017-09-24 DIAGNOSIS — R195 Other fecal abnormalities: Secondary | ICD-10-CM | POA: Diagnosis not present

## 2017-09-24 DIAGNOSIS — R634 Abnormal weight loss: Secondary | ICD-10-CM | POA: Diagnosis not present

## 2017-09-24 DIAGNOSIS — E782 Mixed hyperlipidemia: Secondary | ICD-10-CM | POA: Diagnosis not present

## 2017-09-24 DIAGNOSIS — R197 Diarrhea, unspecified: Secondary | ICD-10-CM | POA: Diagnosis not present

## 2017-09-24 DIAGNOSIS — Z1159 Encounter for screening for other viral diseases: Secondary | ICD-10-CM | POA: Diagnosis not present

## 2017-09-24 DIAGNOSIS — R1012 Left upper quadrant pain: Secondary | ICD-10-CM | POA: Diagnosis not present

## 2017-09-24 LAB — CBC WITH DIFFERENTIAL/PLATELET
BASOS PCT: 0.9 %
Basophils Absolute: 62 cells/uL (ref 0–200)
Eosinophils Absolute: 48 cells/uL (ref 15–500)
Eosinophils Relative: 0.7 %
HEMATOCRIT: 42 % (ref 35.0–45.0)
Hemoglobin: 14.6 g/dL (ref 11.7–15.5)
LYMPHS ABS: 1504 {cells}/uL (ref 850–3900)
MCH: 28.7 pg (ref 27.0–33.0)
MCHC: 34.8 g/dL (ref 32.0–36.0)
MCV: 82.5 fL (ref 80.0–100.0)
MPV: 9 fL (ref 7.5–12.5)
Monocytes Relative: 6.5 %
Neutro Abs: 4837 cells/uL (ref 1500–7800)
Neutrophils Relative %: 70.1 %
Platelets: 429 10*3/uL — ABNORMAL HIGH (ref 140–400)
RBC: 5.09 10*6/uL (ref 3.80–5.10)
RDW: 12.9 % (ref 11.0–15.0)
Total Lymphocyte: 21.8 %
WBC: 6.9 10*3/uL (ref 3.8–10.8)
WBCMIX: 449 {cells}/uL (ref 200–950)

## 2017-09-24 LAB — COMPLETE METABOLIC PANEL WITH GFR
AG Ratio: 2 (calc) (ref 1.0–2.5)
ALBUMIN MSPROF: 4.9 g/dL (ref 3.6–5.1)
ALKALINE PHOSPHATASE (APISO): 79 U/L (ref 33–130)
ALT: 11 U/L (ref 6–29)
AST: 16 U/L (ref 10–35)
BUN: 9 mg/dL (ref 7–25)
CALCIUM: 10 mg/dL (ref 8.6–10.4)
CO2: 26 mmol/L (ref 20–32)
CREATININE: 0.69 mg/dL (ref 0.50–0.99)
Chloride: 101 mmol/L (ref 98–110)
GFR, EST NON AFRICAN AMERICAN: 89 mL/min/{1.73_m2} (ref >=60)
GFR, Est African American: 104 mL/min/{1.73_m2} (ref >=60)
GLUCOSE: 108 mg/dL — AB (ref 65–99)
Globulin: 2.5 g/dL (calc) (ref 1.9–3.7)
Potassium: 4 mmol/L (ref 3.5–5.3)
Sodium: 137 mmol/L (ref 135–146)
TOTAL PROTEIN: 7.4 g/dL (ref 6.1–8.1)
Total Bilirubin: 1.4 mg/dL — ABNORMAL HIGH (ref 0.2–1.2)

## 2017-09-24 LAB — LIPID PANEL
CHOL/HDL RATIO: 6.2 (calc) — AB (ref <5.0)
Cholesterol: 279 mg/dL — ABNORMAL HIGH (ref <200)
HDL: 45 mg/dL — AB (ref >50)
LDL Cholesterol (Calc): 192 mg/dL (calc) — ABNORMAL HIGH
NON-HDL CHOLESTEROL (CALC): 234 mg/dL — AB (ref <130)
TRIGLYCERIDES: 229 mg/dL — AB (ref <150)

## 2017-09-24 LAB — TSH: TSH: 0.36 mIU/L — ABNORMAL LOW (ref 0.40–4.50)

## 2017-09-24 NOTE — Progress Notes (Signed)
Subjective:    Patient ID: Stacey Nash, female    DOB: 12-18-1950, 67 y.o.   MRN: [id number]  HPI Here today for f/u.  She tells me she has been having some left lower abdominal pain. She states she has had the pain off and on for the past month or so. She has pressure in her abdomen. Seen in the ED at [facility]  4/11/2019and told she was told she had gastritis/colitis. There was no fever. She says she was treated with Flagyl. Acute symptoms last for about a week.  She tells me over the weekend her stools were better. She did have 2 loose stools this am. On average she is having 2-3 stools a day.  She is 50% better. She says she was seen at [facility] and had a positive stool card just before she became sick.  She has never undergone a colonoscopy in the past.  She takes Imodium as needed which helps.       Review of Systems Past Medical History:  Diagnosis Date  . Allergy or intolerance to drug    Doxycycline caused worsening shortness of breath and loose stools.  . Asthma   . Chronic neck pain   . COPD (chronic obstructive pulmonary disease) ([place])   . Diverticula of intestine   . Hypertension   . Left adrenal mass ([place])   . Osteoporosis   . Tobacco abuse     Past Surgical History:  Procedure Laterality Date  . APPENDECTOMY     2013  . CHOLECYSTECTOMY    . ESOPHAGOGASTRODUODENOSCOPY N/A 09/09/2014   Procedure: ESOPHAGOGASTRODUODENOSCOPY (EGD);  Surgeon: Rogene Houston, MD;  Location: [facility] ENDO SUITE;  Service: Endoscopy;  Laterality: N/A;  210 - moved to 9:10 - Ann to notify pt  . EYE SURGERY     cataract/implants  . LAPAROSCOPIC APPENDECTOMY  07/14/2011   Procedure: APPENDECTOMY LAPAROSCOPIC;  Surgeon: Donato Heinz, MD;  Location: [facility] ORS;  Service: General;  Laterality: N/A;    Allergies  Allergen Reactions  . Sulfa Antibiotics Shortness Of Breath  . Doxycycline Diarrhea  . Statins   . Zofran [Ondansetron Hcl] Hives and Rash    Rash at injection  site that spread immediately following administration      Current Outpatient Medications on File Prior to Visit  Medication Sig Dispense Refill  . albuterol (PROVENTIL HFA;VENTOLIN HFA) 108 (90 BASE) MCG/ACT inhaler Inhale into the lungs every 6 (six) hours as needed for wheezing or shortness of breath.    Marland Kitchen albuterol (PROVENTIL) (2.5 MG/3ML) 0.083% nebulizer solution Take 2.5 mg by nebulization every 6 (six) hours as needed for wheezing or shortness of breath.    Marland Kitchen amLODipine (NORVASC) 10 MG tablet Take 1 tablet (10 mg total) by mouth daily. (Patient taking differently: Take 5 mg by mouth daily. ) 90 tablet 3  . Calcium Carbonate-Vit D-Min (CALTRATE 600+D PLUS MINERALS) 600-800 MG-UNIT CHEW Chew 1 tablet by mouth daily.    . feeding supplement (BOOST HIGH PROTEIN) LIQD Take 1 Container by mouth daily as needed (for supplement).    Marland Kitchen loperamide (IMODIUM) 2 MG capsule Take 1 capsule (2 mg total) by mouth 4 (four) times daily as needed for diarrhea or loose stools. 12 capsule 0  . Menthol, Topical Analgesic, (BLUE-EMU MAXIMUM STRENGTH EX) Apply 1 application topically daily as needed (FOR PAIN).    Marland Kitchen tiotropium (SPIRIVA HANDIHALER) 18 MCG inhalation capsule Place 18 mcg into inhaler and inhale every evening.     Marland Kitchen  Aspirin-Acetaminophen-Caffeine (GOODY HEADACHE PO) Take 0.5-1 packets by mouth daily as needed (for pain).    . Omega-3 Fatty Acids (FISH OIL) 1000 MG CAPS Take 2,000 mg by mouth.     No current facility-administered medications on file prior to visit.         Objective:   Physical Exam Blood pressure (!) 180/100, pulse 100, temperature (!) 97.4 F (36.3 C), height 5' 2"  (1.575 m), weight 115 lb 3.2 oz (52.3 kg). Alert and oriented. Skin warm and dry. Oral mucosa is moist.   . Sclera anicteric, conjunctivae is pink. Thyroid not enlarged. No cervical lymphadenopathy. Lungs clear. Heart regular rate and rhythm.  Abdomen is soft. Bowel sounds are positive. No hepatomegaly. No abdominal  masses felt. No tenderness.  No edema to lower extremities.   Stool brown and guaiac negative.        Assessment & Plan:  Change in stool. Am going to get records from [facility]. + stool card. She needs to undergo a colonoscopy in the near future. The risks of bleeding, perforation and infection were reviewed with patient.

## 2017-09-24 NOTE — Patient Instructions (Signed)
The risks of bleeding, perforation and infection were reviewed with patient.  

## 2017-09-25 ENCOUNTER — Other Ambulatory Visit (INDEPENDENT_AMBULATORY_CARE_PROVIDER_SITE_OTHER): Payer: Self-pay | Admitting: Internal Medicine

## 2017-09-25 ENCOUNTER — Encounter (INDEPENDENT_AMBULATORY_CARE_PROVIDER_SITE_OTHER): Payer: Self-pay | Admitting: *Deleted

## 2017-09-25 ENCOUNTER — Encounter: Payer: Self-pay | Admitting: Family Medicine

## 2017-09-25 ENCOUNTER — Telehealth (INDEPENDENT_AMBULATORY_CARE_PROVIDER_SITE_OTHER): Payer: Self-pay | Admitting: *Deleted

## 2017-09-25 DIAGNOSIS — R195 Other fecal abnormalities: Secondary | ICD-10-CM | POA: Insufficient documentation

## 2017-09-25 DIAGNOSIS — R197 Diarrhea, unspecified: Secondary | ICD-10-CM | POA: Insufficient documentation

## 2017-09-25 LAB — HEPATITIS C ANTIBODY
Hepatitis C Ab: NONREACTIVE
SIGNAL TO CUT-OFF: 0.01 (ref <1.00)

## 2017-09-25 MED ORDER — PEG 3350-KCL-NA BICARB-NACL 420 G PO SOLR: 4000.0000 mL | Freq: Once | ORAL | 0 refills | Status: AC

## 2017-09-25 NOTE — Telephone Encounter (Signed)
Patient needs trilyte 

## 2017-09-26 ENCOUNTER — Ambulatory Visit (INDEPENDENT_AMBULATORY_CARE_PROVIDER_SITE_OTHER): Payer: Medicare Other | Admitting: Internal Medicine

## 2017-09-26 ENCOUNTER — Telehealth: Payer: Self-pay | Admitting: Family Medicine

## 2017-09-26 DIAGNOSIS — R739 Hyperglycemia, unspecified: Secondary | ICD-10-CM

## 2017-09-26 DIAGNOSIS — E0590 Thyrotoxicosis, unspecified without thyrotoxic crisis or storm: Secondary | ICD-10-CM

## 2017-09-26 NOTE — Telephone Encounter (Signed)
Please call and advised to keep scheduled follow-up.  Advised that her thyroid function overactive which could be contributing to weight loss and diarrhea.  At this time I am placing referral for her to get a thyroid ultrasound.  She also needs to go ahead and come back to the lab for some additional more detailed thyroid testing.  I am also putting a referral in for her to be seen by the endocrinologist for evaluation of this thyroid issue.  Please advised her that her cholesterol was extremely elevated.  Her chart states that she is allergic to statins.  Please advise her that we will need to discuss this at her follow-up because her cholesterol is extremely elevated.  Advised that her hepatitis C testing was negative.  Also advised her that her blood sugar is elevated and I have ordered a hemoglobin A1c to check her for diabetes.  Advised to get these labs done prior to her follow-up office visit.

## 2017-09-26 NOTE — Telephone Encounter (Signed)
Patient informed of message below, verbalized understanding.  

## 2017-09-26 NOTE — Patient Instructions (Addendum)
Stacey Nash  09/26/2017     @PREFPERIOPPHARMACY @   Your procedure is scheduled on  10/01/2017 .  Report to [facility] at  6:55   A.M.  Call this number if you have problems the morning of surgery:  [phone]   Remember:  Do not eat food or drink liquids after midnight.  Take these medicines the morning of surgery with A SIP OF WATER  Amlodipine. Use your nebulizer and your inhalers before you come. Bring your rescue inhaler with you.   Do not wear jewelry, make-up or nail polish.  Do not wear lotions, powders, or perfumes, or deodorant.  Do not shave 48 hours prior to surgery.  Men may shave face and neck.  Do not bring valuables to the hospital.  [facility] is not responsible for any belongings or valuables.  Contacts, dentures or bridgework may not be worn into surgery.  Leave your suitcase in the car.  After surgery it may be brought to your room.  For patients admitted to the hospital, discharge time will be determined by your treatment team.  Patients discharged the day of surgery will not be allowed to drive home.   Name and phone number of your driver:   family Special instructions:  Follow the diet and prep instructions given to you by Dr Olevia Perches office.  Please read over the following fact sheets that you were given. Anesthesia Post-op Instructions and Care and Recovery After Surgery       Colonoscopy, Adult A colonoscopy is an exam to look at the large intestine. It is done to check for problems, such as:  Lumps (tumors).  Growths (polyps).  Swelling (inflammation).  Bleeding.  What happens before the procedure? Eating and drinking Follow instructions from your doctor about eating and drinking. These instructions may include:  A few days before the procedure - follow a low-fiber diet. ? Avoid nuts. ? Avoid seeds. ? Avoid dried fruit. ? Avoid raw fruits. ? Avoid vegetables.  1-3 days before the procedure - follow a clear  liquid diet. Avoid liquids that have red or purple dye. Drink only clear liquids, such as: ? Clear broth or bouillon. ? Black coffee or tea. ? Clear juice. ? Clear soft drinks or sports drinks. ? Gelatin dessert. ? Popsicles.  On the day of the procedure - do not eat or drink anything during the 2 hours before the procedure.  Bowel prep If you were prescribed an oral bowel prep:  Take it as told by your doctor. Starting the day before your procedure, you will need to drink a lot of liquid. The liquid will cause you to poop (have bowel movements) until your poop is almost clear or light green.  If your skin or butt gets irritated from diarrhea, you may: ? Wipe the area with wipes that have medicine in them, such as adult wet wipes with aloe and vitamin E. ? Put something on your skin that soothes the area, such as petroleum jelly.  If you throw up (vomit) while drinking the bowel prep, take a break for up to 60 minutes. Then begin the bowel prep again. If you keep throwing up and you cannot take the bowel prep without throwing up, call your doctor.  General instructions  Ask your doctor about changing or stopping your normal medicines. This is important if you take diabetes medicines or blood thinners.  Plan to have someone take you home  from the hospital or clinic. What happens during the procedure?  An IV tube may be put into one of your veins.  You will be given medicine to help you relax (sedative).  To reduce your risk of infection: ? Your doctors will wash their hands. ? Your anal area will be washed with soap.  You will be asked to lie on your side with your knees bent.  Your doctor will get a long, thin, flexible tube ready. The tube will have a camera and a light on the end.  The tube will be put into your anus.  The tube will be gently put into your large intestine.  Air will be delivered into your large intestine to keep it open. You may feel some pressure or  cramping.  The camera will be used to take photos.  A small tissue sample may be removed from your body to be looked at under a microscope (biopsy). If any possible problems are found, the tissue will be sent to a lab for testing.  If small growths are found, your doctor may remove them and have them checked for cancer.  The tube that was put into your anus will be slowly removed. The procedure may vary among doctors and hospitals. What happens after the procedure?  Your doctor will check on you often until the medicines you were given have worn off.  Do not drive for 24 hours after the procedure.  You may have a small amount of blood in your poop.  You may pass gas.  You may have mild cramps or bloating in your belly (abdomen).  It is up to you to get the results of your procedure. Ask your doctor, or the department performing the procedure, when your results will be ready. This information is not intended to replace advice given to you by your health care provider. Make sure you discuss any questions you have with your health care provider. Document Released: 06/17/2010 Document Revised: 03/15/2016 Document Reviewed: 07/27/2015 Elsevier Interactive Patient Education  2017 Elsevier Inc.  Colonoscopy, Adult, Care After This sheet gives you information about how to care for yourself after your procedure. Your health care provider may also give you more specific instructions. If you have problems or questions, contact your health care provider. What can I expect after the procedure? After the procedure, it is common to have:  A small amount of blood in your stool for 24 hours after the procedure.  Some gas.  Mild abdominal cramping or bloating.  Follow these instructions at home: General instructions   For the first 24 hours after the procedure: ? Do not drive or use machinery. ? Do not sign important documents. ? Do not drink alcohol. ? Do your regular daily activities  at a slower pace than normal. ? Eat soft, easy-to-digest foods. ? Rest often.  Take over-the-counter or prescription medicines only as told by your health care provider.  It is up to you to get the results of your procedure. Ask your health care provider, or the department performing the procedure, when your results will be ready. Relieving cramping and bloating  Try walking around when you have cramps or feel bloated.  Apply heat to your abdomen as told by your health care provider. Use a heat source that your health care provider recommends, such as a moist heat pack or a heating pad. ? Place a towel between your skin and the heat source. ? Leave the heat on for 20-30 minutes. ?  Remove the heat if your skin turns bright red. This is especially important if you are unable to feel pain, heat, or cold. You may have a greater risk of getting burned. Eating and drinking  Drink enough fluid to keep your urine clear or pale yellow.  Resume your normal diet as instructed by your health care provider. Avoid heavy or fried foods that are hard to digest.  Avoid drinking alcohol for as long as instructed by your health care provider. Contact a health care provider if:  You have blood in your stool 2-3 days after the procedure. Get help right away if:  You have more than a small spotting of blood in your stool.  You pass large blood clots in your stool.  Your abdomen is swollen.  You have nausea or vomiting.  You have a fever.  You have increasing abdominal pain that is not relieved with medicine. This information is not intended to replace advice given to you by your health care provider. Make sure you discuss any questions you have with your health care provider. Document Released: 12/28/2003 Document Revised: 02/07/2016 Document Reviewed: 07/27/2015 Elsevier Interactive Patient Education  2018 [place] Anesthesia is a term that refers to techniques,  procedures, and medicines that help a person stay safe and comfortable during a medical procedure. Monitored anesthesia care, or sedation, is one type of anesthesia. Your anesthesia specialist may recommend sedation if you will be having a procedure that does not require you to be unconscious, such as:  Cataract surgery.  A dental procedure.  A biopsy.  A colonoscopy.  During the procedure, you may receive a medicine to help you relax (sedative). There are three levels of sedation:  Mild sedation. At this level, you may feel awake and relaxed. You will be able to follow directions.  Moderate sedation. At this level, you will be sleepy. You may not remember the procedure.  Deep sedation. At this level, you will be asleep. You will not remember the procedure.  The more medicine you are given, the deeper your level of sedation will be. Depending on how you respond to the procedure, the anesthesia specialist may change your level of sedation or the type of anesthesia to fit your needs. An anesthesia specialist will monitor you closely during the procedure. Let your health care provider know about:  Any allergies you have.  All medicines you are taking, including vitamins, herbs, eye drops, creams, and over-the-counter medicines.  Any use of steroids (by mouth or as a cream).  Any problems you or family members have had with sedatives and anesthetic medicines.  Any blood disorders you have.  Any surgeries you have had.  Any medical conditions you have, such as sleep apnea.  Whether you are pregnant or may be pregnant.  Any use of cigarettes, alcohol, or street drugs. What are the risks? Generally, this is a safe procedure. However, problems may occur, including:  Getting too much medicine (oversedation).  Nausea.  Allergic reaction to medicines.  Trouble breathing. If this happens, a breathing tube may be used to help with breathing. It will be removed when you are awake and  breathing on your own.  Heart trouble.  Lung trouble.  Before the procedure Staying hydrated Follow instructions from your health care provider about hydration, which may include:  Up to 2 hours before the procedure - you may continue to drink clear liquids, such as water, clear fruit juice, black coffee, and plain tea.  Eating and drinking restrictions Follow instructions from your health care provider about eating and drinking, which may include:  8 hours before the procedure - stop eating heavy meals or foods such as meat, fried foods, or fatty foods.  6 hours before the procedure - stop eating light meals or foods, such as toast or cereal.  6 hours before the procedure - stop drinking milk or drinks that contain milk.  2 hours before the procedure - stop drinking clear liquids.  Medicines Ask your health care provider about:  Changing or stopping your regular medicines. This is especially important if you are taking diabetes medicines or blood thinners.  Taking medicines such as aspirin and ibuprofen. These medicines can thin your blood. Do not take these medicines before your procedure if your health care provider instructs you not to.  Tests and exams  You will have a physical exam.  You may have blood tests done to show: ? How well your kidneys and liver are working. ? How well your blood can clot.  General instructions  Plan to have someone take you home from the hospital or clinic.  If you will be going home right after the procedure, plan to have someone with you for 24 hours.  What happens during the procedure?  Your blood pressure, heart rate, breathing, level of pain and overall condition will be monitored.  An IV tube will be inserted into one of your veins.  Your anesthesia specialist will give you medicines as needed to keep you comfortable during the procedure. This may mean changing the level of sedation.  The procedure will be performed. After  the procedure  Your blood pressure, heart rate, breathing rate, and blood oxygen level will be monitored until the medicines you were given have worn off.  Do not drive for 24 hours if you received a sedative.  You may: ? Feel sleepy, clumsy, or nauseous. ? Feel forgetful about what happened after the procedure. ? Have a sore throat if you had a breathing tube during the procedure. ? Vomit. This information is not intended to replace advice given to you by your health care provider. Make sure you discuss any questions you have with your health care provider. Document Released: 02/08/2005 Document Revised: 10/22/2015 Document Reviewed: 09/05/2015 Elsevier Interactive Patient Education  2018 [place], Care After These instructions provide you with information about caring for yourself after your procedure. Your health care provider may also give you more specific instructions. Your treatment has been planned according to current medical practices, but problems sometimes occur. Call your health care provider if you have any problems or questions after your procedure. What can I expect after the procedure? After your procedure, it is common to:  Feel sleepy for several hours.  Feel clumsy and have poor balance for several hours.  Feel forgetful about what happened after the procedure.  Have poor judgment for several hours.  Feel nauseous or vomit.  Have a sore throat if you had a breathing tube during the procedure.  Follow these instructions at home: For at least 24 hours after the procedure:   Do not: ? Participate in activities in which you could fall or become injured. ? Drive. ? Use heavy machinery. ? Drink alcohol. ? Take sleeping pills or medicines that cause drowsiness. ? Make important decisions or sign legal documents. ? Take care of children on your own.  Rest. Eating and drinking  Follow the diet that is recommended by your  health  care provider.  If you vomit, drink water, juice, or soup when you can drink without vomiting.  Make sure you have little or no nausea before eating solid foods. General instructions  Have a responsible adult stay with you until you are awake and alert.  Take over-the-counter and prescription medicines only as told by your health care provider.  If you smoke, do not smoke without supervision.  Keep all follow-up visits as told by your health care provider. This is important. Contact a health care provider if:  You keep feeling nauseous or you keep vomiting.  You feel light-headed.  You develop a rash.  You have a fever. Get help right away if:  You have trouble breathing. This information is not intended to replace advice given to you by your health care provider. Make sure you discuss any questions you have with your health care provider. Document Released: 09/05/2015 Document Revised: 01/05/2016 Document Reviewed: 09/05/2015 Elsevier Interactive Patient Education  [employer].

## 2017-09-27 ENCOUNTER — Other Ambulatory Visit (HOSPITAL_COMMUNITY): Payer: Medicare Other

## 2017-09-28 ENCOUNTER — Other Ambulatory Visit: Payer: Self-pay

## 2017-09-28 ENCOUNTER — Encounter (HOSPITAL_COMMUNITY)
Admission: RE | Admit: 2017-09-28 | Discharge: 2017-09-28 | Disposition: A | Discharge status: Home / Self Care | Payer: Medicare Other | Source: Ambulatory Visit | Attending: Internal Medicine | Admitting: Internal Medicine

## 2017-09-28 ENCOUNTER — Encounter (HOSPITAL_COMMUNITY): Payer: Self-pay

## 2017-09-28 DIAGNOSIS — Z8379 Family history of other diseases of the digestive system: Secondary | ICD-10-CM | POA: Diagnosis not present

## 2017-09-28 DIAGNOSIS — R197 Diarrhea, unspecified: Secondary | ICD-10-CM | POA: Diagnosis present

## 2017-09-28 DIAGNOSIS — Z882 Allergy status to sulfonamides status: Secondary | ICD-10-CM | POA: Diagnosis not present

## 2017-09-28 DIAGNOSIS — Z888 Allergy status to other drugs, medicaments and biological substances status: Secondary | ICD-10-CM | POA: Diagnosis not present

## 2017-09-28 DIAGNOSIS — M810 Age-related osteoporosis without current pathological fracture: Secondary | ICD-10-CM | POA: Diagnosis not present

## 2017-09-28 DIAGNOSIS — D128 Benign neoplasm of rectum: Secondary | ICD-10-CM | POA: Diagnosis not present

## 2017-09-28 DIAGNOSIS — Z79899 Other long term (current) drug therapy: Secondary | ICD-10-CM | POA: Diagnosis not present

## 2017-09-28 DIAGNOSIS — R195 Other fecal abnormalities: Secondary | ICD-10-CM

## 2017-09-28 DIAGNOSIS — K5730 Diverticulosis of large intestine without perforation or abscess without bleeding: Secondary | ICD-10-CM | POA: Diagnosis not present

## 2017-09-28 DIAGNOSIS — Z9981 Dependence on supplemental oxygen: Secondary | ICD-10-CM | POA: Diagnosis not present

## 2017-09-28 DIAGNOSIS — J449 Chronic obstructive pulmonary disease, unspecified: Secondary | ICD-10-CM | POA: Diagnosis not present

## 2017-09-28 DIAGNOSIS — I10 Essential (primary) hypertension: Secondary | ICD-10-CM | POA: Diagnosis not present

## 2017-09-28 DIAGNOSIS — Z881 Allergy status to other antibiotic agents status: Secondary | ICD-10-CM | POA: Diagnosis not present

## 2017-09-28 DIAGNOSIS — K529 Noninfective gastroenteritis and colitis, unspecified: Secondary | ICD-10-CM | POA: Diagnosis not present

## 2017-09-28 DIAGNOSIS — Z87891 Personal history of nicotine dependence: Secondary | ICD-10-CM | POA: Diagnosis not present

## 2017-10-01 ENCOUNTER — Encounter: Payer: Self-pay | Admitting: Family Medicine

## 2017-10-01 ENCOUNTER — Telehealth: Payer: Self-pay

## 2017-10-01 ENCOUNTER — Ambulatory Visit (HOSPITAL_COMMUNITY): Payer: Medicare Other | Admitting: Anesthesiology

## 2017-10-01 ENCOUNTER — Encounter (HOSPITAL_COMMUNITY)
Admission: RE | Disposition: A | Discharge status: Home / Self Care | Payer: Self-pay | Source: Ambulatory Visit | Attending: Internal Medicine

## 2017-10-01 ENCOUNTER — Other Ambulatory Visit: Payer: Self-pay

## 2017-10-01 ENCOUNTER — Ambulatory Visit (HOSPITAL_COMMUNITY)
Admission: RE | Admit: 2017-10-01 | Discharge: 2017-10-01 | Disposition: A | Discharge status: Home / Self Care | Payer: Medicare Other | Source: Ambulatory Visit | Attending: Internal Medicine | Admitting: Internal Medicine

## 2017-10-01 ENCOUNTER — Encounter (HOSPITAL_COMMUNITY): Payer: Self-pay | Admitting: Emergency Medicine

## 2017-10-01 DIAGNOSIS — J449 Chronic obstructive pulmonary disease, unspecified: Secondary | ICD-10-CM | POA: Diagnosis not present

## 2017-10-01 DIAGNOSIS — K625 Hemorrhage of anus and rectum: Secondary | ICD-10-CM | POA: Diagnosis not present

## 2017-10-01 DIAGNOSIS — Z79899 Other long term (current) drug therapy: Secondary | ICD-10-CM | POA: Diagnosis not present

## 2017-10-01 DIAGNOSIS — Z888 Allergy status to other drugs, medicaments and biological substances status: Secondary | ICD-10-CM | POA: Insufficient documentation

## 2017-10-01 DIAGNOSIS — D128 Benign neoplasm of rectum: Secondary | ICD-10-CM | POA: Diagnosis not present

## 2017-10-01 DIAGNOSIS — K529 Noninfective gastroenteritis and colitis, unspecified: Secondary | ICD-10-CM | POA: Diagnosis not present

## 2017-10-01 DIAGNOSIS — R195 Other fecal abnormalities: Secondary | ICD-10-CM

## 2017-10-01 DIAGNOSIS — Z882 Allergy status to sulfonamides status: Secondary | ICD-10-CM | POA: Insufficient documentation

## 2017-10-01 DIAGNOSIS — K5730 Diverticulosis of large intestine without perforation or abscess without bleeding: Secondary | ICD-10-CM | POA: Insufficient documentation

## 2017-10-01 DIAGNOSIS — I10 Essential (primary) hypertension: Secondary | ICD-10-CM | POA: Diagnosis not present

## 2017-10-01 DIAGNOSIS — K6389 Other specified diseases of intestine: Secondary | ICD-10-CM | POA: Diagnosis not present

## 2017-10-01 DIAGNOSIS — Z8379 Family history of other diseases of the digestive system: Secondary | ICD-10-CM | POA: Insufficient documentation

## 2017-10-01 DIAGNOSIS — Z87891 Personal history of nicotine dependence: Secondary | ICD-10-CM | POA: Diagnosis not present

## 2017-10-01 DIAGNOSIS — M810 Age-related osteoporosis without current pathological fracture: Secondary | ICD-10-CM | POA: Diagnosis not present

## 2017-10-01 DIAGNOSIS — Z9981 Dependence on supplemental oxygen: Secondary | ICD-10-CM | POA: Insufficient documentation

## 2017-10-01 DIAGNOSIS — R197 Diarrhea, unspecified: Secondary | ICD-10-CM | POA: Insufficient documentation

## 2017-10-01 DIAGNOSIS — K621 Rectal polyp: Secondary | ICD-10-CM | POA: Diagnosis not present

## 2017-10-01 DIAGNOSIS — Z881 Allergy status to other antibiotic agents status: Secondary | ICD-10-CM | POA: Insufficient documentation

## 2017-10-01 HISTORY — PX: BIOPSY: SHX5522

## 2017-10-01 HISTORY — PX: POLYPECTOMY: SHX5525

## 2017-10-01 HISTORY — PX: COLONOSCOPY WITH PROPOFOL: SHX5780

## 2017-10-01 LAB — GASTROINTESTINAL PANEL BY PCR, STOOL (REPLACES STOOL CULTURE)
ADENOVIRUS F40/41: NOT DETECTED
ASTROVIRUS: NOT DETECTED
Campylobacter species: NOT DETECTED
Cryptosporidium: NOT DETECTED
Cyclospora cayetanensis: NOT DETECTED
ENTAMOEBA HISTOLYTICA: NOT DETECTED
ENTEROAGGREGATIVE E COLI (EAEC): NOT DETECTED
ENTEROTOXIGENIC E COLI (ETEC): NOT DETECTED
Enteropathogenic E coli (EPEC): NOT DETECTED
GIARDIA LAMBLIA: NOT DETECTED
Norovirus GI/GII: NOT DETECTED
Plesimonas shigelloides: NOT DETECTED
Rotavirus A: NOT DETECTED
Salmonella species: NOT DETECTED
Sapovirus (I, II, IV, and V): NOT DETECTED
Shiga like toxin producing E coli (STEC): NOT DETECTED
Shigella/Enteroinvasive E coli (EIEC): NOT DETECTED
VIBRIO CHOLERAE: NOT DETECTED
Vibrio species: NOT DETECTED
Yersinia enterocolitica: NOT DETECTED

## 2017-10-01 SURGERY — COLONOSCOPY WITH PROPOFOL
Anesthesia: General

## 2017-10-01 MED ORDER — PROPOFOL 500 MG/50ML IV EMUL
INTRAVENOUS | Status: DC | PRN
  Administered 2017-10-01 (×2): 125 ug/kg/min via INTRAVENOUS

## 2017-10-01 MED ORDER — CHLORHEXIDINE GLUCONATE CLOTH 2 % EX PADS: 6.0000 | MEDICATED_PAD | Freq: Once | CUTANEOUS | Status: DC

## 2017-10-01 MED ORDER — PROPOFOL 10 MG/ML IV BOLUS
INTRAVENOUS | Status: DC | PRN
Start: 2017-10-01 — End: 2017-10-01
  Administered 2017-10-01 (×6): 20 mg via INTRAVENOUS

## 2017-10-01 MED ORDER — PANTOPRAZOLE SODIUM 40 MG PO TBEC: 40.0000 mg | DELAYED_RELEASE_TABLET | Freq: Every day | ORAL | 5 refills | Status: DC

## 2017-10-01 MED ORDER — LACTATED RINGERS IV SOLN
INTRAVENOUS | Status: DC
  Administered 2017-10-01: 09:00:00 via INTRAVENOUS

## 2017-10-01 MED ORDER — CHLORHEXIDINE GLUCONATE CLOTH 2 % EX PADS
6.0000 | MEDICATED_PAD | Freq: Once | CUTANEOUS | Status: AC
  Administered 2017-10-01: 6 via TOPICAL

## 2017-10-01 NOTE — Anesthesia Procedure Notes (Signed)
Procedure Name: MAC Date/Time: 10/01/2017 8:43 AM Performed by: Vista Deck, CRNA Pre-anesthesia Checklist: Patient identified, Emergency Drugs available, Suction available, Timeout performed and Patient being monitored Patient Re-evaluated:Patient Re-evaluated prior to induction Oxygen Delivery Method: Non-rebreather mask

## 2017-10-01 NOTE — Transfer of Care (Signed)
Immediate Anesthesia Transfer of Care Note  Patient: Stacey Nash  Procedure(s) Performed: COLONOSCOPY WITH PROPOFOL (N/A )  Patient Location: PACU  Anesthesia Type:General  Level of Consciousness: awake, alert  and patient cooperative  Airway & Oxygen Therapy: Patient Spontanous Breathing and Patient connected to nasal cannula oxygen  Post-op Assessment: Report given to RN and Post -op Vital signs reviewed and stable  Post vital signs: Reviewed and stable  Last Vitals:  Vitals Value Taken Time  BP    Temp    Pulse    Resp    SpO2      Last Pain:  Vitals:   10/01/17 0716  TempSrc: Oral  PainSc: 0-No pain         Complications: No apparent anesthesia complications

## 2017-10-01 NOTE — H&P (Signed)
Stacey Nash is an 67 y.o. female.   Chief Complaint: Patient is here for colonoscopy. HPI: Patient is 67 year old Caucasian female who presents with 90-monthhistory of diarrhea.  She was noted to have heme positive stool.  She denies melena or rectal bleeding.  She states on her worst days she has as many as 12-13 stools but in between she has normal stools.  She also complains of intermittent cramping in left lower quadrant of her abdomen.  She does not have a good appetite and she has lost 8 pounds.  She denies nausea vomiting fever or chills. She states last colonoscopy was normal 15 years ago. Family history is significant for Crohn's disease and a younger brother.  Family history is negative for CRC.  Past Medical History:  Diagnosis Date  . Allergy or intolerance to drug    Doxycycline caused worsening shortness of breath and loose stools.  . Asthma   . Chronic neck pain   . COPD (chronic obstructive pulmonary disease) ([place]   . Diverticula of intestine   . Hypertension   . Left adrenal mass ([place]   . Osteoporosis   . Tobacco abuse     Past Surgical History:  Procedure Laterality Date  . APPENDECTOMY     2013  . CHOLECYSTECTOMY    . ESOPHAGOGASTRODUODENOSCOPY N/A 09/09/2014   Procedure: ESOPHAGOGASTRODUODENOSCOPY (EGD);  Surgeon: NRogene Houston MD;  Location: [facility];  Service: Endoscopy;  Laterality: N/A;  210 - moved to 9:10 - Ann to notify pt  . EYE SURGERY     cataract/implants  . LAPAROSCOPIC APPENDECTOMY  07/14/2011   Procedure: APPENDECTOMY LAPAROSCOPIC;  Surgeon: BDonato Heinz MD;  Location: [facility];  Service: General;  Laterality: N/A;    Family History  Problem Relation Age of Onset  . Hypertension Mother   . Thyroid disease Mother   . Diabetes Father   . Alcoholism Father   . Crohn's disease Brother   . Hypertension Son    Social History:  reports that she quit smoking about 4 years ago. Her smoking use included cigarettes. She has a 100.00  pack-year smoking history. She quit smokeless tobacco use about 4 years ago. She reports that she does not drink alcohol or use drugs.  Allergies:  Allergies  Allergen Reactions  . Sulfa Antibiotics Shortness Of Breath  . Doxycycline Diarrhea  . Statins     Leg pain and weakness  . Zofran [Ondansetron Hcl] Hives and Rash    Rash at injection site that spread immediately following administration      Medications Prior to Admission  Medication Sig Dispense Refill  . acetaminophen (TYLENOL) 500 MG tablet Take 500 mg by mouth daily as needed for moderate pain.    .Marland Kitchenalbuterol (PROVENTIL HFA;VENTOLIN HFA) 108 (90 BASE) MCG/ACT inhaler Inhale 2 puffs into the lungs every 6 (six) hours as needed for wheezing or shortness of breath.     .Marland Kitchenalbuterol (PROVENTIL) (2.5 MG/3ML) 0.083% nebulizer solution Take 2.5 mg by nebulization every 6 (six) hours as needed for wheezing or shortness of breath.    .Marland Kitchenaluminum hydroxide-magnesium carbonate (GAVISCON) 95-358 MG/15ML SUSP Take 15 mLs by mouth as needed for indigestion or heartburn.    .Marland KitchenamLODipine (NORVASC) 10 MG tablet Take 1 tablet (10 mg total) by mouth daily. (Patient taking differently: Take 5 mg by mouth daily. ) 90 tablet 3  . Calcium Carb-Cholecalciferol (CALCIUM 600 + D PO) Take 1 tablet by mouth 2 (two) times daily.    .Marland Kitchen  feeding supplement (BOOST HIGH PROTEIN) LIQD Take 1 Container by mouth daily.     Marland Kitchen loperamide (IMODIUM) 2 MG capsule Take 1 capsule (2 mg total) by mouth 4 (four) times daily as needed for diarrhea or loose stools. (Patient taking differently: Take 2 mg by mouth as needed for diarrhea or loose stools. ) 12 capsule 0  . Menthol, Topical Analgesic, (ICY HOT EX) Apply 1 application topically daily as needed (pain).    . Omega-3 Fatty Acids (FISH OIL) 1000 MG CAPS Take 1,000 mg by mouth 2 (two) times daily.     Marland Kitchen tiotropium (SPIRIVA HANDIHALER) 18 MCG inhalation capsule Place 18 mcg into inhaler and inhale every evening.       No  results found for this or any previous visit (from the past 48 hour(s)). No results found.  ROS  Blood pressure (!) 142/65, pulse 84, temperature 97.8 F (36.6 C), temperature source Oral, resp. rate 17, SpO2 98 %. Physical Exam  Constitutional:  Well-developed thin Caucasian female in NAD.  HENT:  Mouth/Throat: Oropharynx is clear and moist.  Patient is edentulous.  Eyes: Conjunctivae are normal. No scleral icterus.  Neck: No thyromegaly present.  Cardiovascular: Normal rate, regular rhythm and normal heart sounds.  No murmur heard. Respiratory: Effort normal and breath sounds normal.  GI:  Laparoscopy scars noted from previous cholecystectomy. Abdomen is soft with mild tenderness in RLQ.  No organomegaly or masses.  Musculoskeletal: She exhibits no edema.  Lymphadenopathy:    She has no cervical adenopathy.  Neurological: She is alert.  Skin: Skin is warm and dry.    Lab data from 09/24/2017 CBC normal. Comprehensive chemistry panel normal except bilirubin of 1.4 but normal alkaline phosphatase and transaminases.  Assessment/Plan Heme positive stool and diarrhea. Diagnostic colonoscopy.  Hildred Laser, MD 10/01/2017, 8:38 AM

## 2017-10-01 NOTE — Anesthesia Postprocedure Evaluation (Signed)
Anesthesia Post Note  Patient: Stacey Nash  Procedure(s) Performed: COLONOSCOPY WITH PROPOFOL (N/A ) BIOPSY POLYPECTOMY  Patient location during evaluation: PACU Anesthesia Type: General Level of consciousness: awake and alert Pain management: satisfactory to patient Vital Signs Assessment: post-procedure vital signs reviewed and stable Respiratory status: spontaneous breathing and patient connected to nasal cannula oxygen Cardiovascular status: stable Postop Assessment: no apparent nausea or vomiting Anesthetic complications: no     Last Vitals:  Vitals:   10/01/17 0945 10/01/17 1000  BP: 138/77 (!) 145/81  Pulse: 83 80  Resp: 12 12  Temp:    SpO2: 100% 98%    Last Pain:  Vitals:   10/01/17 1000  TempSrc:   PainSc: 0-No pain                 Vikrant Pryce

## 2017-10-01 NOTE — Telephone Encounter (Signed)
Left generic message requesting call back.   Trying to inform patient of Thyroid ultrasound appt info: Date 10/04/17 @ 11:30 am needs to be there at 11:15am at [facility]

## 2017-10-01 NOTE — Telephone Encounter (Signed)
Gave patient thyroid ultrasound info via [employer]

## 2017-10-01 NOTE — Op Note (Signed)
[facility] Patient Name: Stacey Nash Procedure Date: 10/01/2017 8:21 AM MRN: [id number] Date of Birth: 04-08-1951 Attending MD: Hildred Laser , MD CSN: [id number] Age: 67 Admit Type: Outpatient Procedure:                Colonoscopy Indications:              Diarrhea, Rectal bleeding Providers:                Hildred Laser, MD, Lurline Del, RN, Nelma Rothman,                            Technician Referring MD:             Caren Macadam, MD Medicines:                Propofol per Anesthesia Complications:            No immediate complications. Estimated Blood Loss:     Estimated blood loss was minimal. Procedure:                Pre-Anesthesia Assessment:                           - Prior to the procedure, a History and Physical                            was performed, and patient medications and                            allergies were reviewed. The patient's tolerance of                            previous anesthesia was also reviewed. The risks                            and benefits of the procedure and the sedation                            options and risks were discussed with the patient.                            All questions were answered, and informed consent                            was obtained. Prior Anticoagulants: The patient has                            taken no previous anticoagulant or antiplatelet                            agents. ASA Grade Assessment: III - A patient with                            severe systemic disease. After reviewing the risks  and benefits, the patient was deemed in                            satisfactory condition to undergo the procedure.                           After obtaining informed consent, the colonoscope                            was passed under direct vision. Throughout the                            procedure, the patient's blood pressure, pulse, and                            oxygen  saturations were monitored continuously. The                            [id number] ([id number]) scope was introduced through                            the anus and advanced to the the terminal ileum,                            with identification of the appendiceal orifice and                            IC valve. The terminal ileum, ileocecal valve,                            appendiceal orifice, and rectum were photographed. Scope In: 8:52:53 AM Scope Out: 9:18:03 AM Scope Withdrawal Time: 0 hours 14 minutes 28 seconds  Total Procedure Duration: 0 hours 25 minutes 10 seconds  Findings:      The perianal and digital rectal examinations were normal.      The terminal ileum appeared normal.      A localized area of mildly congested, erythematous and eroded mucosa was       found in the ascending colon. Biopsies were taken with a cold forceps       for histology. The pathology specimen was placed into Bottle Number 1.      A localized area of mildly congested, erythematous and eroded mucosa was       found in the sigmoid colon. Biopsies were taken with a cold forceps for       histology. The pathology specimen was placed into Bottle Number 2.      Scattered medium-mouthed diverticula were found in the sigmoid colon.      A small polyp was found in the rectum. The polyp was sessile. Biopsies       were taken with a cold forceps for histology. The pathology specimen was       placed into Bottle Number 3.      No additional abnormalities were found on retroflexion. Impression:               - The examined portion of the ileum was normal.                           -  Congested, erythematous and eroded mucosa in the                            ascending colon. Biopsied.                           - Congested, erythematous and eroded mucosa in the                            sigmoid colon. Biopsied.                           - Diverticulosis in the sigmoid colon.                           - One small  polyp in the rectum. Biopsied.                           - Stool aspirated for GI Path panel.                           Comment: mild focal colitis at ascending and                            sigmoid colon. Moderate Sedation:      Per Anesthesia Care Recommendation:           - Patient has a contact number available for                            emergencies. The signs and symptoms of potential                            delayed complications were discussed with the                            patient. Return to normal activities tomorrow.                            Written discharge instructions were provided to the                            patient.                           - Resume previous diet today.                           - Continue present medications.                           - Await pathology results.                           - Repeat colonoscopy is recommended. The  colonoscopy date will be determined after pathology                            results from today's exam become available for                            review. Procedure Code(s):        --- Professional ---                           [phone], Colonoscopy, flexible; with biopsy, single                            or multiple Diagnosis Code(s):        --- Professional ---                           K63.89, Other specified diseases of intestine                           K62.1, Rectal polyp                           R19.7, Diarrhea, unspecified                           K62.5, Hemorrhage of anus and rectum                           K57.30, Diverticulosis of large intestine without                            perforation or abscess without bleeding CPT copyright 2017 American Medical Association. All rights reserved. The codes documented in this report are preliminary and upon coder review may  be revised to meet current compliance requirements. Hildred Laser, MD Hildred Laser, MD 10/01/2017  9:33:59 AM This report has been signed electronically. Number of Addenda: 0

## 2017-10-01 NOTE — Discharge Instructions (Signed)
Colonoscopy, Adult, Care After This sheet gives you information about how to care for yourself after your procedure. Your doctor may also give you more specific instructions. If you have problems or questions, call your doctor. Follow these instructions at home: General instructions   For the first 24 hours after the procedure: ? Do not drive or use machinery. ? Do not sign important documents. ? Do not drink alcohol. ? Do your daily activities more slowly than normal. ? Eat foods that are soft and easy to digest. ? Rest often.  Take over-the-counter or prescription medicines only as told by your doctor.  It is up to you to get the results of your procedure. Ask your doctor, or the department performing the procedure, when your results will be ready. To help cramping and bloating:  Try walking around.  Put heat on your belly (abdomen) as told by your doctor. Use a heat source that your doctor recommends, such as a moist heat pack or a heating pad. ? Put a towel between your skin and the heat source. ? Leave the heat on for 20-30 minutes. ? Remove the heat if your skin turns bright red. This is especially important if you cannot feel pain, heat, or cold. You can get burned. Eating and drinking  Drink enough fluid to keep your pee (urine) clear or pale yellow.  Return to your normal diet as told by your doctor. Avoid heavy or fried foods that are hard to digest.  Avoid drinking alcohol for as long as told by your doctor. Contact a doctor if:  You have blood in your poop (stool) 2-3 days after the procedure. Get help right away if:  You have more than a small amount of blood in your poop.  You see large clumps of tissue (blood clots) in your poop.  Your belly is swollen.  You feel sick to your stomach (nauseous).  You throw up (vomit).  You have a fever.  You have belly pain that gets worse, and medicine does not help your pain. This information is not intended to  replace advice given to you by your health care provider. Make sure you discuss any questions you have with your health care provider. Document Released: 06/17/2010 Document Revised: 02/07/2016 Document Reviewed: 02/07/2016 Elsevier Interactive Patient Education  2017 [place] usual medications as before. Can take Imodium OTC 2 mg once or twice daily as needed. Resume usual diet. No driving for 24 hours. Physician will call with results of stool test and biopsy results.

## 2017-10-01 NOTE — Anesthesia Procedure Notes (Signed)
Performed by: Vista Deck, CRNA

## 2017-10-01 NOTE — Anesthesia Preprocedure Evaluation (Signed)
Anesthesia Evaluation  Patient identified by MRN, date of birth, ID band  Reviewed: Allergy & Precautions, H&P , Patient's Chart, lab work & pertinent test results  Airway Mallampati: I  TM Distance: >3 FB Neck ROM: full    Dental   Pulmonary neg pulmonary ROS, asthma , COPD,  oxygen dependent, former smoker,    Pulmonary exam normal        Cardiovascular Exercise Tolerance: Good hypertension, negative cardio ROS Normal cardiovascular examII     Neuro/Psych negative neurological ROS  negative psych ROS   GI/Hepatic negative GI ROS, Neg liver ROS,   Endo/Other  negative endocrine ROSTSH slightly low  Recently found  Renal/GU negative Renal ROS  negative genitourinary   Musculoskeletal   Abdominal   Peds  Hematology negative hematology ROS (+)   Anesthesia Other Findings   Reproductive/Obstetrics negative OB ROS                             Anesthesia Physical Anesthesia Plan  ASA: III  Anesthesia Plan: General   Post-op Pain Management:    Induction:   PONV Risk Score and Plan:   Airway Management Planned:   Additional Equipment:   Intra-op Plan:   Post-operative Plan:   Informed Consent: I have reviewed the patients History and Physical, chart, labs and discussed the procedure including the risks, benefits and alternatives for the proposed anesthesia with the patient or authorized representative who has indicated his/her understanding and acceptance.     Plan Discussed with: CRNA  Anesthesia Plan Comments:         Anesthesia Quick Evaluation

## 2017-10-01 NOTE — Anesthesia Procedure Notes (Signed)
Procedure Name: Intubation Performed by: Vista Deck, CRNA Pre-anesthesia Checklist: Patient identified, Patient being monitored, Timeout performed, Emergency Drugs available and Suction available Patient Re-evaluated:Patient Re-evaluated prior to induction Oxygen Delivery Method: Circle System Utilized Preoxygenation: Pre-oxygenation with 100% oxygen Induction Type: IV induction Ventilation: Mask ventilation without difficulty Laryngoscope Size: Miller and 2 Grade View: Grade I Tube type: Oral Tube size: 7.0 mm Number of attempts: 1 Airway Equipment and Method: Stylet Placement Confirmation: ETT inserted through vocal cords under direct vision,  positive ETCO2 and breath sounds checked- equal and bilateral Secured at: 21 cm Tube secured with: Tape Dental Injury: Teeth and Oropharynx as per pre-operative assessment

## 2017-10-02 ENCOUNTER — Ambulatory Visit: Payer: Medicare Other | Admitting: Family Medicine

## 2017-10-04 ENCOUNTER — Ambulatory Visit (HOSPITAL_COMMUNITY)
Admission: RE | Admit: 2017-10-04 | Discharge: 2017-10-04 | Disposition: A | Discharge status: Home / Self Care | Payer: Medicare Other | Source: Ambulatory Visit | Attending: Family Medicine | Admitting: Family Medicine

## 2017-10-04 DIAGNOSIS — E042 Nontoxic multinodular goiter: Secondary | ICD-10-CM | POA: Diagnosis not present

## 2017-10-04 DIAGNOSIS — E0590 Thyrotoxicosis, unspecified without thyrotoxic crisis or storm: Secondary | ICD-10-CM

## 2017-10-04 DIAGNOSIS — Z1231 Encounter for screening mammogram for malignant neoplasm of breast: Secondary | ICD-10-CM

## 2017-10-05 ENCOUNTER — Encounter (HOSPITAL_COMMUNITY): Payer: Self-pay | Admitting: Internal Medicine

## 2017-10-05 ENCOUNTER — Ambulatory Visit (INDEPENDENT_AMBULATORY_CARE_PROVIDER_SITE_OTHER): Payer: Medicare Other | Admitting: Internal Medicine

## 2017-10-05 ENCOUNTER — Encounter: Payer: Self-pay | Admitting: Family Medicine

## 2017-10-05 NOTE — Progress Notes (Signed)
Advised patient of results with verbal understanding.

## 2017-10-07 ENCOUNTER — Other Ambulatory Visit (INDEPENDENT_AMBULATORY_CARE_PROVIDER_SITE_OTHER): Payer: Self-pay | Admitting: Internal Medicine

## 2017-10-07 MED ORDER — MESALAMINE 400 MG PO CPDR: 800.0000 mg | DELAYED_RELEASE_CAPSULE | Freq: Two times a day (BID) | ORAL | 11 refills | Status: DC

## 2017-10-09 ENCOUNTER — Other Ambulatory Visit: Payer: Self-pay

## 2017-10-09 ENCOUNTER — Ambulatory Visit (INDEPENDENT_AMBULATORY_CARE_PROVIDER_SITE_OTHER): Payer: Medicare Other | Admitting: Family Medicine

## 2017-10-09 ENCOUNTER — Encounter: Payer: Self-pay | Admitting: Family Medicine

## 2017-10-09 VITALS — BP 154/86 | HR 95 | Temp 97.9°F | Resp 16 | Ht 62.0 in | Wt 115.0 lb

## 2017-10-09 DIAGNOSIS — E785 Hyperlipidemia, unspecified: Secondary | ICD-10-CM | POA: Diagnosis not present

## 2017-10-09 DIAGNOSIS — E278 Other specified disorders of adrenal gland: Secondary | ICD-10-CM

## 2017-10-09 DIAGNOSIS — R739 Hyperglycemia, unspecified: Secondary | ICD-10-CM | POA: Diagnosis not present

## 2017-10-09 DIAGNOSIS — Z23 Encounter for immunization: Secondary | ICD-10-CM

## 2017-10-09 DIAGNOSIS — I10 Essential (primary) hypertension: Secondary | ICD-10-CM

## 2017-10-09 DIAGNOSIS — E279 Disorder of adrenal gland, unspecified: Secondary | ICD-10-CM

## 2017-10-09 DIAGNOSIS — E0590 Thyrotoxicosis, unspecified without thyrotoxic crisis or storm: Secondary | ICD-10-CM

## 2017-10-09 MED ORDER — AMLODIPINE BESYLATE 5 MG PO TABS: 5.0000 mg | ORAL_TABLET | Freq: Every day | ORAL | 1 refills | Status: DC

## 2017-10-09 NOTE — Progress Notes (Signed)
Patient ID: Stacey Nash, female    DOB: 05/01/1951, 67 y.o.   MRN: [id number]  Chief Complaint  Patient presents with  . Hypertension    follow up    Allergies Sulfa antibiotics; Doxycycline; Statins; and Zofran [name]  Subjective:   Stacey Nash is a 67 y.o. female who presents to [facility] today.  HPI Stacey Nash presents today for follow-up of her blood pressure.  She brings in her blood pressure cuff that she uses at home.  Her blood pressure cuff was checked in comparison to the cuff in our office and they are both reading within 2 to 3 mmHg.  She brings in a reading of her blood pressures which at home have been running 110-134/62-72.  She reports she initially started on the Norvasc 10 mg a day.  She reports that after starting the medication she felt weak and her pressures were running low.  Therefore, she reports that she decrease the dose to 5 mg a day.  She reports her blood pressures have been running well.  She denies any cough, chest pain, shortness of breath, swelling in her extremities.  She denies any headache.  She reports that for many years she has had whitecoat hypertension.  She reports that she has not come in to get her repeat thyroid panel performed.  She did get the thyroid ultrasound which revealed multiple nodules present but none indicated for biopsy.  She reports that she has a follow-up appointment with endocrine scheduled in June.  She did have her colonoscopy performed by Dr.Rehman which revealed most likely per patient report ulcerative colitis.  She has not started medication for this because she is waiting for prior approval from [place].  She reports that she is feeling a little bit better and is much relieved to know that she does not have colon cancer.  She reports her breathing is good.  She is wearing her oxygen.  She is followed by Dr. Luan Pulling.  She is also here to discuss her cholesterol.  Her LDL is  greater than 180.  She reports that she understands that this increases her cardiovascular risk.  She does not want to take any medication for her cholesterol.  She reports that she really does not like taking medicines and does not wish to be on a cholesterol-lowering medication.   Past Medical History:  Diagnosis Date  . Allergy or intolerance to drug    Doxycycline caused worsening shortness of breath and loose stools.  . Asthma   . Chronic neck pain   . COPD (chronic obstructive pulmonary disease) ([place])   . Diverticula of intestine   . Hypertension   . Left adrenal mass ([place])   . Osteoporosis   . Tobacco abuse     Past Surgical History:  Procedure Laterality Date  . APPENDECTOMY     2013  . BIOPSY  10/01/2017   Procedure: BIOPSY;  Surgeon: Rogene Houston, MD;  [facility];  Service: Endoscopy;;  left and right colon  . CHOLECYSTECTOMY    . COLONOSCOPY WITH PROPOFOL N/A 10/01/2017   Procedure: COLONOSCOPY WITH PROPOFOL;  Surgeon: Rogene Houston, MD;  Location: [facility];  Service: Endoscopy;  Laterality: N/A;  . ESOPHAGOGASTRODUODENOSCOPY N/A 09/09/2014   Procedure: ESOPHAGOGASTRODUODENOSCOPY (EGD);  Surgeon: Rogene Houston, MD;  Location: [facility];  Service: Endoscopy;  Laterality: N/A;  210 - moved to 9:10 - Ann to notify pt  . EYE SURGERY  cataract/implants  . LAPAROSCOPIC APPENDECTOMY  07/14/2011   Procedure: APPENDECTOMY LAPAROSCOPIC;  Surgeon: Donato Heinz, MD;  Location: [facility] ORS;  Service: General;  Laterality: N/A;  . POLYPECTOMY  10/01/2017   Procedure: POLYPECTOMY;  Surgeon: Rogene Houston, MD;  [facility];  Service: Endoscopy;;  rectal    Family History  Problem Relation Age of Onset  . Hypertension Mother   . Thyroid disease Mother   . Diabetes Father   . Alcoholism Father   . Crohn's disease Brother   . Hypertension Son      Social History   Socioeconomic History  . Marital status: Widowed    Spouse name: Not on  file  . Number of children: Not on file  . Years of education: Not on file  . Highest education level: Not on file  Occupational History  . Not on file  Social Needs  . Financial resource strain: Not on file  . Food insecurity:    Worry: Not on file    Inability: Not on file  . Transportation needs:    Medical: Not on file    Non-medical: Not on file  Tobacco Use  . Smoking status: Former Smoker    Packs/day: 2.00    Years: 50.00    Pack years: 100.00    Types: Cigarettes    Last attempt to quit: 09/29/2013    Years since quitting: 4.0  . Smokeless tobacco: Former Systems developer    Quit date: 09/05/2013  . Tobacco comment: quit 4 yrs ago.   Substance and Sexual Activity  . Alcohol use: No    Alcohol/week: 0.0 oz  . Drug use: No  . Sexual activity: Not Currently  Lifestyle  . Physical activity:    Days per week: Not on file    Minutes per session: Not on file  . Stress: Not on file  Relationships  . Social connections:    Talks on phone: Not on file    Gets together: Not on file    Attends religious service: Not on file    Active member of club or organization: Not on file    Attends meetings of clubs or organizations: Not on file    Relationship status: Not on file  Other Topics Concern  . Not on file  Social History Narrative   Retired from book keeping and taxes.   Single, divorced.   3 boys.    Enjoys puzzles, reading, shopping.   Does not attend church, but watches tv church.   Does not eat red meat. Does not eat eggs or drink dairy.   Drives.   Wear seatbelt.    Youngest son lives with her.    Has grandchildren.    Current Outpatient Medications on File Prior to Visit  Medication Sig Dispense Refill  . acetaminophen (TYLENOL) 500 MG tablet Take 500 mg by mouth daily as needed for moderate pain.    Marland Kitchen albuterol (PROVENTIL HFA;VENTOLIN HFA) 108 (90 BASE) MCG/ACT inhaler Inhale 2 puffs into the lungs every 6 (six) hours as needed for wheezing or shortness of breath.      Marland Kitchen albuterol (PROVENTIL) (2.5 MG/3ML) 0.083% nebulizer solution Take 2.5 mg by nebulization every 6 (six) hours as needed for wheezing or shortness of breath.    Marland Kitchen aluminum hydroxide-magnesium carbonate (GAVISCON) 95-358 MG/15ML SUSP Take 15 mLs by mouth as needed for indigestion or heartburn.    . Calcium Carb-Cholecalciferol (CALCIUM 600 + D PO) Take 1 tablet by mouth 2 (  two) times daily.    . feeding supplement (BOOST HIGH PROTEIN) LIQD Take 1 Container by mouth daily.     Marland Kitchen loperamide (IMODIUM) 2 MG capsule Take 1 capsule (2 mg total) by mouth 4 (four) times daily as needed for diarrhea or loose stools. (Patient taking differently: Take 2 mg by mouth as needed for diarrhea or loose stools. ) 12 capsule 0  . Menthol, Topical Analgesic, (ICY HOT EX) Apply 1 application topically daily as needed (pain).    . mesalamine (APRISO) 0.375 g 24 hr capsule Take 1 capsule (0.375 g total) by mouth daily. Patient is to take Apriso 4 a day. 120 capsule 5  . Omega-3 Fatty Acids (FISH OIL) 1000 MG CAPS Take 1,000 mg by mouth 2 (two) times daily.     Marland Kitchen tiotropium (SPIRIVA HANDIHALER) 18 MCG inhalation capsule Place 18 mcg into inhaler and inhale every evening.      No current facility-administered medications on file prior to visit.     Review of Systems  Constitutional: Positive for fatigue. Negative for chills.  HENT: Negative for nosebleeds, postnasal drip, rhinorrhea, sinus pressure, sneezing, sore throat, trouble swallowing and voice change.   Respiratory: Negative for cough, chest tightness and stridor.   Gastrointestinal: Positive for abdominal pain and diarrhea. Negative for vomiting.       She reports that her abdominal pain and diarrhea are somewhat improved but she still does not feel great.  Genitourinary: Negative for frequency.  Musculoskeletal: Negative for back pain, gait problem and myalgias.  Neurological: Negative for dizziness, syncope, weakness, light-headedness, numbness and headaches.   Hematological: Negative for adenopathy. Does not bruise/bleed easily.  Psychiatric/Behavioral: Negative for dysphoric mood. The patient is not nervous/anxious.      Objective:   BP (!) 154/86 (BP Location: Left Arm, Patient Position: Sitting, Cuff Size: Normal) Comment: Patient's blood pressure at home are running 110-120/60-70s  Pulse 95   Temp 97.9 F (36.6 C) (Oral)   Resp 16   Ht 5' 2"  (1.575 m)   Wt 115 lb 0.6 oz (52.2 kg)   SpO2 98% Comment: w 2 lpm oxygen  BMI 21.04 kg/m   Physical Exam  Constitutional: She is oriented to person, place, and time. She appears well-developed and well-nourished. No distress.  Appearance older than stated age.  Oxygen via nasal cannula intact.  HENT:  Head: Normocephalic and atraumatic.  Eyes: Pupils are equal, round, and reactive to light.  Neck: Normal range of motion. Neck supple. No thyromegaly present.  Cardiovascular: Normal rate, regular rhythm and normal heart sounds.  Pulmonary/Chest: Effort normal and breath sounds normal. No respiratory distress.  Neurological: She is alert and oriented to person, place, and time. No cranial nerve deficit.  Skin: Skin is warm and dry.  Psychiatric: Her behavior is normal. Judgment and thought content normal.  Slightly anxious appearing.  Nursing note and vitals reviewed.  Depression screen [facility] 2/9 10/09/2017 09/18/2017  Decreased Interest 0 1  Down, Depressed, Hopeless 0 0  PHQ - 2 Score 0 1    Assessment and Plan  1. HTN, goal below 140/90 Patient's blood pressure readings from home were reviewed.  Her readings at home are well controlled.  Her blood pressure monitor was checked.  I do agree that patient has a diagnosis of whitecoat syndrome but also she has underlying hypertension.  She should continue the Norvasc 5 mg a day.  Will not start aspirin at this time secondary to her GI risks and bleeding risk. Lifestyle modifications  discussed with patient including a diet emphasizing vegetables,  fruits, and whole grains. Limiting intake of sodium to less than 2,400 mg per day.  Recommendations discussed include consuming low-fat dairy products, poultry, fish, legumes, non-tropical vegetable oils, and nuts; and limiting intake of sweets, sugar-sweetened beverages, and red meat. Discussed following a plan such as the Dietary Approaches to Stop Hypertension (DASH) diet. Patient to read up on this diet.   - amLODipine (NORVASC) 5 MG tablet; Take 1 tablet (5 mg total) by mouth daily.  Dispense: 90 tablet; Refill: 1  2. Elevated blood pressure reading in office with white coat syndrome, with diagnosis of hypertension   3. Left adrenal mass [facility]) Patient defers any work-up at this time.  We did discuss the left adrenal complex lesion that was seen on the CT scan.  She does not want to discuss this until her follow-up in July or August.  She does not want to undergo scanning again at this time.  She reports that she has had multiple lab tests done regarding this, although we have not received these records.  We will request records again.  We will plan on her discussing this with endocrinology at her appointment with them.  4. Hyperthyroidism Order ultrasound reviewed with patient.  Initial TSH was suppressed.  She will return to clinic to get the additional labs that were requested.  5. Immunization due Patient is agreeable to tetanus shot.  She defers any Pneumovax shots at this time. - Td : Tetanus/diphtheria >7yo Preservative  free  6.  Hyperlipidemia Hyperlipidemia and the associated risk of ASCVD were discussed today. Primary vs. Secondary prevention of ASCVD were discussed and how it relates to patient morbidity, mortality, and quality of life. Shared decision making with patient including the risks of statins vs.benefits of ASCVD risk reduction discussed.  Risks of stains discussed including myopathy, rhabdomyoloysis, liver problems, increased risk of diabetes discussed. We discussed  heart healthy diet, lifestyle modifications, risk factor modifications, and adherence to the recommended treatment plan. We discussed the need to periodically monitor lipid panel and liver function tests while on statin therapy.  Patient defers any statins at this time.  She understands increased cardiovascular risk and defers them. Return in about 3 months (around 01/09/2018), or if symptoms worsen or fail to improve. Caren Macadam, MD 10/09/2017

## 2017-10-10 ENCOUNTER — Telehealth: Payer: Self-pay | Admitting: Family Medicine

## 2017-10-10 ENCOUNTER — Encounter: Payer: Self-pay | Admitting: Family Medicine

## 2017-10-10 LAB — THYROID PANEL WITH TSH
Free Thyroxine Index: 3.3 (ref 1.4–3.8)
T3 Uptake: 26 % (ref 22–35)
T4, Total: 12.8 ug/dL — ABNORMAL HIGH (ref 5.1–11.9)
TSH: 0.27 m[IU]/L — AB (ref 0.40–4.50)

## 2017-10-10 LAB — HEMOGLOBIN A1C
Hgb A1c MFr Bld: 5.7 % of total Hgb — ABNORMAL HIGH (ref <5.7)
Mean Plasma Glucose: 117 (calc)
eAG (mmol/L): 6.5 (calc)

## 2017-10-10 NOTE — Telephone Encounter (Signed)
Please see if endocrine is able to see patient sooner.  She is hyperthyroid and experiencing diarrhea and some weight loss.  Her heart rate is within normal limits.  Please see if they have a sooner appointment.  If not she does have an appointment scheduled for June 11.

## 2017-10-11 NOTE — Telephone Encounter (Signed)
Patient has been rescheduled for 10/16/17 to see Dr.Nida and have been notified.

## 2017-10-13 DIAGNOSIS — Z881 Allergy status to other antibiotic agents status: Secondary | ICD-10-CM | POA: Diagnosis not present

## 2017-10-13 DIAGNOSIS — Z882 Allergy status to sulfonamides status: Secondary | ICD-10-CM | POA: Diagnosis not present

## 2017-10-13 DIAGNOSIS — Z888 Allergy status to other drugs, medicaments and biological substances status: Secondary | ICD-10-CM | POA: Diagnosis not present

## 2017-10-13 DIAGNOSIS — J449 Chronic obstructive pulmonary disease, unspecified: Secondary | ICD-10-CM | POA: Diagnosis not present

## 2017-10-13 DIAGNOSIS — Z9981 Dependence on supplemental oxygen: Secondary | ICD-10-CM | POA: Diagnosis not present

## 2017-10-13 DIAGNOSIS — L500 Allergic urticaria: Secondary | ICD-10-CM | POA: Diagnosis not present

## 2017-10-13 DIAGNOSIS — I10 Essential (primary) hypertension: Secondary | ICD-10-CM | POA: Diagnosis not present

## 2017-10-16 ENCOUNTER — Encounter: Payer: Self-pay | Admitting: "Endocrinology

## 2017-10-16 ENCOUNTER — Encounter: Payer: Self-pay | Admitting: Family Medicine

## 2017-10-16 ENCOUNTER — Ambulatory Visit (INDEPENDENT_AMBULATORY_CARE_PROVIDER_SITE_OTHER): Payer: Medicare Other | Admitting: "Endocrinology

## 2017-10-16 VITALS — BP 159/94 | HR 121 | Ht 62.0 in | Wt 113.0 lb

## 2017-10-16 DIAGNOSIS — E0590 Thyrotoxicosis, unspecified without thyrotoxic crisis or storm: Secondary | ICD-10-CM

## 2017-10-16 NOTE — Progress Notes (Signed)
Endocrinology Consult Note    Subjective:    Patient ID: Stacey Nash, female    DOB: [birth year], PCP Caren Macadam, MD.   Past Medical History:  Diagnosis Date  . Allergy or intolerance to drug    Doxycycline caused worsening shortness of breath and loose stools.  . Asthma   . Chronic neck pain   . COPD (chronic obstructive pulmonary disease) ([place])   . Diverticula of intestine   . Hypertension   . Left adrenal mass ([place])   . Osteoporosis   . Tobacco abuse    Past Surgical History:  Procedure Laterality Date  . APPENDECTOMY     2013  . BIOPSY  10/01/2017   Procedure: BIOPSY;  Surgeon: Rogene Houston, MD;  [facility];  Service: Endoscopy;;  left and right colon  . CHOLECYSTECTOMY    . COLONOSCOPY WITH PROPOFOL N/A 10/01/2017   Procedure: COLONOSCOPY WITH PROPOFOL;  Surgeon: Rogene Houston, MD;  [facility];  Service: Endoscopy;  Laterality: N/A;  . ESOPHAGOGASTRODUODENOSCOPY N/A 09/09/2014   Procedure: ESOPHAGOGASTRODUODENOSCOPY (EGD);  Surgeon: Rogene Houston, MD;  Location[facility];  Service: Endoscopy;  Laterality: N/A;  210 - moved to 9:10 - Ann to notify pt  . EYE SURGERY     cataract/implants  . LAPAROSCOPIC APPENDECTOMY  07/14/2011   Procedure: APPENDECTOMY LAPAROSCOPIC;  Surgeon: Donato Heinz, MD;  Location: [facility];  Service: General;  Laterality: N/A;  . POLYPECTOMY  10/01/2017   Procedure: POLYPECTOMY;  Surgeon: Rogene Houston, MD;  [facility];  Service: Endoscopy;;  rectal   Social History   Socioeconomic History  . Marital status: Widowed    Spouse name: Not on file  . Number of children: Not on file  . Years of education: Not on file  . Highest education level: Not on file  Occupational History  . Not on file  Social Needs  . Financial resource strain: Not on file  . Food insecurity:    Worry: Not on file    Inability: Not on file  . Transportation needs:    Medical: Not on file     Non-medical: Not on file  Tobacco Use  . Smoking status: Former Smoker    Packs/day: 2.00    Years: 50.00    Pack years: 100.00    Types: Cigarettes    Last attempt to quit: 09/29/2013    Years since quitting: 4.0  . Smokeless tobacco: Former Systems developer    Quit date: 09/05/2013  . Tobacco comment: quit 4 yrs ago.   Substance and Sexual Activity  . Alcohol use: No    Alcohol/week: 0.0 oz  . Drug use: No  . Sexual activity: Not Currently  Lifestyle  . Physical activity:    Days per week: Not on file    Minutes per session: Not on file  . Stress: Not on file  Relationships  . Social connections:    Talks on phone: Not on file    Gets together: Not on file    Attends religious service: Not on file    Active member of club or organization: Not on file    Attends meetings of clubs or organizations: Not on file    Relationship status: Not on file  Other Topics Concern  . Not on file  Social History Narrative   Retired from book keeping and taxes.   Single, divorced.   3 boys.    Enjoys puzzles, reading, shopping.  Does not attend church, but watches tv church.   Does not eat red meat. Does not eat eggs or drink dairy.   Drives.   Wear seatbelt.    Youngest son lives with her.    Has grandchildren.    Outpatient Encounter Medications as of 10/16/2017  Medication Sig  . acetaminophen (TYLENOL) 500 MG tablet Take 500 mg by mouth daily as needed for moderate pain.  Marland Kitchen albuterol (PROVENTIL HFA;VENTOLIN HFA) 108 (90 BASE) MCG/ACT inhaler Inhale 2 puffs into the lungs every 6 (six) hours as needed for wheezing or shortness of breath.   Marland Kitchen albuterol (PROVENTIL) (2.5 MG/3ML) 0.083% nebulizer solution Take 2.5 mg by nebulization every 6 (six) hours as needed for wheezing or shortness of breath.  Marland Kitchen aluminum hydroxide-magnesium carbonate (GAVISCON) 95-358 MG/15ML SUSP Take 15 mLs by mouth as needed for indigestion or heartburn.  Marland Kitchen amLODipine (NORVASC) 5 MG tablet Take 1 tablet (5 mg total) by  mouth daily.  . Calcium Carb-Cholecalciferol (CALCIUM 600 + D PO) Take 1 tablet by mouth 2 (two) times daily.  . feeding supplement (BOOST HIGH PROTEIN) LIQD Take 1 Container by mouth daily.   Marland Kitchen loperamide (IMODIUM) 2 MG capsule Take 1 capsule (2 mg total) by mouth 4 (four) times daily as needed for diarrhea or loose stools. (Patient taking differently: Take 2 mg by mouth as needed for diarrhea or loose stools. )  . Menthol, Topical Analgesic, (ICY HOT EX) Apply 1 application topically daily as needed (pain).  . mesalamine (APRISO) 0.375 g 24 hr capsule Take 1 capsule (0.375 g total) by mouth daily. Patient is to take Apriso 4 a day.  . Omega-3 Fatty Acids (FISH OIL) 1000 MG CAPS Take 1,000 mg by mouth 2 (two) times daily.   Marland Kitchen tiotropium (SPIRIVA HANDIHALER) 18 MCG inhalation capsule Place 18 mcg into inhaler and inhale every evening.    No facility-administered encounter medications on file as of 10/16/2017.     ALLERGIES: Allergies  Allergen Reactions  . Sulfa Antibiotics Shortness Of Breath  . Tetanus Toxoids Rash  . Doxycycline Diarrhea  . Statins     Leg pain and weakness  . Zofran [Ondansetron Hcl] Hives and Rash    Rash at injection site that spread immediately following administration      VACCINATION STATUS: Immunization History  Administered Date(s) Administered  . Td 10/09/2017     HPI  Stacey Nash is 67 y.o. female who presents today with a medical history as above. she is being seen in consultation for hyperthyroidism requested by Caren Macadam, MD.  she has been dealing with symptoms of weight loss of 10 pounds in 40-month tremors, palpitations, sleep disturbance for approximately 6 months. her most recent thyroid labs revealed , suppressed TSH and slightly above target total T4. Lab Results  Component Value Date   TSH 0.27 (L) 10/09/2017   TSH 0.36 (L) 09/24/2017   TSH 0.179 (L) 04/15/2013   . she denies dysphagia, choking, shortness of breath, no recent  voice change. These symptoms are progressively worsening and troubling to her.   she reports family history of thyroid dysfunction in her mother and 1 of her grandparents who did have unidentified thyroid dysfunction.  She denies family history of thyroid cancer.  - she denies personal history of goiter, has significant past history of smoking with COPD currently on portable oxygen supplement. she is not on any anti-thyroid medications nor on any thyroid hormone supplements. she  is willing to proceed with appropriate work  up and therapy for thyrotoxicosis.                           Review of systems  Constitutional: + weight loss, + fatigue, + subjective hyperthermia Eyes: no blurry vision, - xerophthalmia ENT: no sore throat, no nodules palpated in throat, no dysphagia/odynophagia, nor hoarseness Cardiovascular: no Chest Pain, no Shortness of Breath, ++  palpitations, no leg swelling Respiratory: no cough, no SOB Gastrointestinal: no Nausea, no Vomiting, no Diarhhea Musculoskeletal: no muscle/joint aches Skin: no rashes Neurological: ++  tremors, no numbness, no tingling, no dizziness Psychiatric: no depression, ++  anxiety   Objective:    BP (!) 159/94   Pulse (!) 121   Ht 5' 2"  (1.575 m)   Wt 113 lb (51.3 kg)   BMI 20.67 kg/m   Wt Readings from Last 3 Encounters:  10/16/17 113 lb (51.3 kg)  10/09/17 115 lb 0.6 oz (52.2 kg)  09/28/17 114 lb 12.8 oz (52.1 kg)                                                Physical exam  Constitutional: ++ Appropriate weight for height, not in acute distress, + chest state of mind, ++uses portable oxygen supplement Eyes: PERRLA, EOMI, - exophthalmos ENT: moist mucous membranes, -  thyromegaly, no cervical lymphadenopathy Cardiovascular: ++ precordial activity, ++ tachycardia, no Murmur/Rubs/Gallops Respiratory:  adequate breathing efforts, no gross chest deformity, Clear to auscultation bilaterally Gastrointestinal: abdomen soft, Non  -tender, No distension, Bowel Sounds present Musculoskeletal: no gross deformities, strength intact in all four extremities Skin: moist, warm, no rashes Neurological: ++  tremor with outstretched hands,  ++ Deep Tendon Reflexes are brisk on bilateral lower extremities    CMP     Component Value Date/Time   NA 137 09/24/2017 1050   K 4.0 09/24/2017 1050   CL 101 09/24/2017 1050   CO2 26 09/24/2017 1050   GLUCOSE 108 (H) 09/24/2017 1050   BUN 9 09/24/2017 1050   CREATININE 0.69 09/24/2017 1050   CALCIUM 10.0 09/24/2017 1050   PROT 7.4 09/24/2017 1050   ALBUMIN 4.8 09/21/2016 1538   AST 16 09/24/2017 1050   ALT 11 09/24/2017 1050   ALKPHOS 75 09/21/2016 1538   BILITOT 1.4 (H) 09/24/2017 1050   GFRNONAA 89 09/24/2017 1050   GFRAA 104 09/24/2017 1050     CBC    Component Value Date/Time   WBC 6.9 09/24/2017 1050   RBC 5.09 09/24/2017 1050   HGB 14.6 09/24/2017 1050   HCT 42.0 09/24/2017 1050   PLT 429 (H) 09/24/2017 1050   MCV 82.5 09/24/2017 1050   MCH 28.7 09/24/2017 1050   MCHC 34.8 09/24/2017 1050   RDW 12.9 09/24/2017 1050   LYMPHSABS 1,504 09/24/2017 1050   MONOABS 0.6 03/07/2016 0430   EOSABS 48 09/24/2017 1050   BASOSABS 62 09/24/2017 1050     Diabetic Labs (most recent): Lab Results  Component Value Date   HGBA1C 5.7 (H) 10/09/2017    Lipid Panel     Component Value Date/Time   CHOL 279 (H) 09/24/2017 1050   TRIG 229 (H) 09/24/2017 1050   HDL 45 (L) 09/24/2017 1050   CHOLHDL 6.2 (H) 09/24/2017 1050   LDLCALC 192 (H) 09/24/2017 1050     Lab Results  Component Value Date  TSH 0.27 (L) 10/09/2017   TSH 0.36 (L) 09/24/2017   TSH 0.179 (L) 04/15/2013        Assessment & Plan:   1. Hyperthyroidism  she is being seen at a kind request of Caren Macadam, MD. her history and most recent labs are reviewed, and she was examined clinically. Subjective and objective findings are consistent with thyrotoxicosis likely from primary hyperthyroidism.  The potential risks of untreated thyrotoxicosis and the need for definitive therapy have been discussed in detail with her, and she agrees to proceed with diagnostic workup and treatment plan.   -She will need confirmatory thyroid uptake and scan will be scheduled to be done as soon as possible.   Options of therapy are discussed with her. Definitive therapy may involve RAI ablation of the thyroid, with subsequent need for lifelong thyroid hormone replacement. she is made aware of this outcome  and she is  willing to proceed. she will return in 10 days for treatment decision.  -I did not initiate any new prescriptions for her today.  - I advised her to maintain close follow up with Caren Macadam, MD for primary care needs.   - Time spent with the patient: 45 minutes, of which >50% was spent in obtaining information about her symptoms, reviewing her previous labs, evaluations, and treatments, counseling her about her primary hyperthyroidism, and developing a plan to confirm the diagnosis and long term treatment as necessary.  Stacey Nash participated in the discussions, expressed understanding, and voiced agreement with the above plans.  All questions were answered to her satisfaction. she is encouraged to contact clinic should she have any questions or concerns prior to her return visit.  Follow up plan: Return in about 10 days (around 10/26/2017) for follow up with thyroid uptake and scan.   Thank you for involving me in the care of this pleasant patient, and I will continue to update you with her progress.  Glade Lloyd, MD [facility] Phone: [phone]  Fax: [phone]   10/16/2017, 1:52 PM  This note was partially dictated with voice recognition software. Similar sounding words can be transcribed inadequately or may not  be corrected upon review.

## 2017-10-18 ENCOUNTER — Encounter (INDEPENDENT_AMBULATORY_CARE_PROVIDER_SITE_OTHER): Payer: Self-pay | Admitting: Internal Medicine

## 2017-10-19 ENCOUNTER — Encounter (INDEPENDENT_AMBULATORY_CARE_PROVIDER_SITE_OTHER): Payer: Self-pay | Admitting: *Deleted

## 2017-10-23 ENCOUNTER — Other Ambulatory Visit (INDEPENDENT_AMBULATORY_CARE_PROVIDER_SITE_OTHER): Payer: Self-pay | Admitting: *Deleted

## 2017-10-23 MED ORDER — MESALAMINE ER 0.375 G PO CP24: 375.0000 mg | ORAL_CAPSULE | Freq: Every day | ORAL | 11 refills | Status: DC

## 2017-10-25 ENCOUNTER — Telehealth (INDEPENDENT_AMBULATORY_CARE_PROVIDER_SITE_OTHER): Payer: Self-pay | Admitting: Internal Medicine

## 2017-10-25 DIAGNOSIS — K5010 Crohn's disease of large intestine without complications: Secondary | ICD-10-CM

## 2017-10-25 MED ORDER — MESALAMINE ER 0.375 G PO CP24
375.0000 mg | ORAL_CAPSULE | Freq: Every day | ORAL | 5 refills | Status: DC
Start: 2017-10-25 — End: 2017-12-19

## 2017-10-25 NOTE — Telephone Encounter (Signed)
err

## 2017-10-30 ENCOUNTER — Other Ambulatory Visit (HOSPITAL_COMMUNITY): Payer: Medicare Other

## 2017-10-31 ENCOUNTER — Ambulatory Visit: Payer: Medicare Other | Admitting: "Endocrinology

## 2017-10-31 ENCOUNTER — Other Ambulatory Visit (HOSPITAL_COMMUNITY): Payer: Medicare Other

## 2017-11-01 ENCOUNTER — Encounter: Payer: Self-pay | Admitting: Family Medicine

## 2017-11-02 ENCOUNTER — Encounter: Payer: Self-pay | Admitting: Family Medicine

## 2017-11-05 ENCOUNTER — Encounter (HOSPITAL_COMMUNITY)
Admission: RE | Admit: 2017-11-05 | Discharge: 2017-11-05 | Disposition: A | Discharge status: Home / Self Care | Payer: Medicare Other | Source: Ambulatory Visit | Attending: "Endocrinology | Admitting: "Endocrinology

## 2017-11-05 ENCOUNTER — Encounter (HOSPITAL_COMMUNITY): Payer: Self-pay

## 2017-11-05 DIAGNOSIS — E0590 Thyrotoxicosis, unspecified without thyrotoxic crisis or storm: Secondary | ICD-10-CM | POA: Diagnosis not present

## 2017-11-05 MED ORDER — SODIUM IODIDE I-123 7.4 MBQ CAPS
300.0000 | ORAL_CAPSULE | Freq: Once | ORAL | Status: AC
  Administered 2017-11-05: 291 via ORAL

## 2017-11-06 ENCOUNTER — Encounter (HOSPITAL_COMMUNITY)
Admission: RE | Admit: 2017-11-06 | Discharge: 2017-11-06 | Disposition: A | Discharge status: Home / Self Care | Payer: Medicare Other | Source: Ambulatory Visit | Attending: "Endocrinology | Admitting: "Endocrinology

## 2017-11-06 ENCOUNTER — Ambulatory Visit: Payer: Self-pay | Admitting: "Endocrinology

## 2017-11-06 DIAGNOSIS — E0590 Thyrotoxicosis, unspecified without thyrotoxic crisis or storm: Secondary | ICD-10-CM | POA: Diagnosis not present

## 2017-11-13 ENCOUNTER — Encounter: Payer: Self-pay | Admitting: "Endocrinology

## 2017-11-13 ENCOUNTER — Ambulatory Visit (INDEPENDENT_AMBULATORY_CARE_PROVIDER_SITE_OTHER): Payer: Medicare Other | Admitting: "Endocrinology

## 2017-11-13 VITALS — BP 151/92 | HR 103 | Ht 62.0 in | Wt 114.0 lb

## 2017-11-13 DIAGNOSIS — E0590 Thyrotoxicosis, unspecified without thyrotoxic crisis or storm: Secondary | ICD-10-CM

## 2017-11-13 MED ORDER — PROPRANOLOL HCL 10 MG PO TABS
10.0000 mg | ORAL_TABLET | Freq: Two times a day (BID) | ORAL | 1 refills | Status: DC
Start: 2017-11-13 — End: 2017-12-06

## 2017-11-13 NOTE — Progress Notes (Signed)
Endocrinology follow-up note    Subjective:    Patient ID: Stacey Nash, female    DOB: [birth year], PCP Caren Macadam, MD.   Past Medical History:  Diagnosis Date  . Allergy or intolerance to drug    Doxycycline caused worsening shortness of breath and loose stools.  . Asthma   . Chronic neck pain   . COPD (chronic obstructive pulmonary disease) ([place])   . Diverticula of intestine   . Hypertension   . Left adrenal mass ([place])   . Osteoporosis   . Tobacco abuse    Past Surgical History:  Procedure Laterality Date  . APPENDECTOMY     2013  . BIOPSY  10/01/2017   Procedure: BIOPSY;  Surgeon: Rogene Houston, MD;  [facility];  Service: Endoscopy;;  left and right colon  . CHOLECYSTECTOMY    . COLONOSCOPY WITH PROPOFOL N/A 10/01/2017   Procedure: COLONOSCOPY WITH PROPOFOL;  Surgeon: Rogene Houston, MD;  [facility];  Service: Endoscopy;  Laterality: N/A;  . ESOPHAGOGASTRODUODENOSCOPY N/A 09/09/2014   Procedure: ESOPHAGOGASTRODUODENOSCOPY (EGD);  Surgeon: Rogene Houston, MD;  [facility];  Service: Endoscopy;  Laterality: N/A;  210 - moved to 9:10 - Ann to notify pt  . EYE SURGERY     cataract/implants  . LAPAROSCOPIC APPENDECTOMY  07/14/2011   Procedure: APPENDECTOMY LAPAROSCOPIC;  Surgeon: Donato Heinz, MD;  Location: [facility];  Service: General;  Laterality: N/A;  . POLYPECTOMY  10/01/2017   Procedure: POLYPECTOMY;  Surgeon: Rogene Houston, MD;  [facility];  Service: Endoscopy;;  rectal   Social History   Socioeconomic History  . Marital status: Widowed    Spouse name: Not on file  . Number of children: Not on file  . Years of education: Not on file  . Highest education level: Not on file  Occupational History  . Not on file  Social Needs  . Financial resource strain: Not on file  . Food insecurity:    Worry: Not on file    Inability: Not on file  . Transportation needs:    Medical: Not on file   Non-medical: Not on file  Tobacco Use  . Smoking status: Former Smoker    Packs/day: 2.00    Years: 50.00    Pack years: 100.00    Types: Cigarettes    Last attempt to quit: 09/29/2013    Years since quitting: 4.1  . Smokeless tobacco: Former Systems developer    Quit date: 09/05/2013  . Tobacco comment: quit 4 yrs ago.   Substance and Sexual Activity  . Alcohol use: No    Alcohol/week: 0.0 oz  . Drug use: No  . Sexual activity: Not Currently  Lifestyle  . Physical activity:    Days per week: Not on file    Minutes per session: Not on file  . Stress: Not on file  Relationships  . Social connections:    Talks on phone: Not on file    Gets together: Not on file    Attends religious service: Not on file    Active member of club or organization: Not on file    Attends meetings of clubs or organizations: Not on file    Relationship status: Not on file  Other Topics Concern  . Not on file  Social History Narrative   Retired from book keeping and taxes.   Single, divorced.   3 boys.    Enjoys puzzles, reading, shopping.  Does not attend church, but watches tv church.   Does not eat red meat. Does not eat eggs or drink dairy.   Drives.   Wear seatbelt.    Youngest son lives with her.    Has grandchildren.    Outpatient Encounter Medications as of 11/13/2017  Medication Sig  . acetaminophen (TYLENOL) 500 MG tablet Take 500 mg by mouth daily as needed for moderate pain.  Marland Kitchen albuterol (PROVENTIL HFA;VENTOLIN HFA) 108 (90 BASE) MCG/ACT inhaler Inhale 2 puffs into the lungs every 6 (six) hours as needed for wheezing or shortness of breath.   Marland Kitchen albuterol (PROVENTIL) (2.5 MG/3ML) 0.083% nebulizer solution Take 2.5 mg by nebulization every 6 (six) hours as needed for wheezing or shortness of breath.  Marland Kitchen aluminum hydroxide-magnesium carbonate (GAVISCON) 95-358 MG/15ML SUSP Take 15 mLs by mouth as needed for indigestion or heartburn.  Marland Kitchen amLODipine (NORVASC) 5 MG tablet Take 1 tablet (5 mg total) by  mouth daily.  . Calcium Carb-Cholecalciferol (CALCIUM 600 + D PO) Take 1 tablet by mouth 2 (two) times daily.  . feeding supplement (BOOST HIGH PROTEIN) LIQD Take 1 Container by mouth daily.   Marland Kitchen loperamide (IMODIUM) 2 MG capsule Take 1 capsule (2 mg total) by mouth 4 (four) times daily as needed for diarrhea or loose stools. (Patient taking differently: Take 2 mg by mouth as needed for diarrhea or loose stools. )  . Menthol, Topical Analgesic, (ICY HOT EX) Apply 1 application topically daily as needed (pain).  . mesalamine (APRISO) 0.375 g 24 hr capsule Take 1 capsule (0.375 g total) by mouth daily. Patient is to take Apriso 4 a day.  . Omega-3 Fatty Acids (FISH OIL) 1000 MG CAPS Take 1,000 mg by mouth 2 (two) times daily.   . propranolol (INDERAL) 10 MG tablet Take 1 tablet (10 mg total) by mouth 2 (two) times daily.  Marland Kitchen tiotropium (SPIRIVA HANDIHALER) 18 MCG inhalation capsule Place 18 mcg into inhaler and inhale every evening.    No facility-administered encounter medications on file as of 11/13/2017.     ALLERGIES: Allergies  Allergen Reactions  . Sulfa Antibiotics Shortness Of Breath  . Tetanus Toxoids Rash  . Doxycycline Diarrhea  . Statins     Leg pain and weakness  . Zofran [Ondansetron Hcl] Hives and Rash    Rash at injection site that spread immediately following administration      VACCINATION STATUS: Immunization History  Administered Date(s) Administered  . Td 10/09/2017     HPI  Stacey Nash is 67 y.o. female who is returning for follow-up with thyroid uptake and scan results.   -She was seen in consultation for hyperthyroidism on Oct 16, 2017.   -  she has been dealing with symptoms of unintended weight loss of 10 pounds in 11-month mild tremors, palpitations, sleep disturbance for approximately 6 months. her most recent thyroid labs revealed , suppressed TSH and slightly above target total T4.  Lab Results  Component Value Date   TSH 0.27 (L) 10/09/2017    TSH 0.36 (L) 09/24/2017   TSH 0.179 (L) 04/15/2013   . she denies dysphagia, choking, shortness of breath, no recent voice change.   she reports family history of thyroid dysfunction in her mother and 1 of her grandparents who did have unidentified thyroid dysfunction.  She denies family history of thyroid cancer.  - she denies personal history of goiter, has significant past history of smoking with COPD currently on portable oxygen supplement. she is not on  any anti-thyroid medications nor on any thyroid hormone supplements.                           Review of systems  Constitutional: + Steady weight, + fatigue, - subjective hyperthermia Eyes: no blurry vision, - xerophthalmia ENT: no sore throat, no nodules palpated in throat, no dysphagia/odynophagia, nor hoarseness Cardiovascular: no Chest Pain, + Shortness of Breath, +  palpitations, no leg swelling Respiratory: +cough, + SOB, + on portable oxygen supplement. Gastrointestinal: no Nausea, no Vomiting, no Diarhhea Musculoskeletal: no muscle/joint aches Skin: no rashes Neurological: +  tremors, no numbness, no tingling, no dizziness Psychiatric: no depression, +  anxiety   Objective:    BP (!) 151/92   Pulse (!) 103   Ht 5' 2"  (1.575 m)   Wt 114 lb (51.7 kg)   BMI 20.85 kg/m   Wt Readings from Last 3 Encounters:  11/13/17 114 lb (51.7 kg)  10/16/17 113 lb (51.3 kg)  10/09/17 115 lb 0.6 oz (52.2 kg)                                                Physical exam  Constitutional: + Appropriate weight for height with BMI of 20.8, not in acute distress,  ++uses portable oxygen supplement Eyes: PERRLA, EOMI, - exophthalmos ENT: moist mucous membranes, -  thyromegaly, no cervical lymphadenopathy Cardiovascular: + precordial activity, +tachycardia, no Murmur/Rubs/Gallops Respiratory:  adequate breathing efforts, no gross chest deformity, Clear to auscultation bilaterally Gastrointestinal: abdomen soft, Non -tender, No distension,  Bowel Sounds present Musculoskeletal: no gross deformities, strength intact in all four extremities Skin: moist, warm, no rashes Neurological: +  tremor with outstretched hands  CMP     Component Value Date/Time   NA 137 09/24/2017 1050   K 4.0 09/24/2017 1050   CL 101 09/24/2017 1050   CO2 26 09/24/2017 1050   GLUCOSE 108 (H) 09/24/2017 1050   BUN 9 09/24/2017 1050   CREATININE 0.69 09/24/2017 1050   CALCIUM 10.0 09/24/2017 1050   PROT 7.4 09/24/2017 1050   ALBUMIN 4.8 09/21/2016 1538   AST 16 09/24/2017 1050   ALT 11 09/24/2017 1050   ALKPHOS 75 09/21/2016 1538   BILITOT 1.4 (H) 09/24/2017 1050   GFRNONAA 89 09/24/2017 1050   GFRAA 104 09/24/2017 1050     CBC    Component Value Date/Time   WBC 6.9 09/24/2017 1050   RBC 5.09 09/24/2017 1050   HGB 14.6 09/24/2017 1050   HCT 42.0 09/24/2017 1050   PLT 429 (H) 09/24/2017 1050   MCV 82.5 09/24/2017 1050   MCH 28.7 09/24/2017 1050   MCHC 34.8 09/24/2017 1050   RDW 12.9 09/24/2017 1050   LYMPHSABS 1,504 09/24/2017 1050   MONOABS 0.6 03/07/2016 0430   EOSABS 48 09/24/2017 1050   BASOSABS 62 09/24/2017 1050     Diabetic Labs (most recent): Lab Results  Component Value Date   HGBA1C 5.7 (H) 10/09/2017    Lipid Panel     Component Value Date/Time   CHOL 279 (H) 09/24/2017 1050   TRIG 229 (H) 09/24/2017 1050   HDL 45 (L) 09/24/2017 1050   CHOLHDL 6.2 (H) 09/24/2017 1050   LDLCALC 192 (H) 09/24/2017 1050     Lab Results  Component Value Date   TSH 0.27 (L) 10/09/2017  TSH 0.36 (L) 09/24/2017   TSH 0.179 (L) 04/15/2013        Assessment & Plan:   1. Hyperthyroidism -Her labs showed mildly elevated total T4, and suppressed TSH.  -Her thyroid uptake and scan is normal or unremarkable.  She will not require ablative antithyroid treatment.  -He is approached for low-dose beta-blocker and she agrees.  -She has taken propranolol in the past and would be more open to propranolol.  -Discussed and initiated  propanolol 10 mg p.o. twice daily with plan to repeat thyroid function test in 3 months with office visit.   - I advised her to maintain close follow up with Caren Macadam, MD for primary care needs.  Follow up plan: Return in about 3 months (around 02/13/2018) for follow up with pre-visit labs.   Thank you for involving me in the care of this pleasant patient, and I will continue to update you with her progress.  Glade Lloyd, MD [facility] Phone: [phone]  Fax: [phone]   11/13/2017, 1:56 PM  This note was partially dictated with voice recognition software. Similar sounding words can be transcribed inadequately or may not  be corrected upon review.

## 2017-12-05 ENCOUNTER — Ambulatory Visit (INDEPENDENT_AMBULATORY_CARE_PROVIDER_SITE_OTHER): Payer: Medicare Other | Admitting: Internal Medicine

## 2017-12-06 ENCOUNTER — Other Ambulatory Visit: Payer: Self-pay | Admitting: "Endocrinology

## 2017-12-06 DIAGNOSIS — J449 Chronic obstructive pulmonary disease, unspecified: Secondary | ICD-10-CM | POA: Diagnosis not present

## 2017-12-06 DIAGNOSIS — J9611 Chronic respiratory failure with hypoxia: Secondary | ICD-10-CM | POA: Diagnosis not present

## 2017-12-06 DIAGNOSIS — I499 Cardiac arrhythmia, unspecified: Secondary | ICD-10-CM | POA: Diagnosis not present

## 2017-12-06 DIAGNOSIS — I10 Essential (primary) hypertension: Secondary | ICD-10-CM | POA: Diagnosis not present

## 2017-12-14 ENCOUNTER — Encounter: Payer: Self-pay | Admitting: "Endocrinology

## 2017-12-17 ENCOUNTER — Other Ambulatory Visit: Payer: Self-pay | Admitting: "Endocrinology

## 2017-12-19 ENCOUNTER — Encounter (INDEPENDENT_AMBULATORY_CARE_PROVIDER_SITE_OTHER): Payer: Self-pay | Admitting: Internal Medicine

## 2017-12-19 ENCOUNTER — Ambulatory Visit (INDEPENDENT_AMBULATORY_CARE_PROVIDER_SITE_OTHER): Payer: Medicare Other | Admitting: Internal Medicine

## 2017-12-19 VITALS — BP 150/100 | HR 76 | Temp 97.8°F | Resp 18 | Ht 62.0 in | Wt 117.9 lb

## 2017-12-19 DIAGNOSIS — K50111 Crohn's disease of large intestine with rectal bleeding: Secondary | ICD-10-CM

## 2017-12-19 LAB — CBC WITH DIFFERENTIAL/PLATELET
BASOS PCT: 1.2 %
Basophils Absolute: 83 cells/uL (ref 0–200)
EOS PCT: 1.6 %
Eosinophils Absolute: 110 cells/uL (ref 15–500)
HCT: 41.8 % (ref 35.0–45.0)
HEMOGLOBIN: 14 g/dL (ref 11.7–15.5)
Lymphs Abs: 1939 cells/uL (ref 850–3900)
MCH: 28.6 pg (ref 27.0–33.0)
MCHC: 33.5 g/dL (ref 32.0–36.0)
MCV: 85.5 fL (ref 80.0–100.0)
MONOS PCT: 8.4 %
MPV: 9.2 fL (ref 7.5–12.5)
NEUTROS ABS: 4188 {cells}/uL (ref 1500–7800)
Neutrophils Relative %: 60.7 %
Platelets: 439 10*3/uL — ABNORMAL HIGH (ref 140–400)
RBC: 4.89 10*6/uL (ref 3.80–5.10)
RDW: 12.6 % (ref 11.0–15.0)
Total Lymphocyte: 28.1 %
WBC mixed population: 580 cells/uL (ref 200–950)
WBC: 6.9 10*3/uL (ref 3.8–10.8)

## 2017-12-19 LAB — SEDIMENTATION RATE: SED RATE: 9 mm/h (ref 0–30)

## 2017-12-19 NOTE — Progress Notes (Signed)
   Subjective:    Patient ID: Stacey Nash, female    DOB: 12-15-1950, 67 y.o.   MRN: [id number]  HPI Here today for f/u. Un   Review of Systems     Objective:   Physical Exam        Assessment & Plan:

## 2017-12-19 NOTE — Patient Instructions (Signed)
Labs today. Apriso one in am and one in pm

## 2017-12-19 NOTE — Progress Notes (Signed)
Subjective:    Patient ID: Stacey Nash, female    DOB: 1950/06/28, 67 y.o.   MRN: [id number]  HPI Here today for f/u. Underwent a colonoscopy in May for diarrhea and rectal bleeding. Congested erythematous and eroded mucosa in ascending and sigmoid colon. Diverticulosis in sigmoid colon, One polyp in rectum.  She tells me she has had the diarrhea off and on for several years, but had become constant for 56 months. Biopsy : Crohn's disease.  She says she could not take the Apriso because of the Apriso. The Apriso caused her diarrhea so she stopped it. She says her BMs are normal at present.  No weight loss.  She has gained 2 pounds since her OV. She is having 3 stools a day in the morning.  She is not passing any blood.  No abdominal pain.     Review of Systems Past Medical History:  Diagnosis Date  . Allergy or intolerance to drug    Doxycycline caused worsening shortness of breath and loose stools.  . Asthma   . Chronic neck pain   . COPD (chronic obstructive pulmonary disease) ([place])   . Diverticula of intestine   . Hypertension   . Left adrenal mass ([place])   . Osteoporosis   . Tobacco abuse     Past Surgical History:  Procedure Laterality Date  . APPENDECTOMY     2013  . BIOPSY  10/01/2017   Procedure: BIOPSY;  Surgeon: Rogene Houston, MD;  [facility];  Service: Endoscopy;;  left and right colon  . CHOLECYSTECTOMY    . COLONOSCOPY WITH PROPOFOL N/A 10/01/2017   Procedure: COLONOSCOPY WITH PROPOFOL;  Surgeon: Rogene Houston, MD;  [facility];  Service: Endoscopy;  Laterality: N/A;  . ESOPHAGOGASTRODUODENOSCOPY N/A 09/09/2014   Procedure: ESOPHAGOGASTRODUODENOSCOPY (EGD);  Surgeon: Rogene Houston, MD;  [facility];  Service: Endoscopy;  Laterality: N/A;  210 - moved to 9:10 - Ann to notify pt  . EYE SURGERY     cataract/implants  . LAPAROSCOPIC APPENDECTOMY  07/14/2011   Procedure: APPENDECTOMY LAPAROSCOPIC;  Surgeon: Donato Heinz, MD;  Location: [facility];  Service: General;  Laterality: N/A;  . POLYPECTOMY  10/01/2017   Procedure: POLYPECTOMY;  Surgeon: Rogene Houston, MD;  [facility];  Service: Endoscopy;;  rectal    Allergies  Allergen Reactions  . Sulfa Antibiotics Shortness Of Breath  . Tetanus Toxoids Rash  . Doxycycline Diarrhea  . Statins     Leg pain and weakness  . Zofran [Ondansetron Hcl] Hives and Rash    Rash at injection site that spread immediately following administration      Current Outpatient Medications on File Prior to Visit  Medication Sig Dispense Refill  . acetaminophen (TYLENOL) 500 MG tablet Take 500 mg by mouth daily as needed for moderate pain.    Marland Kitchen albuterol (PROVENTIL HFA;VENTOLIN HFA) 108 (90 BASE) MCG/ACT inhaler Inhale 2 puffs into the lungs every 6 (six) hours as needed for wheezing or shortness of breath.     Marland Kitchen albuterol (PROVENTIL) (2.5 MG/3ML) 0.083% nebulizer solution Take 2.5 mg by nebulization every 6 (six) hours as needed for wheezing or shortness of breath.    Marland Kitchen aluminum hydroxide-magnesium carbonate (GAVISCON) 95-358 MG/15ML SUSP Take 15 mLs by mouth as needed for indigestion or heartburn.    Marland Kitchen amLODipine (NORVASC) 5 MG tablet Take 1 tablet (5 mg total) by mouth daily. 90 tablet 1  . Calcium  Carb-Cholecalciferol (CALCIUM 600 + D PO) Take 1 tablet by mouth 2 (two) times daily.    Marland Kitchen loperamide (IMODIUM) 2 MG capsule Take 1 capsule (2 mg total) by mouth 4 (four) times daily as needed for diarrhea or loose stools. (Patient taking differently: Take 2 mg by mouth as needed for diarrhea or loose stools. ) 12 capsule 0  . Menthol, Topical Analgesic, (ICY HOT EX) Apply 1 application topically daily as needed (pain).    . Multiple Vitamins-Minerals (CENTRUM SILVER ULTRA WOMENS PO) Take by mouth daily.    . Omega-3 Fatty Acids (FISH OIL) 1000 MG CAPS Take 1,000 mg by mouth 2 (two) times daily.     . propranolol (INDERAL) 10 MG tablet TAKE 1 TABLET BY MOUTH TWICE A  DAY 60 tablet 1  . tiotropium (SPIRIVA HANDIHALER) 18 MCG inhalation capsule Place 18 mcg into inhaler and inhale every evening.      No current facility-administered medications on file prior to visit.         Objective:   Physical Exam Blood pressure (!) 150/100, pulse 76, temperature 97.8 F (36.6 C), temperature source Oral, resp. rate 18, height 5' 2"  (1.575 m), weight 117 lb 14.4 oz (53.5 kg).   Alert and oriented. Skin warm and dry. Oral mucosa is moist.   . Sclera anicteric, conjunctivae is pink. Thyroid not enlarged. No cervical lymphadenopathy. Lungs clear. Heart regular rate and rhythm.  Abdomen is soft. Bowel sounds are positive. No hepatomegaly. No abdominal masses felt. No tenderness.  No edema to lower extremities.         Assessment & Plan:  Crohns. Am going to get a CBC and sedrate.  Apriso one in am and one in pm. Aprisc

## 2018-01-08 ENCOUNTER — Other Ambulatory Visit: Payer: Self-pay | Admitting: "Endocrinology

## 2018-01-09 ENCOUNTER — Ambulatory Visit: Payer: Medicare Other | Admitting: Family Medicine

## 2018-02-06 DIAGNOSIS — E0590 Thyrotoxicosis, unspecified without thyrotoxic crisis or storm: Secondary | ICD-10-CM | POA: Diagnosis not present

## 2018-02-06 LAB — TSH: TSH: 0.99 mIU/L (ref 0.40–4.50)

## 2018-02-06 LAB — T4, FREE: FREE T4: 1.1 ng/dL (ref 0.8–1.8)

## 2018-02-06 LAB — T3, FREE: T3, Free: 3.2 pg/mL (ref 2.3–4.2)

## 2018-02-14 ENCOUNTER — Ambulatory Visit (INDEPENDENT_AMBULATORY_CARE_PROVIDER_SITE_OTHER): Payer: Medicare Other | Admitting: "Endocrinology

## 2018-02-14 ENCOUNTER — Encounter: Payer: Self-pay | Admitting: "Endocrinology

## 2018-02-14 VITALS — BP 150/92 | HR 81 | Ht 62.0 in | Wt 121.0 lb

## 2018-02-14 DIAGNOSIS — I10 Essential (primary) hypertension: Secondary | ICD-10-CM | POA: Diagnosis not present

## 2018-02-14 DIAGNOSIS — E0590 Thyrotoxicosis, unspecified without thyrotoxic crisis or storm: Secondary | ICD-10-CM | POA: Diagnosis not present

## 2018-02-14 DIAGNOSIS — E279 Disorder of adrenal gland, unspecified: Secondary | ICD-10-CM | POA: Diagnosis not present

## 2018-02-14 DIAGNOSIS — E278 Other specified disorders of adrenal gland: Secondary | ICD-10-CM | POA: Insufficient documentation

## 2018-02-14 MED ORDER — AMLODIPINE BESYLATE 5 MG PO TABS: 5.0000 mg | ORAL_TABLET | Freq: Every day | ORAL | 6 refills | Status: DC

## 2018-02-14 MED ORDER — AMLODIPINE BESYLATE 10 MG PO TABS: 10.0000 mg | ORAL_TABLET | Freq: Every day | ORAL | 6 refills | Status: DC

## 2018-02-14 NOTE — Progress Notes (Signed)
Endocrinology follow-up note    Subjective:    Patient ID: Stacey Nash, female    DOB: 1950-12-21, PCP Caren Macadam, MD.   Past Medical History:  Diagnosis Date  . Allergy or intolerance to drug    Doxycycline caused worsening shortness of breath and loose stools.  . Asthma   . Chronic neck pain   . COPD (chronic obstructive pulmonary disease) ([place])   . Diverticula of intestine   . Hypertension   . Left adrenal mass ([place])   . Osteoporosis   . Tobacco abuse    Past Surgical History:  Procedure Laterality Date  . APPENDECTOMY     2013  . BIOPSY  10/01/2017   Procedure: BIOPSY;  Surgeon: Rogene Houston, MD;  [facility];  Service: Endoscopy;;  left and right colon  . CHOLECYSTECTOMY    . COLONOSCOPY WITH PROPOFOL N/A 10/01/2017   Procedure: COLONOSCOPY WITH PROPOFOL;  Surgeon: Rogene Houston, MD;  [facility];  Service: Endoscopy;  Laterality: N/A;  . ESOPHAGOGASTRODUODENOSCOPY N/A 09/09/2014   Procedure: ESOPHAGOGASTRODUODENOSCOPY (EGD);  Surgeon: Rogene Houston, MD;  [facility];  Service: Endoscopy;  Laterality: N/A;  210 - moved to 9:10 - Ann to notify pt  . EYE SURGERY     cataract/implants  . LAPAROSCOPIC APPENDECTOMY  07/14/2011   Procedure: APPENDECTOMY LAPAROSCOPIC;  Surgeon: Donato Heinz, MD;  Location: [facility];  Service: General;  Laterality: N/A;  . POLYPECTOMY  10/01/2017   Procedure: POLYPECTOMY;  Surgeon: Rogene Houston, MD;  [facility];  Service: Endoscopy;;  rectal   Social History   Socioeconomic History  . Marital status: Widowed    Spouse name: Not on file  . Number of children: Not on file  . Years of education: Not on file  . Highest education level: Not on file  Occupational History  . Not on file  Social Needs  . Financial resource strain: Not on file  . Food insecurity:    Worry: Not on file    Inability: Not on file  . Transportation needs:    Medical: Not on file   Non-medical: Not on file  Tobacco Use  . Smoking status: Former Smoker    Packs/day: 2.00    Years: 50.00    Pack years: 100.00    Types: Cigarettes    Last attempt to quit: 09/29/2013    Years since quitting: 4.3  . Smokeless tobacco: Former Systems developer    Quit date: 09/05/2013  . Tobacco comment: quit 4 yrs ago.   Substance and Sexual Activity  . Alcohol use: No    Alcohol/week: 0.0 standard drinks  . Drug use: No  . Sexual activity: Not Currently  Lifestyle  . Physical activity:    Days per week: Not on file    Minutes per session: Not on file  . Stress: Not on file  Relationships  . Social connections:    Talks on phone: Not on file    Gets together: Not on file    Attends religious service: Not on file    Active member of club or organization: Not on file    Attends meetings of clubs or organizations: Not on file    Relationship status: Not on file  Other Topics Concern  . Not on file  Social History Narrative   Retired from book keeping and taxes.   Single, divorced.   3 boys.    Enjoys puzzles, reading, shopping.  Does not attend church, but watches tv church.   Does not eat red meat. Does not eat eggs or drink dairy.   Drives.   Wear seatbelt.    Youngest son lives with her.    Has grandchildren.    Outpatient Encounter Medications as of 02/14/2018  Medication Sig  . acetaminophen (TYLENOL) 500 MG tablet Take 500 mg by mouth daily as needed for moderate pain.  Marland Kitchen albuterol (PROVENTIL HFA;VENTOLIN HFA) 108 (90 BASE) MCG/ACT inhaler Inhale 2 puffs into the lungs every 6 (six) hours as needed for wheezing or shortness of breath.   Marland Kitchen albuterol (PROVENTIL) (2.5 MG/3ML) 0.083% nebulizer solution Take 2.5 mg by nebulization every 6 (six) hours as needed for wheezing or shortness of breath.  Marland Kitchen aluminum hydroxide-magnesium carbonate (GAVISCON) 95-358 MG/15ML SUSP Take 15 mLs by mouth as needed for indigestion or heartburn.  Marland Kitchen amLODipine (NORVASC) 5 MG tablet Take 1 tablet (5 mg  total) by mouth daily.  . Calcium Carb-Cholecalciferol (CALCIUM 600 + D PO) Take 1 tablet by mouth 2 (two) times daily.  Marland Kitchen loperamide (IMODIUM) 2 MG capsule Take 1 capsule (2 mg total) by mouth 4 (four) times daily as needed for diarrhea or loose stools. (Patient taking differently: Take 2 mg by mouth as needed for diarrhea or loose stools. )  . Menthol, Topical Analgesic, (ICY HOT EX) Apply 1 application topically daily as needed (pain).  . Multiple Vitamins-Minerals (CENTRUM SILVER ULTRA WOMENS PO) Take by mouth daily.  . Omega-3 Fatty Acids (FISH OIL) 1000 MG CAPS Take 1,000 mg by mouth 2 (two) times daily.   . propranolol (INDERAL) 10 MG tablet TAKE 1 TABLET BY MOUTH TWICE A DAY  . tiotropium (SPIRIVA HANDIHALER) 18 MCG inhalation capsule Place 18 mcg into inhaler and inhale every evening.   . [DISCONTINUED] amLODipine (NORVASC) 10 MG tablet Take 1 tablet (10 mg total) by mouth daily.  . [DISCONTINUED] amLODipine (NORVASC) 5 MG tablet Take 1 tablet (5 mg total) by mouth daily.   No facility-administered encounter medications on file as of 02/14/2018.     ALLERGIES: Allergies  Allergen Reactions  . Sulfa Antibiotics Shortness Of Breath  . Tetanus Toxoids Rash  . Doxycycline Diarrhea  . Statins     Leg pain and weakness  . Zofran [Ondansetron Hcl] Hives and Rash    Rash at injection site that spread immediately following administration      VACCINATION STATUS: Immunization History  Administered Date(s) Administered  . Td 10/09/2017     HPI  Stacey Nash is 67 y.o. female who is returning for follow-up with thyroid function test.   -She is currently on low-dose propranolol 10 mg p.o. daily for nonspecific symptoms given her history of subclinical hyperthyroidism.  She did not require ablative antithyroid treatment.  She reports better energy, gained 7 pounds since last visit. -She has no new complaints today.  She denies tremors, palpitations.   Lab Results  Component  Value Date   TSH 0.99 02/06/2018   TSH 0.27 (L) 10/09/2017   TSH 0.36 (L) 09/24/2017   TSH 0.179 (L) 04/15/2013   FREET4 1.1 02/06/2018   . she denies dysphagia, choking, shortness of breath, no recent voice change.   she reports family history of thyroid dysfunction in her mother and 1 of her grandparents who did have unidentified thyroid dysfunction.  She denies family history of thyroid cancer.  - she denies personal history of goiter, has significant past history of smoking with COPD currently on portable  oxygen supplement. she is not on any anti-thyroid medications nor on any thyroid hormone supplements. 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                           Review of systems  Constitutional: + Gained weight,  + fatigue, - subjective hyperthermia Eyes: no blurry vision, - xerophthalmia ENT: no sore throat, no nodules palpated in throat, no dysphagia/odynophagia, nor hoarseness Cardiovascular: no Chest Pain, + Shortness of Breath, +  palpitations, no leg swelling Respiratory: +cough, + SOB, + on portable oxygen supplement. Gastrointestinal: no Nausea, no Vomiting, no Diarhhea Musculoskeletal: no muscle/joint aches Skin: no rashes Neurological: -  tremors, no numbness, no tingling, no dizziness Psychiatric: no depression, +  anxiety   Objective:    BP (!) 150/92   Pulse 81   Ht 5' 2"  (1.575 m)   Wt 121 lb (54.9 kg)   BMI 22.13 kg/m   Wt Readings from Last 3 Encounters:  02/14/18 121 lb (54.9 kg)  12/19/17 117 lb 14.4 oz (53.5 kg)  11/13/17 114 lb (51.7 kg)                                                Physical exam  Constitutional: + Appropriate body weight with a BMI of 22, not in acute distress.   ++uses portable oxygen supplement Eyes: PERRLA, EOMI, - exophthalmos ENT: moist mucous membranes, -  thyromegaly, no cervical lymphadenopathy Cardiovascular: + Normal precordial activity, tachycardia-resolved, no  Murmur/Rubs/Gallops Respiratory:  adequate breathing efforts, no gross chest deformity, Clear to auscultation bilaterally Gastrointestinal: abdomen soft, Non -tender, No distension, Bowel Sounds present Musculoskeletal: no gross deformities, strength intact in all four extremities Skin: moist, warm, no rashes Neurological: -  tremor with outstretched hands  CMP     Component Value Date/Time   NA 137 09/24/2017 1050   K 4.0 09/24/2017 1050   CL 101 09/24/2017 1050   CO2 26 09/24/2017 1050   GLUCOSE 108 (H) 09/24/2017 1050   BUN 9 09/24/2017 1050   CREATININE 0.69 09/24/2017 1050   CALCIUM 10.0 09/24/2017 1050   PROT 7.4 09/24/2017 1050   ALBUMIN 4.8 09/21/2016 1538   AST 16 09/24/2017 1050   ALT 11 09/24/2017 1050   ALKPHOS 75 09/21/2016 1538   BILITOT 1.4 (H) 09/24/2017 1050   GFRNONAA 89 09/24/2017 1050   GFRAA 104 09/24/2017 1050     CBC    Component Value Date/Time   WBC 6.9 12/19/2017 1109   RBC 4.89 12/19/2017 1109   HGB 14.0 12/19/2017 1109   HCT 41.8 12/19/2017 1109   PLT 439 (H) 12/19/2017 1109   MCV 85.5 12/19/2017 1109   MCH 28.6 12/19/2017 1109   MCHC 33.5 12/19/2017 1109   RDW 12.6 12/19/2017 1109   LYMPHSABS 1,939 12/19/2017 1109   MONOABS 0.6 03/07/2016 0430   EOSABS 110 12/19/2017 1109   BASOSABS 83 12/19/2017 1109     Diabetic Labs (most recent): Lab Results  Component Value Date   HGBA1C 5.7 (H) 10/09/2017    Lipid Panel     Component Value Date/Time   CHOL 279 (H) 09/24/2017 1050   TRIG 229 (H) 09/24/2017 1050   HDL 45 (L) 09/24/2017 1050   CHOLHDL 6.2 (H) 09/24/2017 1050   LDLCALC 192 (H) 09/24/2017  1050     Lab Results  Component Value Date   TSH 0.99 02/06/2018   TSH 0.27 (L) 10/09/2017   TSH 0.36 (L) 09/24/2017   TSH 0.179 (L) 04/15/2013   FREET4 1.1 02/06/2018        Assessment & Plan:   1. Hyperthyroidism -Her previsit labs are consistent with normal thyroid function.   -Her prior thyroid uptake and scan is  normal or unremarkable.  She will not require ablative antithyroid treatment.  -She has benefited from low-dose beta-blocker treatment.  She will continue on propranolol 10 mg p.o. twice daily.  -She reports that she cannot tolerate any higher dose of amlodipine to treat her blood pressure.  She wishes to stay on amlodipine 5 mg p.o.  daily.  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  - I advised her to maintain close follow up with Caren Macadam, MD for primary care needs.  Follow up plan: Return in about 6 months (around 08/15/2018) for Follow up with Pre-visit Labs.   Thank you for involving me in the care of this pleasant patient, and I will continue to update you with her progress.  Glade Lloyd, MD [facility] [place] [facility] Phone: [phone]  Fax: [phone]   02/14/2018, 1:15 PM  This note was partially dictated with voice recognition software. Similar sounding words can be transcribed inadequately or may not  be corrected upon review.

## 2018-02-20 DIAGNOSIS — E0590 Thyrotoxicosis, unspecified without thyrotoxic crisis or storm: Secondary | ICD-10-CM | POA: Diagnosis not present

## 2018-02-20 DIAGNOSIS — I10 Essential (primary) hypertension: Secondary | ICD-10-CM | POA: Diagnosis not present

## 2018-02-20 DIAGNOSIS — K5090 Crohn's disease, unspecified, without complications: Secondary | ICD-10-CM | POA: Diagnosis not present

## 2018-02-20 DIAGNOSIS — Z6822 Body mass index (BMI) 22.0-22.9, adult: Secondary | ICD-10-CM | POA: Diagnosis not present

## 2018-02-20 DIAGNOSIS — M810 Age-related osteoporosis without current pathological fracture: Secondary | ICD-10-CM | POA: Diagnosis not present

## 2018-02-20 DIAGNOSIS — J449 Chronic obstructive pulmonary disease, unspecified: Secondary | ICD-10-CM | POA: Diagnosis not present

## 2018-02-21 ENCOUNTER — Other Ambulatory Visit (HOSPITAL_COMMUNITY): Payer: Self-pay | Admitting: Adult Health Nurse Practitioner

## 2018-02-21 DIAGNOSIS — R0989 Other specified symptoms and signs involving the circulatory and respiratory systems: Secondary | ICD-10-CM

## 2018-02-28 ENCOUNTER — Other Ambulatory Visit (HOSPITAL_COMMUNITY): Payer: Self-pay | Admitting: Internal Medicine

## 2018-02-28 DIAGNOSIS — Z780 Asymptomatic menopausal state: Secondary | ICD-10-CM

## 2018-03-01 ENCOUNTER — Ambulatory Visit (HOSPITAL_COMMUNITY): Payer: Medicare Other

## 2018-03-04 ENCOUNTER — Ambulatory Visit (HOSPITAL_COMMUNITY)
Admission: RE | Admit: 2018-03-04 | Discharge: 2018-03-04 | Disposition: A | Discharge status: Home / Self Care | Payer: Medicare Other | Source: Ambulatory Visit | Attending: Adult Health Nurse Practitioner | Admitting: Adult Health Nurse Practitioner

## 2018-03-04 DIAGNOSIS — R0989 Other specified symptoms and signs involving the circulatory and respiratory systems: Secondary | ICD-10-CM | POA: Insufficient documentation

## 2018-03-04 DIAGNOSIS — I10 Essential (primary) hypertension: Secondary | ICD-10-CM | POA: Diagnosis not present

## 2018-03-04 DIAGNOSIS — Z131 Encounter for screening for diabetes mellitus: Secondary | ICD-10-CM | POA: Diagnosis not present

## 2018-03-04 DIAGNOSIS — Z8679 Personal history of other diseases of the circulatory system: Secondary | ICD-10-CM | POA: Diagnosis not present

## 2018-03-04 DIAGNOSIS — M810 Age-related osteoporosis without current pathological fracture: Secondary | ICD-10-CM | POA: Diagnosis not present

## 2018-03-04 DIAGNOSIS — E559 Vitamin D deficiency, unspecified: Secondary | ICD-10-CM | POA: Diagnosis not present

## 2018-03-06 ENCOUNTER — Other Ambulatory Visit: Payer: Self-pay | Admitting: "Endocrinology

## 2018-03-06 DIAGNOSIS — Z0000 Encounter for general adult medical examination without abnormal findings: Secondary | ICD-10-CM | POA: Diagnosis not present

## 2018-03-06 DIAGNOSIS — M810 Age-related osteoporosis without current pathological fracture: Secondary | ICD-10-CM | POA: Diagnosis not present

## 2018-03-06 DIAGNOSIS — J449 Chronic obstructive pulmonary disease, unspecified: Secondary | ICD-10-CM | POA: Diagnosis not present

## 2018-03-06 DIAGNOSIS — E0789 Other specified disorders of thyroid: Secondary | ICD-10-CM | POA: Diagnosis not present

## 2018-03-06 DIAGNOSIS — E0590 Thyrotoxicosis, unspecified without thyrotoxic crisis or storm: Secondary | ICD-10-CM | POA: Diagnosis not present

## 2018-03-06 DIAGNOSIS — R000 Tachycardia, unspecified: Secondary | ICD-10-CM | POA: Diagnosis not present

## 2018-03-06 DIAGNOSIS — I10 Essential (primary) hypertension: Secondary | ICD-10-CM | POA: Diagnosis not present

## 2018-03-06 DIAGNOSIS — Z6822 Body mass index (BMI) 22.0-22.9, adult: Secondary | ICD-10-CM | POA: Diagnosis not present

## 2018-03-06 DIAGNOSIS — R7301 Impaired fasting glucose: Secondary | ICD-10-CM | POA: Diagnosis not present

## 2018-03-06 DIAGNOSIS — E559 Vitamin D deficiency, unspecified: Secondary | ICD-10-CM | POA: Diagnosis not present

## 2018-03-06 DIAGNOSIS — K5090 Crohn's disease, unspecified, without complications: Secondary | ICD-10-CM | POA: Diagnosis not present

## 2018-03-06 DIAGNOSIS — E782 Mixed hyperlipidemia: Secondary | ICD-10-CM | POA: Diagnosis not present

## 2018-03-16 IMAGING — DX DG CHEST 2V
2 series · 2 of 2 positions shown · non-contrast
Comparison: 07/12/2015

CLINICAL DATA: Abdominal pain.  Hypertension.

EXAM:
CHEST  2 VIEW

[chest pa]
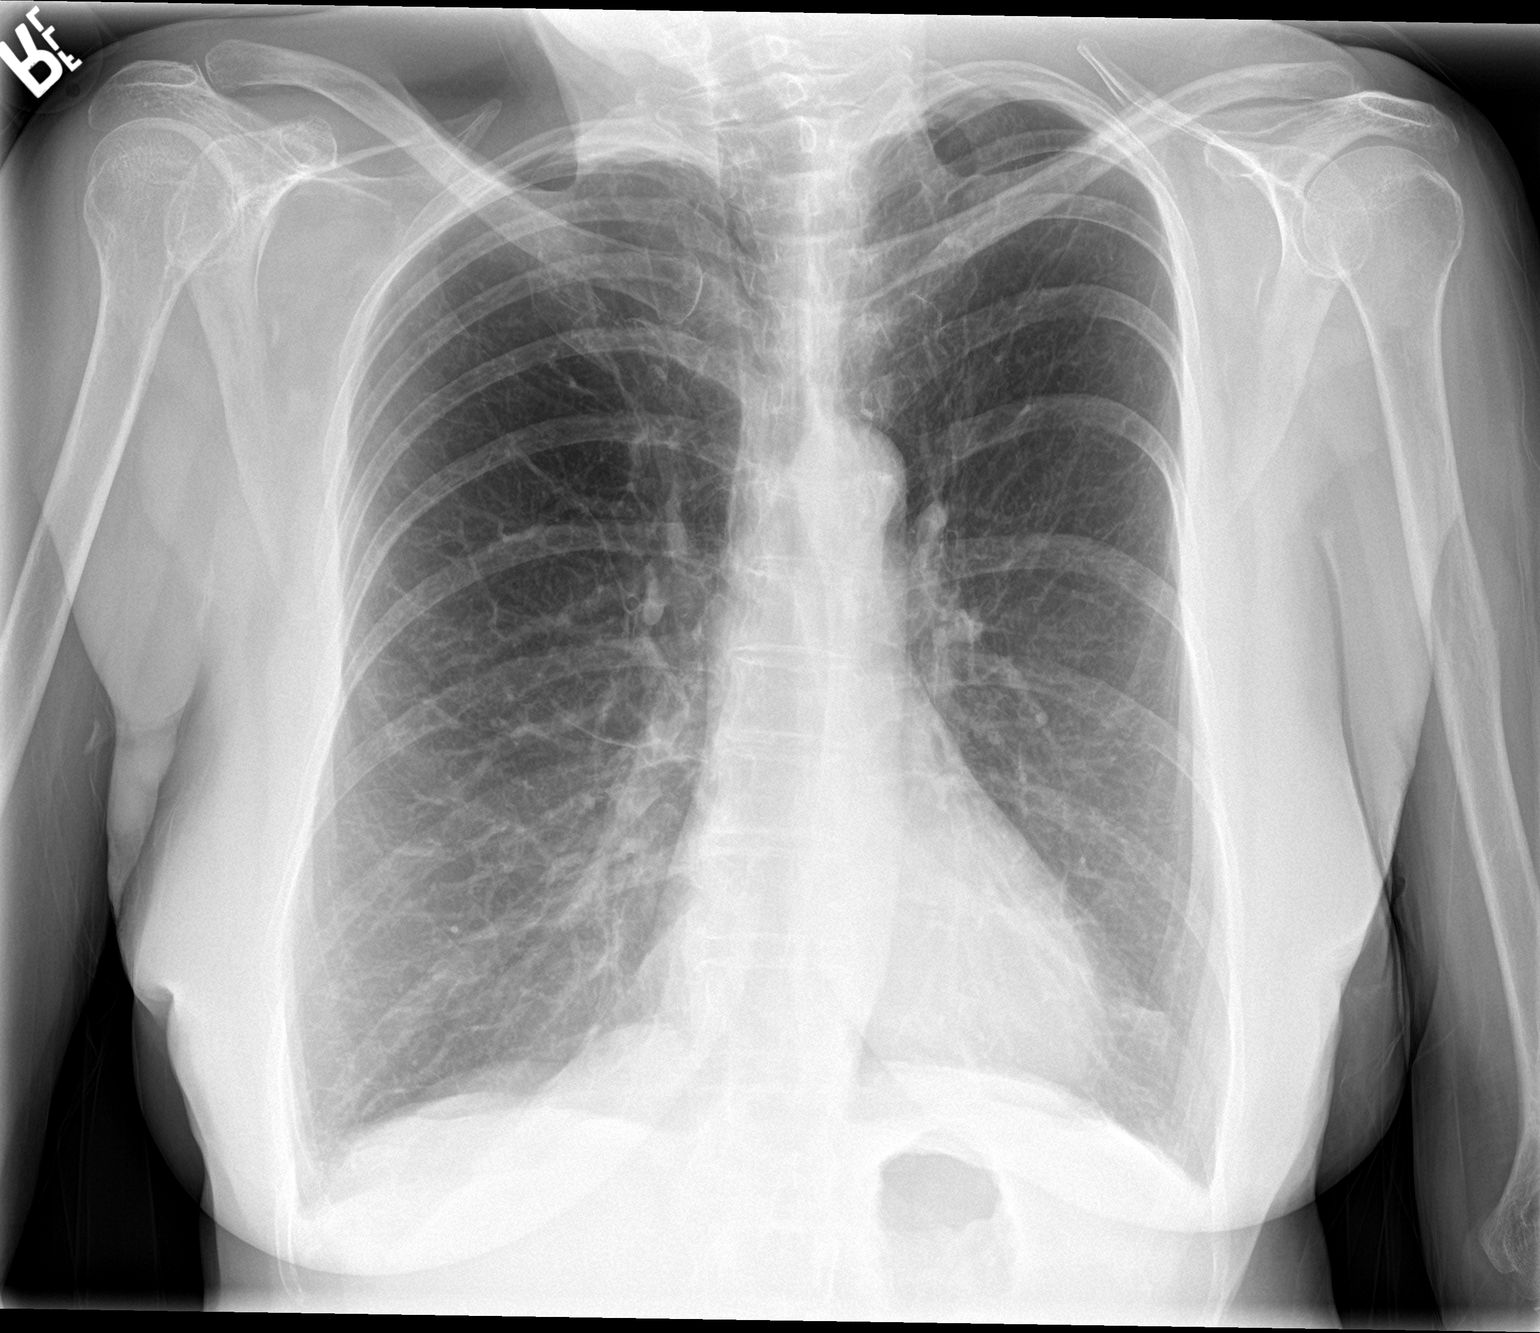

[chest lat]
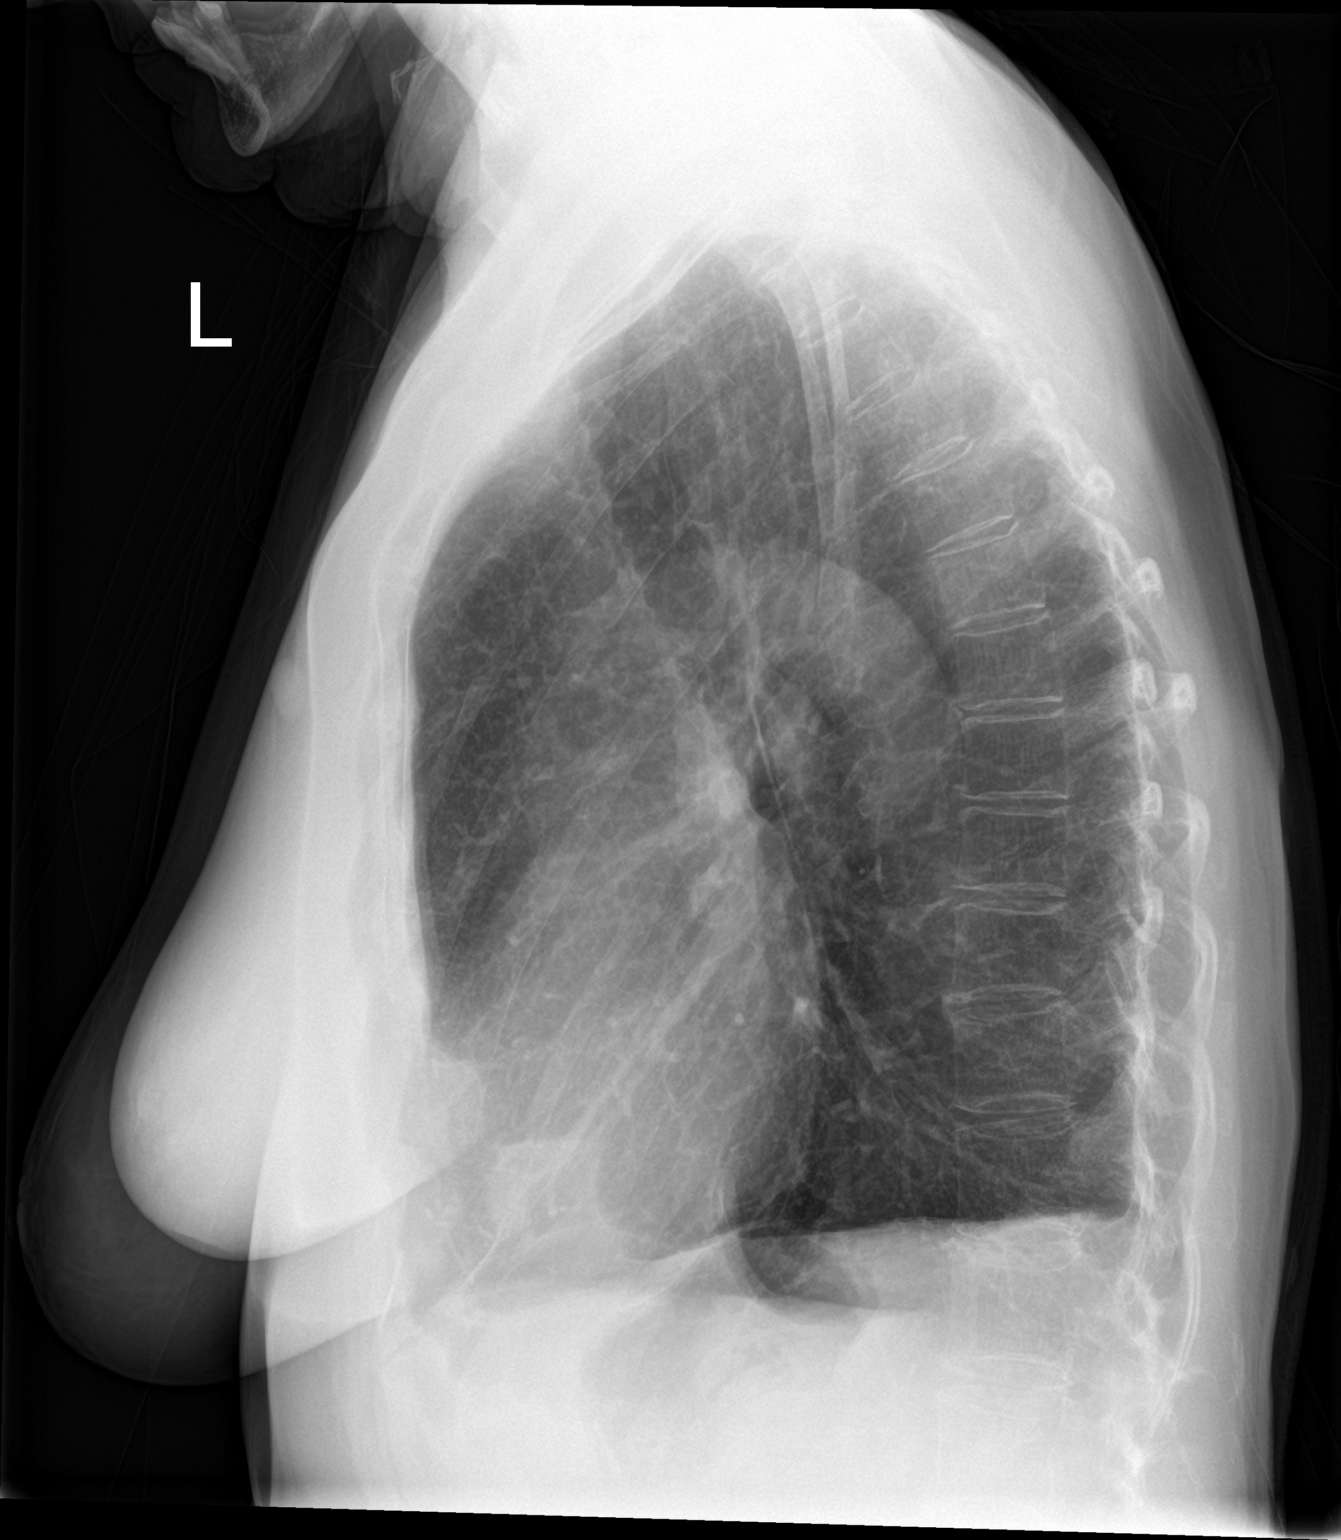

[2 of 2 positions shown; findings below may reference images not displayed]

FINDINGS: The cardiomediastinal silhouette is within normal limits. The lungs
remain hyperinflated. No airspace consolidation, edema, sizable
pleural effusion, or pneumothorax is identified. No acute osseous
abnormality is seen.
IMPRESSION: No active cardiopulmonary disease.

## 2018-03-27 ENCOUNTER — Ambulatory Visit (HOSPITAL_COMMUNITY)
Admission: RE | Admit: 2018-03-27 | Discharge: 2018-03-27 | Disposition: A | Discharge status: Home / Self Care | Payer: Medicare Other | Source: Ambulatory Visit | Attending: Internal Medicine | Admitting: Internal Medicine

## 2018-03-27 DIAGNOSIS — Z780 Asymptomatic menopausal state: Secondary | ICD-10-CM

## 2018-03-27 DIAGNOSIS — M810 Age-related osteoporosis without current pathological fracture: Secondary | ICD-10-CM | POA: Insufficient documentation

## 2018-04-03 DIAGNOSIS — M810 Age-related osteoporosis without current pathological fracture: Secondary | ICD-10-CM | POA: Diagnosis not present

## 2018-05-15 DIAGNOSIS — H5211 Myopia, right eye: Secondary | ICD-10-CM | POA: Diagnosis not present

## 2018-05-15 DIAGNOSIS — H52223 Regular astigmatism, bilateral: Secondary | ICD-10-CM | POA: Diagnosis not present

## 2018-05-15 DIAGNOSIS — H5202 Hypermetropia, left eye: Secondary | ICD-10-CM | POA: Diagnosis not present

## 2018-05-15 DIAGNOSIS — H43811 Vitreous degeneration, right eye: Secondary | ICD-10-CM | POA: Diagnosis not present

## 2018-06-07 DIAGNOSIS — J449 Chronic obstructive pulmonary disease, unspecified: Secondary | ICD-10-CM | POA: Diagnosis not present

## 2018-06-07 DIAGNOSIS — T50905A Adverse effect of unspecified drugs, medicaments and biological substances, initial encounter: Secondary | ICD-10-CM | POA: Diagnosis not present

## 2018-06-07 DIAGNOSIS — J9611 Chronic respiratory failure with hypoxia: Secondary | ICD-10-CM | POA: Diagnosis not present

## 2018-06-07 DIAGNOSIS — J301 Allergic rhinitis due to pollen: Secondary | ICD-10-CM | POA: Diagnosis not present

## 2018-06-19 ENCOUNTER — Encounter (INDEPENDENT_AMBULATORY_CARE_PROVIDER_SITE_OTHER): Payer: Self-pay

## 2018-06-24 ENCOUNTER — Ambulatory Visit (INDEPENDENT_AMBULATORY_CARE_PROVIDER_SITE_OTHER): Payer: Medicare Other | Admitting: Internal Medicine

## 2018-07-03 ENCOUNTER — Ambulatory Visit (INDEPENDENT_AMBULATORY_CARE_PROVIDER_SITE_OTHER): Payer: Medicare Other | Admitting: Internal Medicine

## 2018-08-01 ENCOUNTER — Ambulatory Visit (INDEPENDENT_AMBULATORY_CARE_PROVIDER_SITE_OTHER): Payer: Medicare Other | Admitting: Internal Medicine

## 2018-08-15 ENCOUNTER — Ambulatory Visit: Payer: Medicare Other | Admitting: "Endocrinology

## 2018-09-04 DIAGNOSIS — E0590 Thyrotoxicosis, unspecified without thyrotoxic crisis or storm: Secondary | ICD-10-CM | POA: Diagnosis not present

## 2018-09-04 DIAGNOSIS — K50919 Crohn's disease, unspecified, with unspecified complications: Secondary | ICD-10-CM | POA: Diagnosis not present

## 2018-09-04 DIAGNOSIS — I10 Essential (primary) hypertension: Secondary | ICD-10-CM | POA: Diagnosis not present

## 2018-09-04 DIAGNOSIS — J9611 Chronic respiratory failure with hypoxia: Secondary | ICD-10-CM | POA: Diagnosis not present

## 2018-09-04 DIAGNOSIS — E785 Hyperlipidemia, unspecified: Secondary | ICD-10-CM | POA: Diagnosis not present

## 2018-09-04 DIAGNOSIS — M810 Age-related osteoporosis without current pathological fracture: Secondary | ICD-10-CM | POA: Diagnosis not present

## 2018-09-04 DIAGNOSIS — R000 Tachycardia, unspecified: Secondary | ICD-10-CM | POA: Diagnosis not present

## 2018-09-04 DIAGNOSIS — J449 Chronic obstructive pulmonary disease, unspecified: Secondary | ICD-10-CM | POA: Diagnosis not present

## 2018-09-30 ENCOUNTER — Ambulatory Visit (INDEPENDENT_AMBULATORY_CARE_PROVIDER_SITE_OTHER): Payer: Medicare Other | Admitting: Internal Medicine

## 2018-10-03 ENCOUNTER — Ambulatory Visit (INDEPENDENT_AMBULATORY_CARE_PROVIDER_SITE_OTHER): Payer: Medicare Other | Admitting: Internal Medicine

## 2018-10-03 ENCOUNTER — Other Ambulatory Visit: Payer: Self-pay

## 2018-10-03 ENCOUNTER — Encounter (INDEPENDENT_AMBULATORY_CARE_PROVIDER_SITE_OTHER): Payer: Self-pay | Admitting: Internal Medicine

## 2018-10-03 DIAGNOSIS — K5010 Crohn's disease of large intestine without complications: Secondary | ICD-10-CM | POA: Diagnosis not present

## 2018-10-03 HISTORY — DX: Crohn's disease of large intestine without complications: K50.10

## 2018-10-03 NOTE — Patient Instructions (Signed)
OV in 6 months.

## 2018-10-03 NOTE — Progress Notes (Signed)
Subjective:    Patient ID: Stacey Nash, female    DOB: 09-02-1950, 68 y.o.   MRN: [id number]  HPI Start time 955. End time 10:13am total time 15 minutes.  Patient consent to this telephone OV. She is at home. I am in the office. Unable to do video OV. Telephone OV due to risk of COVID-19.  Follow up for Crohn's disease. She tells me she is doing good. She stopped the Apriso. She says the Apriso gave her diarrhea and nausea.  She stopped the Apriso back in July.  She only took for a few days. She says the water in her town that had bacteria in it. She buys bottled water. Does not drink water from the spicket. On average she is having 2-3 stools a day and she says her stools are normal. She denies any abdominal pain. She says her weight now is 125. She denies an rectal bleeding.  HPI Underwent a colonoscopy in May 2019 for diarrhea and rectal bleeding. Congested erythematous and eroded mucosa in ascending and sigmoid colon. Diverticulosis in sigmoid colon, One polyp in rectum.  Biopsy from proximal and distal colon revealed changes of Crohn's disease.   Review of Systems Past Medical History:  Diagnosis Date  . Allergy or intolerance to drug    Doxycycline caused worsening shortness of breath and loose stools.  . Asthma   . Chronic neck pain   . COPD (chronic obstructive pulmonary disease) ([place])   . Crohn's colitis ([place]) 10/03/2018  . Diverticula of intestine   . Hypertension   . Left adrenal mass ([place])   . Osteoporosis   . Tobacco abuse     Past Surgical History:  Procedure Laterality Date  . APPENDECTOMY     2013  . BIOPSY  10/01/2017   Procedure: BIOPSY;  Surgeon: Rogene Houston, MD;  [facility];  Service: Endoscopy;;  left and right colon  . CHOLECYSTECTOMY    . COLONOSCOPY WITH PROPOFOL N/A 10/01/2017   Procedure: COLONOSCOPY WITH PROPOFOL;  Surgeon: Rogene Houston, MD;  [facility];  Service: Endoscopy;  Laterality: N/A;  .  ESOPHAGOGASTRODUODENOSCOPY N/A 09/09/2014   Procedure: ESOPHAGOGASTRODUODENOSCOPY (EGD);  Surgeon: Rogene Houston, MD;  [facility];  Service: Endoscopy;  Laterality: N/A;  210 - moved to 9:10 - Ann to notify pt  . EYE SURGERY     cataract/implants  . LAPAROSCOPIC APPENDECTOMY  07/14/2011   Procedure: APPENDECTOMY LAPAROSCOPIC;  Surgeon: Donato Heinz, MD;  Location: [facility];  Service: General;  Laterality: N/A;  . POLYPECTOMY  10/01/2017   Procedure: POLYPECTOMY;  Surgeon: Rogene Houston, MD;  [facility];  Service: Endoscopy;;  rectal    Allergies  Allergen Reactions  . Sulfa Antibiotics Shortness Of Breath  . Tetanus Toxoids Rash  . Doxycycline Diarrhea  . Statins     Leg pain and weakness  . Zofran [Ondansetron Hcl] Hives and Rash    Rash at injection site that spread immediately following administration      Current Outpatient Medications on File Prior to Visit  Medication Sig Dispense Refill  . acetaminophen (TYLENOL) 500 MG tablet Take 500 mg by mouth daily as needed for moderate pain.    Marland Kitchen albuterol (PROVENTIL HFA;VENTOLIN HFA) 108 (90 BASE) MCG/ACT inhaler Inhale 2 puffs into the lungs every 6 (six) hours as needed for wheezing or shortness of breath.     Marland Kitchen albuterol (PROVENTIL) (2.5 MG/3ML) 0.083% nebulizer solution Take 2.5  mg by nebulization every 6 (six) hours as needed for wheezing or shortness of breath.    Marland Kitchen aluminum hydroxide-magnesium carbonate (GAVISCON) 95-358 MG/15ML SUSP Take 15 mLs by mouth as needed for indigestion or heartburn.    Marland Kitchen amLODipine (NORVASC) 5 MG tablet Take 1 tablet (5 mg total) by mouth daily. 30 tablet 6  . Calcium Carb-Cholecalciferol (CALCIUM 600 + D PO) Take 1 tablet by mouth 2 (two) times daily.    . Cholecalciferol (VITAMIN D3) 1.25 MG (50000 UT) CAPS Take 2,000 Units by mouth.    . loperamide (IMODIUM) 2 MG capsule Take 1 capsule (2 mg total) by mouth 4 (four) times daily as needed for diarrhea or loose stools.  (Patient taking differently: Take 2 mg by mouth as needed for diarrhea or loose stools. ) 12 capsule 0  . Menthol, Topical Analgesic, (ICY HOT EX) Apply 1 application topically daily as needed (pain).    . Multiple Vitamins-Minerals (CENTRUM SILVER ULTRA WOMENS PO) Take by mouth daily.    . Omega-3 Fatty Acids (FISH OIL) 1000 MG CAPS Take 1,000 mg by mouth 2 (two) times daily.     . propranolol (INDERAL) 10 MG tablet TAKE 1 TABLET BY MOUTH TWICE A DAY (Patient taking differently: 1/2 tab at night) 180 tablet 1  . tiotropium (SPIRIVA HANDIHALER) 18 MCG inhalation capsule Place 18 mcg into inhaler and inhale every evening.      No current facility-administered medications on file prior to visit.         Objective:   Physical Exam  Deferred.       Assessment & Plan:  Crohn's disease. She is not maintained on any medication at this time. She will have CBC and CRP  In a couple of weeks due to the COVID-19 virus.

## 2018-10-24 ENCOUNTER — Ambulatory Visit: Payer: Medicare Other | Admitting: "Endocrinology

## 2018-11-14 ENCOUNTER — Other Ambulatory Visit (HOSPITAL_COMMUNITY): Payer: Self-pay | Admitting: Internal Medicine

## 2018-11-14 DIAGNOSIS — Z1231 Encounter for screening mammogram for malignant neoplasm of breast: Secondary | ICD-10-CM

## 2018-12-06 DIAGNOSIS — J301 Allergic rhinitis due to pollen: Secondary | ICD-10-CM | POA: Diagnosis not present

## 2018-12-06 DIAGNOSIS — J449 Chronic obstructive pulmonary disease, unspecified: Secondary | ICD-10-CM | POA: Diagnosis not present

## 2018-12-06 DIAGNOSIS — J9611 Chronic respiratory failure with hypoxia: Secondary | ICD-10-CM | POA: Diagnosis not present

## 2018-12-06 DIAGNOSIS — I499 Cardiac arrhythmia, unspecified: Secondary | ICD-10-CM | POA: Diagnosis not present

## 2018-12-25 ENCOUNTER — Other Ambulatory Visit: Payer: Self-pay | Admitting: "Endocrinology

## 2019-01-06 ENCOUNTER — Encounter (INDEPENDENT_AMBULATORY_CARE_PROVIDER_SITE_OTHER): Payer: Self-pay

## 2019-01-08 ENCOUNTER — Ambulatory Visit (HOSPITAL_COMMUNITY)
Admission: RE | Admit: 2019-01-08 | Discharge: 2019-01-08 | Disposition: A | Discharge status: Home / Self Care | Payer: Medicare Other | Source: Ambulatory Visit | Attending: Internal Medicine | Admitting: Internal Medicine

## 2019-01-08 ENCOUNTER — Other Ambulatory Visit: Payer: Self-pay

## 2019-01-08 ENCOUNTER — Other Ambulatory Visit: Payer: Self-pay | Admitting: "Endocrinology

## 2019-01-08 DIAGNOSIS — Z1231 Encounter for screening mammogram for malignant neoplasm of breast: Secondary | ICD-10-CM | POA: Insufficient documentation

## 2019-01-08 DIAGNOSIS — E0590 Thyrotoxicosis, unspecified without thyrotoxic crisis or storm: Secondary | ICD-10-CM

## 2019-01-08 DIAGNOSIS — E278 Other specified disorders of adrenal gland: Secondary | ICD-10-CM

## 2019-01-08 DIAGNOSIS — K5010 Crohn's disease of large intestine without complications: Secondary | ICD-10-CM | POA: Diagnosis not present

## 2019-01-09 LAB — CBC WITH DIFFERENTIAL/PLATELET
Absolute Monocytes: 600 cells/uL (ref 200–950)
Basophils Absolute: 79 cells/uL (ref 0–200)
Basophils Relative: 1 %
Eosinophils Absolute: 142 cells/uL (ref 15–500)
Eosinophils Relative: 1.8 %
HCT: 40.8 % (ref 35.0–45.0)
Hemoglobin: 14 g/dL (ref 11.7–15.5)
Lymphs Abs: 1864 cells/uL (ref 850–3900)
MCH: 29.5 pg (ref 27.0–33.0)
MCHC: 34.3 g/dL (ref 32.0–36.0)
MCV: 85.9 fL (ref 80.0–100.0)
MPV: 9.3 fL (ref 7.5–12.5)
Monocytes Relative: 7.6 %
Neutro Abs: 5214 cells/uL (ref 1500–7800)
Neutrophils Relative %: 66 %
Platelets: 415 10*3/uL — ABNORMAL HIGH (ref 140–400)
RBC: 4.75 10*6/uL (ref 3.80–5.10)
RDW: 12.9 % (ref 11.0–15.0)
Total Lymphocyte: 23.6 %
WBC: 7.9 10*3/uL (ref 3.8–10.8)

## 2019-01-09 LAB — T4, FREE: Free T4: 1.2 ng/dL (ref 0.8–1.8)

## 2019-01-09 LAB — CORTISOL-AM, BLOOD: Cortisol - AM: 15.4 ug/dL

## 2019-01-09 LAB — TSH: TSH: 0.75 mIU/L (ref 0.40–4.50)

## 2019-01-09 LAB — C-REACTIVE PROTEIN: CRP: 3.7 mg/L (ref <8.0)

## 2019-01-20 ENCOUNTER — Encounter: Payer: Self-pay | Admitting: "Endocrinology

## 2019-01-20 ENCOUNTER — Ambulatory Visit (INDEPENDENT_AMBULATORY_CARE_PROVIDER_SITE_OTHER): Payer: Medicare Other | Admitting: "Endocrinology

## 2019-01-20 ENCOUNTER — Other Ambulatory Visit: Payer: Self-pay

## 2019-01-20 DIAGNOSIS — E0590 Thyrotoxicosis, unspecified without thyrotoxic crisis or storm: Secondary | ICD-10-CM

## 2019-01-20 NOTE — Progress Notes (Signed)
01/20/2019                                Endocrinology Telehealth Visit Follow up Note -During COVID -19 Pandemic  I connected with Stacey Nash on 01/20/2019   by telephone and verified that I am speaking with the correct person using two identifiers. NEVENA ROZENBERG, [birth year]. she has verbally consented to this visit. All issues noted in this document were discussed and addressed. The format was not optimal for physical exam.  Subjective:    Patient ID: Stacey Nash, female    DOB: 1950-11-25, PCP Celene Squibb, MD.   Past Medical History:  Diagnosis Date  . Allergy or intolerance to drug    Doxycycline caused worsening shortness of breath and loose stools.  . Asthma   . Chronic neck pain   . COPD (chronic obstructive pulmonary disease) ([place])   . Crohn's colitis ([place]) 10/03/2018  . Diverticula of intestine   . Hypertension   . Left adrenal mass ([place])   . Osteoporosis   . Tobacco abuse    Past Surgical History:  Procedure Laterality Date  . APPENDECTOMY     2013  . BIOPSY  10/01/2017   Procedure: BIOPSY;  Surgeon: Rogene Houston, MD;  [facility];  Service: Endoscopy;;  left and right colon  . CHOLECYSTECTOMY    . COLONOSCOPY WITH PROPOFOL N/A 10/01/2017   Procedure: COLONOSCOPY WITH PROPOFOL;  Surgeon: Rogene Houston, MD;  Location: [facility];  Service: Endoscopy;  Laterality: N/A;  . ESOPHAGOGASTRODUODENOSCOPY N/A 09/09/2014   Procedure: ESOPHAGOGASTRODUODENOSCOPY (EGD);  Surgeon: Rogene Houston, MD;  Location: [facility];  Service: Endoscopy;  Laterality: N/A;  210 - moved to 9:10 - Ann to notify pt  . EYE SURGERY     cataract/implants  . LAPAROSCOPIC APPENDECTOMY  07/14/2011   Procedure: APPENDECTOMY LAPAROSCOPIC;  Surgeon: Donato Heinz, MD;  Location: [facility];  Service: General;  Laterality: N/A;  . POLYPECTOMY  10/01/2017   Procedure: POLYPECTOMY;  Surgeon: Rogene Houston, MD;  [facility];  Service: Endoscopy;;  rectal    Social History   Socioeconomic History  . Marital status: Widowed    Spouse name: Not on file  . Number of children: Not on file  . Years of education: Not on file  . Highest education level: Not on file  Occupational History  . Not on file  Social Needs  . Financial resource strain: Not on file  . Food insecurity    Worry: Not on file    Inability: Not on file  . Transportation needs    Medical: Not on file    Non-medical: Not on file  Tobacco Use  . Smoking status: Former Smoker    Packs/day: 2.00    Years: 50.00    Pack years: 100.00    Types: Cigarettes    Quit date: 09/29/2013    Years since quitting: 5.3  . Smokeless tobacco: Former Systems developer    Quit date: 09/05/2013  . Tobacco comment: quit 4 yrs ago.   Substance and Sexual Activity  . Alcohol use: No    Alcohol/week: 0.0 standard drinks  . Drug use: No  . Sexual activity: Not Currently  Lifestyle  . Physical activity    Days per week: Not on file    Minutes per session: Not on file  . Stress: Not on file  Relationships  .  Social Herbalist on phone: Not on file    Gets together: Not on file    Attends religious service: Not on file    Active member of club or organization: Not on file    Attends meetings of clubs or organizations: Not on file    Relationship status: Not on file  Other Topics Concern  . Not on file  Social History Narrative   Retired from book keeping and taxes.   Single, divorced.   3 boys.    Enjoys puzzles, reading, shopping.   Does not attend church, but watches tv church.   Does not eat red meat. Does not eat eggs or drink dairy.   Drives.   Wear seatbelt.    Youngest son lives with her.    Has grandchildren.    Outpatient Encounter Medications as of 01/20/2019  Medication Sig  . acetaminophen (TYLENOL) 500 MG tablet Take 500 mg by mouth daily as needed for moderate pain.  Marland Kitchen albuterol (PROVENTIL HFA;VENTOLIN HFA) 108 (90 BASE) MCG/ACT inhaler Inhale 2 puffs into the  lungs every 6 (six) hours as needed for wheezing or shortness of breath.   Marland Kitchen albuterol (PROVENTIL) (2.5 MG/3ML) 0.083% nebulizer solution Take 2.5 mg by nebulization every 6 (six) hours as needed for wheezing or shortness of breath.  Marland Kitchen aluminum hydroxide-magnesium carbonate (GAVISCON) 95-358 MG/15ML SUSP Take 15 mLs by mouth as needed for indigestion or heartburn.  Marland Kitchen amLODipine (NORVASC) 5 MG tablet Take 1 tablet (5 mg total) by mouth daily.  . Calcium Carb-Cholecalciferol (CALCIUM 600 + D PO) Take 1 tablet by mouth 2 (two) times daily.  . Cholecalciferol (VITAMIN D3) 1.25 MG (50000 UT) CAPS Take 2,000 Units by mouth.  . loperamide (IMODIUM) 2 MG capsule Take 1 capsule (2 mg total) by mouth 4 (four) times daily as needed for diarrhea or loose stools. (Patient taking differently: Take 2 mg by mouth as needed for diarrhea or loose stools. )  . Menthol, Topical Analgesic, (ICY HOT EX) Apply 1 application topically daily as needed (pain).  . Multiple Vitamins-Minerals (CENTRUM SILVER ULTRA WOMENS PO) Take by mouth daily.  . Omega-3 Fatty Acids (FISH OIL) 1000 MG CAPS Take 1,000 mg by mouth 2 (two) times daily.   . propranolol (INDERAL) 10 MG tablet TAKE 1 TABLET BY MOUTH TWICE A DAY (Patient taking differently: 1/2 tab at night)  . tiotropium (SPIRIVA HANDIHALER) 18 MCG inhalation capsule Place 18 mcg into inhaler and inhale every evening.    No facility-administered encounter medications on file as of 01/20/2019.     ALLERGIES: Allergies  Allergen Reactions  . Sulfa Antibiotics Shortness Of Breath  . Tetanus Toxoids Rash  . Doxycycline Diarrhea  . Statins     Leg pain and weakness  . Zofran [Ondansetron Hcl] Hives and Rash    Rash at injection site that spread immediately following administration      VACCINATION STATUS: Immunization History  Administered Date(s) Administered  . Td 10/09/2017     HPI  MARIE BOROWSKI is 68 y.o. female who is returning for follow-up with thyroid  function test.   -She was previously found to have slightly above target thyroid hormone level, currently not on any antithyroid treatment.  Her previsit thyroid function tests remain within normal limits. -She is currently on low-dose propranolol 10 mg p.o. daily for nonspecific symptoms given her history of subclinical hyperthyroidism.   She reports better energy, gained 9 more pounds since last visit. -She has no  new complaints today.  She denies tremors, palpitations.  . she denies dysphagia, choking, shortness of breath, no recent voice change.   she reports family history of thyroid dysfunction in her mother and 1 of her grandparents who did have unidentified thyroid dysfunction.  She denies family history of thyroid cancer.  - she denies personal history of goiter, has significant past history of smoking with COPD currently on portable oxygen supplement. she is not on any anti-thyroid medications nor on any thyroid hormone supplements. 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                           Review of systems - Limited as above.  Objective:    There were no vitals taken for this visit.  Wt Readings from Last 3 Encounters:  02/14/18 121 lb (54.9 kg)  12/19/17 117 lb 14.4 oz (53.5 kg)  11/13/17 114 lb (51.7 kg)                                         CMP     Component Value Date/Time   NA 137 09/24/2017 1050   K 4.0 09/24/2017 1050   CL 101 09/24/2017 1050   CO2 26 09/24/2017 1050   GLUCOSE 108 (H) 09/24/2017 1050   BUN 9 09/24/2017 1050   CREATININE 0.69 09/24/2017 1050   CALCIUM 10.0 09/24/2017 1050   PROT 7.4 09/24/2017 1050   ALBUMIN 4.8 09/21/2016 1538   AST 16 09/24/2017 1050   ALT 11 09/24/2017 1050   ALKPHOS 75 09/21/2016 1538   BILITOT 1.4 (H) 09/24/2017 1050   GFRNONAA 89 09/24/2017 1050   GFRAA 104 09/24/2017 1050     CBC    Component Value Date/Time   WBC 7.9 01/08/2019 0759   RBC 4.75 01/08/2019 0759    HGB 14.0 01/08/2019 0759   HCT 40.8 01/08/2019 0759   PLT 415 (H) 01/08/2019 0759   MCV 85.9 01/08/2019 0759   MCH 29.5 01/08/2019 0759   MCHC 34.3 01/08/2019 0759   RDW 12.9 01/08/2019 0759   LYMPHSABS 1,864 01/08/2019 0759   MONOABS 0.6 03/07/2016 0430   EOSABS 142 01/08/2019 0759   BASOSABS 79 01/08/2019 0759     Diabetic Labs (most recent): Lab Results  Component Value Date   HGBA1C 5.7 (H) 10/09/2017    Lipid Panel     Component Value Date/Time   CHOL 279 (H) 09/24/2017 1050   TRIG 229 (H) 09/24/2017 1050   HDL 45 (L) 09/24/2017 1050   CHOLHDL 6.2 (H) 09/24/2017 1050   LDLCALC 192 (H) 09/24/2017 1050     Lab Results  Component Value Date   TSH 0.75 01/08/2019   TSH 0.99 02/06/2018   TSH 0.27 (L) 10/09/2017   TSH 0.36 (L) 09/24/2017   TSH 0.179 (L) 04/15/2013   FREET4 1.2 01/08/2019   FREET4 1.1 02/06/2018       Assessment & Plan:   1. Hyperthyroidism -Her previsit labs are consistent with normal thyroid function.     -Her prior thyroid uptake and scan is normal or unremarkable.  She will not require ablative thyroid treatment.    -She has benefited from low-dose beta-blocker treatment.  She will continue on propranolol 10 mg p.o. once a day.  -She reports that she cannot tolerate any higher dose of amlodipine  to treat her blood pressure.  She wishes to stay on amlodipine 5 mg p.o.  daily.  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  - I advised her to maintain close follow up with Celene Squibb, MD for primary care needs.  Time for this visit: 15 minutes. MORINE KOHLMAN  participated in the discussions, expressed understanding, and voiced agreement with the above plans.  All questions were answered to her satisfaction. she is encouraged to contact clinic should she have any questions or concerns prior to her return visit.  Follow up plan: Return in about 6 months (around 07/23/2019) for Follow up with Pre-visit  Labs.   Thank you for involving me in the care of this pleasant patient, and I will continue to update you with her progress.  Glade Lloyd, MD [facility] Phone: [phone]  Fax: [phone]   01/20/2019, 4:14 PM  This note was partially dictated with voice recognition software. Similar sounding words can be transcribed inadequately or may not  be corrected upon review.

## 2019-03-17 ENCOUNTER — Encounter (INDEPENDENT_AMBULATORY_CARE_PROVIDER_SITE_OTHER): Payer: Self-pay

## 2019-04-07 ENCOUNTER — Ambulatory Visit (INDEPENDENT_AMBULATORY_CARE_PROVIDER_SITE_OTHER): Payer: Medicare Other | Admitting: Nurse Practitioner

## 2019-04-14 DIAGNOSIS — E785 Hyperlipidemia, unspecified: Secondary | ICD-10-CM | POA: Diagnosis not present

## 2019-04-14 DIAGNOSIS — E559 Vitamin D deficiency, unspecified: Secondary | ICD-10-CM | POA: Diagnosis not present

## 2019-04-14 DIAGNOSIS — E0590 Thyrotoxicosis, unspecified without thyrotoxic crisis or storm: Secondary | ICD-10-CM | POA: Diagnosis not present

## 2019-04-14 DIAGNOSIS — M810 Age-related osteoporosis without current pathological fracture: Secondary | ICD-10-CM | POA: Diagnosis not present

## 2019-04-14 DIAGNOSIS — I10 Essential (primary) hypertension: Secondary | ICD-10-CM | POA: Diagnosis not present

## 2019-04-14 DIAGNOSIS — E782 Mixed hyperlipidemia: Secondary | ICD-10-CM | POA: Diagnosis not present

## 2019-04-14 DIAGNOSIS — R7301 Impaired fasting glucose: Secondary | ICD-10-CM | POA: Diagnosis not present

## 2019-04-14 DIAGNOSIS — R1030 Lower abdominal pain, unspecified: Secondary | ICD-10-CM | POA: Diagnosis not present

## 2019-04-21 DIAGNOSIS — Z6822 Body mass index (BMI) 22.0-22.9, adult: Secondary | ICD-10-CM | POA: Diagnosis not present

## 2019-04-21 DIAGNOSIS — I10 Essential (primary) hypertension: Secondary | ICD-10-CM | POA: Diagnosis not present

## 2019-04-21 DIAGNOSIS — R000 Tachycardia, unspecified: Secondary | ICD-10-CM | POA: Diagnosis not present

## 2019-04-21 DIAGNOSIS — K5090 Crohn's disease, unspecified, without complications: Secondary | ICD-10-CM | POA: Diagnosis not present

## 2019-04-21 DIAGNOSIS — M25531 Pain in right wrist: Secondary | ICD-10-CM | POA: Diagnosis not present

## 2019-04-21 DIAGNOSIS — J449 Chronic obstructive pulmonary disease, unspecified: Secondary | ICD-10-CM | POA: Diagnosis not present

## 2019-04-21 DIAGNOSIS — E0789 Other specified disorders of thyroid: Secondary | ICD-10-CM | POA: Diagnosis not present

## 2019-04-21 DIAGNOSIS — Z0000 Encounter for general adult medical examination without abnormal findings: Secondary | ICD-10-CM | POA: Diagnosis not present

## 2019-04-21 DIAGNOSIS — E782 Mixed hyperlipidemia: Secondary | ICD-10-CM | POA: Diagnosis not present

## 2019-04-21 DIAGNOSIS — R7301 Impaired fasting glucose: Secondary | ICD-10-CM | POA: Diagnosis not present

## 2019-04-21 DIAGNOSIS — E559 Vitamin D deficiency, unspecified: Secondary | ICD-10-CM | POA: Diagnosis not present

## 2019-04-21 DIAGNOSIS — M810 Age-related osteoporosis without current pathological fracture: Secondary | ICD-10-CM | POA: Diagnosis not present

## 2019-04-28 ENCOUNTER — Ambulatory Visit: Payer: Medicare Other | Admitting: "Endocrinology

## 2019-04-29 ENCOUNTER — Encounter (INDEPENDENT_AMBULATORY_CARE_PROVIDER_SITE_OTHER): Payer: Self-pay | Admitting: Internal Medicine

## 2019-04-29 ENCOUNTER — Other Ambulatory Visit: Payer: Self-pay

## 2019-04-29 ENCOUNTER — Ambulatory Visit (INDEPENDENT_AMBULATORY_CARE_PROVIDER_SITE_OTHER): Payer: Medicare Other | Admitting: Internal Medicine

## 2019-04-29 VITALS — BP 119/71 | HR 80 | Ht 62.0 in | Wt 125.0 lb

## 2019-04-29 DIAGNOSIS — K5010 Crohn's disease of large intestine without complications: Secondary | ICD-10-CM | POA: Diagnosis not present

## 2019-04-29 NOTE — Progress Notes (Signed)
Virtual Visit via Telephone Note  Patient had face-to-face visit scheduled for today.  It was decided to change her visit to telephone/virtual visit because of ongoing Covid-19 pandemic and she agreed. I connected with Stacey Nash on 04/29/19 at 10:15 AM EST by telephone and verified that I am speaking with the correct person using two identifiers.  Location: Patient: home Provider: office   I discussed the limitations, risks, security and privacy concerns of performing an evaluation and management service by telephone and the availability of in person appointments. I also discussed with the patient that there may be a patient responsible charge related to this service. The patient expressed understanding and agreed to proceed.   History of Present Illness:  Patient is 68 year old Caucasian female who presented in May 2019 with over 4-monthhistory of diarrhea and heme positive stool.  She reported having as many as 12-13 stools per day.  She was out of also having lower abdominal cramping.  GI pathogen panel was negative.  Colonoscopy revealed normal terminal ileum focal colitis involving ascending and sigmoid colon.  She also has sigmoid colon diverticulosis and small rectal polyp which turned out to be tubular adenoma.  Biopsy from ascending colon as well as sigmoid colon revealed changes of chronic colitis most consistent with Crohn's disease. She was felt to have Crohn's colitis with bowel symptoms and begun on Apriso.  Patient was seen in the office 2 months later and reported worsening diarrhea with Apriso which she stopped.  She reported having normal bowel movements.  Patient feels convinced that her diarrhea and colitis was due to drinking contaminated water.  This is not an issue anymore.  She says she has 1-3 formed stools daily.  She has occasional diarrhea.  She may have taken an Imodium once in the last few months.  She denies melena or rectal bleeding or abdominal pain.  Her  appetite is good.  She has gained 10 pounds in the last year and a half. She reports that she was treated for diverticulitis about 2 weeks ago.  She finished antibiotics 1 week ago and abdominal pain has resolved. Patient states Synthroid dose was decreased today.  Her TSH was low.   Observations/Objective:  Patient reported her weight to be 125 pounds. She weighed 115 pounds in April 2019.  Assessment and Plan:  Crohn's colitis.  This condition was diagnosed in May 2019 via colonoscopy.  GI pathogen panel was negative.  She did not tolerate oral mesalamine.  Presently she appears to be in remission.  She still could have mild disease and if this is the case she needs to be on some therapy.  She would benefit from checking fecal calprotectin.  Patient's medication list updated.  Follow Up Instructions:  Patient will go to the lab for fecal calprotectin. Further recommendations to follow.  Office visit in 1 year. I discussed the assessment and treatment plan with the patient. The patient was provided an opportunity to ask questions and all were answered. The patient agreed with the plan and demonstrated an understanding of the instructions.   The patient was advised to call back or seek an in-person evaluation if the symptoms worsen or if the condition fails to improve as anticipated.  I provided 13 minutes of non-face-to-face time during this encounter.   NHildred Laser MD

## 2019-04-29 NOTE — Patient Instructions (Signed)
Physician will call with results of stool test when completed.

## 2019-05-02 ENCOUNTER — Other Ambulatory Visit (INDEPENDENT_AMBULATORY_CARE_PROVIDER_SITE_OTHER): Payer: Self-pay | Admitting: *Deleted

## 2019-05-02 DIAGNOSIS — K5010 Crohn's disease of large intestine without complications: Secondary | ICD-10-CM

## 2019-05-02 DIAGNOSIS — R197 Diarrhea, unspecified: Secondary | ICD-10-CM

## 2019-06-04 LAB — CALPROTECTIN, FECAL: Calprotectin, Fecal: 35 ug/g (ref 0–120)

## 2019-06-30 DIAGNOSIS — R1030 Lower abdominal pain, unspecified: Secondary | ICD-10-CM | POA: Diagnosis not present

## 2019-07-07 DIAGNOSIS — J449 Chronic obstructive pulmonary disease, unspecified: Secondary | ICD-10-CM | POA: Diagnosis not present

## 2019-07-07 DIAGNOSIS — B379 Candidiasis, unspecified: Secondary | ICD-10-CM | POA: Diagnosis not present

## 2019-07-07 DIAGNOSIS — J9611 Chronic respiratory failure with hypoxia: Secondary | ICD-10-CM | POA: Diagnosis not present

## 2019-07-22 ENCOUNTER — Telehealth: Payer: Self-pay | Admitting: "Endocrinology

## 2019-07-22 NOTE — Telephone Encounter (Signed)
Can you update lab order

## 2019-07-23 ENCOUNTER — Other Ambulatory Visit: Payer: Self-pay

## 2019-07-23 DIAGNOSIS — E0590 Thyrotoxicosis, unspecified without thyrotoxic crisis or storm: Secondary | ICD-10-CM

## 2019-07-23 NOTE — Telephone Encounter (Signed)
Updated and sent orders to [facility].

## 2019-07-28 ENCOUNTER — Ambulatory Visit: Payer: Medicare Other | Admitting: "Endocrinology

## 2019-08-05 DIAGNOSIS — R1011 Right upper quadrant pain: Secondary | ICD-10-CM | POA: Diagnosis not present

## 2019-08-05 DIAGNOSIS — C787 Secondary malignant neoplasm of liver and intrahepatic bile duct: Secondary | ICD-10-CM | POA: Diagnosis not present

## 2019-08-05 DIAGNOSIS — E279 Disorder of adrenal gland, unspecified: Secondary | ICD-10-CM | POA: Diagnosis not present

## 2019-08-05 DIAGNOSIS — R1084 Generalized abdominal pain: Secondary | ICD-10-CM | POA: Diagnosis not present

## 2019-08-05 DIAGNOSIS — Z882 Allergy status to sulfonamides status: Secondary | ICD-10-CM | POA: Diagnosis not present

## 2019-08-05 DIAGNOSIS — Z539 Procedure and treatment not carried out, unspecified reason: Secondary | ICD-10-CM | POA: Diagnosis not present

## 2019-08-05 DIAGNOSIS — Z881 Allergy status to other antibiotic agents status: Secondary | ICD-10-CM | POA: Diagnosis not present

## 2019-08-05 DIAGNOSIS — Z888 Allergy status to other drugs, medicaments and biological substances status: Secondary | ICD-10-CM | POA: Diagnosis not present

## 2019-08-05 DIAGNOSIS — R52 Pain, unspecified: Secondary | ICD-10-CM | POA: Diagnosis not present

## 2019-08-05 DIAGNOSIS — I10 Essential (primary) hypertension: Secondary | ICD-10-CM | POA: Diagnosis not present

## 2019-08-06 DIAGNOSIS — R110 Nausea: Secondary | ICD-10-CM | POA: Diagnosis not present

## 2019-08-06 DIAGNOSIS — K5090 Crohn's disease, unspecified, without complications: Secondary | ICD-10-CM | POA: Diagnosis not present

## 2019-08-06 DIAGNOSIS — J449 Chronic obstructive pulmonary disease, unspecified: Secondary | ICD-10-CM | POA: Diagnosis not present

## 2019-08-06 DIAGNOSIS — R932 Abnormal findings on diagnostic imaging of liver and biliary tract: Secondary | ICD-10-CM | POA: Diagnosis not present

## 2019-08-06 DIAGNOSIS — R1010 Upper abdominal pain, unspecified: Secondary | ICD-10-CM | POA: Diagnosis not present

## 2019-08-06 DIAGNOSIS — E43 Unspecified severe protein-calorie malnutrition: Secondary | ICD-10-CM | POA: Diagnosis not present

## 2019-08-06 DIAGNOSIS — R634 Abnormal weight loss: Secondary | ICD-10-CM | POA: Diagnosis not present

## 2019-08-07 ENCOUNTER — Other Ambulatory Visit (HOSPITAL_COMMUNITY): Payer: Self-pay | Admitting: Internal Medicine

## 2019-08-07 DIAGNOSIS — Z1289 Encounter for screening for malignant neoplasm of other sites: Secondary | ICD-10-CM

## 2019-08-15 ENCOUNTER — Encounter (HOSPITAL_COMMUNITY): Payer: Self-pay | Admitting: Radiology

## 2019-08-15 NOTE — Progress Notes (Signed)
Stacey Nash. Stacey Nash Female, 69 y.o., 06-20-1950 MRN:  [id number] Phone:  [phone] (H) ... PCP:  Celene Squibb, MD Primary Cvg:  Medicare/Medicare Part A And B Next Appt With Radiology ([facility]-US 2) 08/22/2019 at 1:00 PM  RE: US Liver Biopsy Received: Today Message Contents  Arne Cleveland, MD  Arlyn Leak   [place] Core liver lesion   [facility]       Previous Messages   ----- Message -----  From: Garth Bigness D  Sent: 08/15/2019 10:27 AM EDT  To: [facility] Procedure Requests  Subject: US Liver Biopsy                  Procedure:  US Liver Biopsy   Reason:  Encounter for screening for malignant neoplasm of other sites   History:  Outside CT in PAC's sent from [facility]. Name images are under that came over from [facility] is Stacey Nash 11/29/1950   Provider:  [name]   Provider Contact: [phone]

## 2019-08-19 ENCOUNTER — Emergency Department (HOSPITAL_COMMUNITY): Payer: Medicare Other

## 2019-08-19 ENCOUNTER — Inpatient Hospital Stay (HOSPITAL_COMMUNITY)
Admission: EM | Admit: 2019-08-19 | Discharge: 2019-08-21 | DRG: 180 | Disposition: A | Discharge status: Home / Self Care | Payer: Medicare Other | Source: Ambulatory Visit | Attending: Internal Medicine | Admitting: Internal Medicine

## 2019-08-19 ENCOUNTER — Other Ambulatory Visit: Payer: Self-pay

## 2019-08-19 ENCOUNTER — Encounter (HOSPITAL_COMMUNITY): Payer: Self-pay | Admitting: Emergency Medicine

## 2019-08-19 DIAGNOSIS — E860 Dehydration: Secondary | ICD-10-CM | POA: Diagnosis present

## 2019-08-19 DIAGNOSIS — J449 Chronic obstructive pulmonary disease, unspecified: Secondary | ICD-10-CM

## 2019-08-19 DIAGNOSIS — G8929 Other chronic pain: Secondary | ICD-10-CM | POA: Diagnosis present

## 2019-08-19 DIAGNOSIS — Z20822 Contact with and (suspected) exposure to covid-19: Secondary | ICD-10-CM | POA: Diagnosis present

## 2019-08-19 DIAGNOSIS — Z811 Family history of alcohol abuse and dependence: Secondary | ICD-10-CM

## 2019-08-19 DIAGNOSIS — Z9981 Dependence on supplemental oxygen: Secondary | ICD-10-CM | POA: Diagnosis not present

## 2019-08-19 DIAGNOSIS — C3490 Malignant neoplasm of unspecified part of unspecified bronchus or lung: Secondary | ICD-10-CM | POA: Diagnosis present

## 2019-08-19 DIAGNOSIS — Z515 Encounter for palliative care: Secondary | ICD-10-CM

## 2019-08-19 DIAGNOSIS — R160 Hepatomegaly, not elsewhere classified: Secondary | ICD-10-CM | POA: Diagnosis not present

## 2019-08-19 DIAGNOSIS — E43 Unspecified severe protein-calorie malnutrition: Secondary | ICD-10-CM | POA: Diagnosis present

## 2019-08-19 DIAGNOSIS — R918 Other nonspecific abnormal finding of lung field: Secondary | ICD-10-CM | POA: Diagnosis not present

## 2019-08-19 DIAGNOSIS — J9611 Chronic respiratory failure with hypoxia: Secondary | ICD-10-CM | POA: Diagnosis present

## 2019-08-19 DIAGNOSIS — Z888 Allergy status to other drugs, medicaments and biological substances status: Secondary | ICD-10-CM

## 2019-08-19 DIAGNOSIS — R000 Tachycardia, unspecified: Secondary | ICD-10-CM | POA: Diagnosis present

## 2019-08-19 DIAGNOSIS — Z8249 Family history of ischemic heart disease and other diseases of the circulatory system: Secondary | ICD-10-CM | POA: Diagnosis not present

## 2019-08-19 DIAGNOSIS — Z79899 Other long term (current) drug therapy: Secondary | ICD-10-CM | POA: Diagnosis not present

## 2019-08-19 DIAGNOSIS — Z6822 Body mass index (BMI) 22.0-22.9, adult: Secondary | ICD-10-CM | POA: Diagnosis not present

## 2019-08-19 DIAGNOSIS — Z8349 Family history of other endocrine, nutritional and metabolic diseases: Secondary | ICD-10-CM

## 2019-08-19 DIAGNOSIS — Z720 Tobacco use: Secondary | ICD-10-CM | POA: Diagnosis not present

## 2019-08-19 DIAGNOSIS — Z7189 Other specified counseling: Secondary | ICD-10-CM

## 2019-08-19 DIAGNOSIS — R0602 Shortness of breath: Secondary | ICD-10-CM | POA: Diagnosis not present

## 2019-08-19 DIAGNOSIS — Z833 Family history of diabetes mellitus: Secondary | ICD-10-CM

## 2019-08-19 DIAGNOSIS — K5010 Crohn's disease of large intestine without complications: Secondary | ICD-10-CM | POA: Diagnosis present

## 2019-08-19 DIAGNOSIS — Z79891 Long term (current) use of opiate analgesic: Secondary | ICD-10-CM

## 2019-08-19 DIAGNOSIS — M810 Age-related osteoporosis without current pathological fracture: Secondary | ICD-10-CM | POA: Diagnosis present

## 2019-08-19 DIAGNOSIS — C787 Secondary malignant neoplasm of liver and intrahepatic bile duct: Secondary | ICD-10-CM | POA: Diagnosis present

## 2019-08-19 DIAGNOSIS — I10 Essential (primary) hypertension: Secondary | ICD-10-CM | POA: Diagnosis present

## 2019-08-19 DIAGNOSIS — K761 Chronic passive congestion of liver: Secondary | ICD-10-CM | POA: Diagnosis present

## 2019-08-19 DIAGNOSIS — K5730 Diverticulosis of large intestine without perforation or abscess without bleeding: Secondary | ICD-10-CM | POA: Diagnosis not present

## 2019-08-19 DIAGNOSIS — K769 Liver disease, unspecified: Secondary | ICD-10-CM | POA: Diagnosis not present

## 2019-08-19 DIAGNOSIS — Z87891 Personal history of nicotine dependence: Secondary | ICD-10-CM

## 2019-08-19 DIAGNOSIS — C3411 Malignant neoplasm of upper lobe, right bronchus or lung: Secondary | ICD-10-CM | POA: Diagnosis not present

## 2019-08-19 DIAGNOSIS — Z882 Allergy status to sulfonamides status: Secondary | ICD-10-CM | POA: Diagnosis not present

## 2019-08-19 DIAGNOSIS — J432 Centrilobular emphysema: Secondary | ICD-10-CM | POA: Diagnosis not present

## 2019-08-19 DIAGNOSIS — R509 Fever, unspecified: Secondary | ICD-10-CM

## 2019-08-19 LAB — URINALYSIS, ROUTINE W REFLEX MICROSCOPIC
Bilirubin Urine: NEGATIVE
Glucose, UA: NEGATIVE mg/dL
Hgb urine dipstick: NEGATIVE
Ketones, ur: NEGATIVE mg/dL
Leukocytes,Ua: NEGATIVE
Nitrite: NEGATIVE
Protein, ur: NEGATIVE mg/dL
Specific Gravity, Urine: 1.018 (ref 1.005–1.030)
pH: 7 (ref 5.0–8.0)

## 2019-08-19 LAB — COMPREHENSIVE METABOLIC PANEL
ALT: 30 U/L (ref 0–44)
AST: 60 U/L — ABNORMAL HIGH (ref 15–41)
Albumin: 2.8 g/dL — ABNORMAL LOW (ref 3.5–5.0)
Alkaline Phosphatase: 361 U/L — ABNORMAL HIGH (ref 38–126)
Anion gap: 11 (ref 5–15)
BUN: 9 mg/dL (ref 8–23)
CO2: 22 mmol/L (ref 22–32)
Calcium: 8.3 mg/dL — ABNORMAL LOW (ref 8.9–10.3)
Chloride: 101 mmol/L (ref 98–111)
Creatinine, Ser: 0.55 mg/dL (ref 0.44–1.00)
GFR calc Af Amer: >60 mL/min (ref >60)
GFR calc non Af Amer: >60 mL/min (ref >60)
Glucose, Bld: 136 mg/dL — ABNORMAL HIGH (ref 70–99)
Potassium: 3.7 mmol/L (ref 3.5–5.1)
Sodium: 134 mmol/L — ABNORMAL LOW (ref 135–145)
Total Bilirubin: 1.2 mg/dL (ref 0.3–1.2)
Total Protein: 6.6 g/dL (ref 6.5–8.1)

## 2019-08-19 LAB — CBC WITH DIFFERENTIAL/PLATELET
Abs Immature Granulocytes: 0.12 10*3/uL — ABNORMAL HIGH (ref 0.00–0.07)
Basophils Absolute: 0.1 10*3/uL (ref 0.0–0.1)
Basophils Relative: 1 %
Eosinophils Absolute: 0 10*3/uL (ref 0.0–0.5)
Eosinophils Relative: 0 %
HCT: 38.2 % (ref 36.0–46.0)
Hemoglobin: 12.1 g/dL (ref 12.0–15.0)
Immature Granulocytes: 1 %
Lymphocytes Relative: 6 %
Lymphs Abs: 1 10*3/uL (ref 0.7–4.0)
MCH: 27.6 pg (ref 26.0–34.0)
MCHC: 31.7 g/dL (ref 30.0–36.0)
MCV: 87.2 fL (ref 80.0–100.0)
Monocytes Absolute: 1.4 10*3/uL — ABNORMAL HIGH (ref 0.1–1.0)
Monocytes Relative: 9 %
Neutro Abs: 14.2 10*3/uL — ABNORMAL HIGH (ref 1.7–7.7)
Neutrophils Relative %: 83 %
Platelets: 475 10*3/uL — ABNORMAL HIGH (ref 150–400)
RBC: 4.38 MIL/uL (ref 3.87–5.11)
RDW: 14 % (ref 11.5–15.5)
WBC: 16.9 10*3/uL — ABNORMAL HIGH (ref 4.0–10.5)
nRBC: 0 % (ref 0.0–0.2)

## 2019-08-19 LAB — SARS CORONAVIRUS 2 (TAT 6-24 HRS): SARS Coronavirus 2: NEGATIVE

## 2019-08-19 LAB — LACTIC ACID, PLASMA: Lactic Acid, Venous: 1.6 mmol/L (ref 0.5–1.9)

## 2019-08-19 LAB — PROTIME-INR
INR: 1.3 — ABNORMAL HIGH (ref 0.8–1.2)
Prothrombin Time: 15.6 seconds — ABNORMAL HIGH (ref 11.4–15.2)

## 2019-08-19 MED ORDER — SODIUM CHLORIDE 0.9 % IV SOLN
1.0000 g | INTRAVENOUS | Status: DC
  Administered 2019-08-19 – 2019-08-20 (×2): 1 g via INTRAVENOUS
  Filled 2019-08-19 (×2): qty 10

## 2019-08-19 MED ORDER — ACETAMINOPHEN 500 MG PO TABS
1000.0000 mg | ORAL_TABLET | Freq: Every day | ORAL | Status: DC | PRN
  Administered 2019-08-19 – 2019-08-21 (×2): 1000 mg via ORAL
  Filled 2019-08-19 (×2): qty 2

## 2019-08-19 MED ORDER — POLYETHYLENE GLYCOL 3350 17 G PO PACK: 17.0000 g | PACK | Freq: Every day | ORAL | Status: DC | PRN

## 2019-08-19 MED ORDER — PROPRANOLOL HCL 10 MG PO TABS
5.0000 mg | ORAL_TABLET | Freq: Two times a day (BID) | ORAL | Status: DC
  Administered 2019-08-20 – 2019-08-21 (×2): 5 mg via ORAL
  Filled 2019-08-19 (×6): qty 0.5

## 2019-08-19 MED ORDER — IOHEXOL 300 MG/ML  SOLN
100.0000 mL | Freq: Once | INTRAMUSCULAR | Status: AC | PRN
  Administered 2019-08-19: 100 mL via INTRAVENOUS

## 2019-08-19 MED ORDER — HYDRALAZINE HCL 20 MG/ML IJ SOLN: 5.0000 mg | INTRAMUSCULAR | Status: DC | PRN

## 2019-08-19 MED ORDER — SODIUM CHLORIDE 0.9 % IV BOLUS
1000.0000 mL | Freq: Once | INTRAVENOUS | Status: AC
  Administered 2019-08-19: 1000 mL via INTRAVENOUS

## 2019-08-19 MED ORDER — OXYCODONE HCL 5 MG PO TABS: 5.0000 mg | ORAL_TABLET | Freq: Four times a day (QID) | ORAL | Status: DC | PRN

## 2019-08-19 MED ORDER — AMLODIPINE BESYLATE 5 MG PO TABS
5.0000 mg | ORAL_TABLET | Freq: Every day | ORAL | Status: DC
  Administered 2019-08-19 – 2019-08-20 (×2): 5 mg via ORAL
  Filled 2019-08-19 (×2): qty 1

## 2019-08-19 MED ORDER — SODIUM CHLORIDE 0.9 % IV BOLUS
500.0000 mL | Freq: Once | INTRAVENOUS | Status: AC
  Administered 2019-08-19: 500 mL via INTRAVENOUS

## 2019-08-19 MED ORDER — DICLOFENAC SODIUM 1 % EX GEL
4.0000 g | Freq: Four times a day (QID) | CUTANEOUS | Status: DC | PRN
  Filled 2019-08-19: qty 100

## 2019-08-19 MED ORDER — ALBUTEROL SULFATE HFA 108 (90 BASE) MCG/ACT IN AERS: 2.0000 | INHALATION_SPRAY | Freq: Four times a day (QID) | RESPIRATORY_TRACT | Status: DC | PRN

## 2019-08-19 MED ORDER — HEPARIN SODIUM (PORCINE) 5000 UNIT/ML IJ SOLN
5000.0000 [IU] | Freq: Three times a day (TID) | INTRAMUSCULAR | Status: DC
  Administered 2019-08-19 – 2019-08-20 (×2): 5000 [IU] via SUBCUTANEOUS
  Filled 2019-08-19 (×5): qty 1

## 2019-08-19 MED ORDER — UMECLIDINIUM BROMIDE 62.5 MCG/INH IN AEPB
1.0000 | INHALATION_SPRAY | Freq: Every evening | RESPIRATORY_TRACT | Status: DC
  Administered 2019-08-20: 1 via RESPIRATORY_TRACT
  Filled 2019-08-19: qty 7

## 2019-08-19 MED ORDER — SODIUM CHLORIDE 0.9 % IV SOLN: INTRAVENOUS | Status: DC

## 2019-08-19 MED ORDER — FENTANYL CITRATE (PF) 100 MCG/2ML IJ SOLN
50.0000 ug | INTRAMUSCULAR | Status: DC | PRN
  Administered 2019-08-19 – 2019-08-21 (×12): 50 ug via INTRAVENOUS
  Filled 2019-08-19 (×12): qty 2

## 2019-08-19 NOTE — ED Provider Notes (Signed)
[facility] Provider Note   CSN: [id number] Arrival date & time: 08/19/19  1248     History Chief Complaint  Patient presents with  . Fever    Stacey Nash is a 69 y.o. female.  She has a history of COPD and is on chronic oxygen.  She has had some right sided rib or chest wall pain that is been going on for a few months.  She thought it was possibly related to an old broken rib.  Pain has been getting steadily more progressive.  Associated with 10 pound weight loss.  She went to [facility] where she had a CT on the ninth of this month.  She did not end up getting evaluated there but took the report to her primary care doctor.  They commented upon liver mass likely metastatic disease.  She was set up for an outpatient IR guided biopsy, but became having more pain and fevers.  Fever T-max of 100.6 intermittent for about 10 days.  No cough no sore throat no vomiting or diarrhea no urinary symptoms.  No rashes or swollen joints.  The history is provided by the patient.  Fever Max temp prior to arrival:  100.6 Temp source:  Oral Severity:  Moderate Onset quality:  Gradual Duration:  10 days Timing:  Intermittent Progression:  Unchanged Chronicity:  New Relieved by:  Acetaminophen Worsened by:  Nothing Ineffective treatments:  None tried Associated symptoms: chest pain (right lower lateral) and chills   Associated symptoms: no cough, no diarrhea, no dysuria, no headaches, no nausea, no rash, no rhinorrhea, no sore throat and no vomiting        Past Medical History:  Diagnosis Date  . Allergy or intolerance to drug    Doxycycline caused worsening shortness of breath and loose stools.  . Asthma   . Chronic neck pain   . COPD (chronic obstructive pulmonary disease) ([place])   . Crohn's colitis ([place]) 10/03/2018  . Diverticula of intestine   . Hypertension   . Left adrenal mass ([place])   . Osteoporosis   . Tobacco abuse     Patient Active Problem List   Diagnosis Date Noted  . Crohn's colitis ([place]) 10/03/2018  . Essential hypertension, benign 02/14/2018  . HTN, goal below 140/90 02/14/2018  . Adrenal mass ([place]) 02/14/2018  . Hyperthyroidism 10/16/2017  . Guaiac positive stools 09/25/2017  . Diarrhea 09/25/2017  . COPD exacerbation ([place]) 04/14/2013  . Hyperglycemia, drug-induced 04/14/2013  . Acute respiratory failure with hypoxia ([place]) 04/14/2013  . Hypokalemia 04/14/2013  . Allergy or intolerance to drug 04/14/2013  . Tobacco abuse 04/14/2013    Past Surgical History:  Procedure Laterality Date  . APPENDECTOMY     2013  . BIOPSY  10/01/2017   Procedure: BIOPSY;  Surgeon: Rogene Houston, MD;  Location[facility];  Service: Endoscopy;;  left and right colon  . CHOLECYSTECTOMY    . COLONOSCOPY WITH PROPOFOL N/A 10/01/2017   Procedure: COLONOSCOPY WITH PROPOFOL;  Surgeon: Rogene Houston, MD;  Location: [facility];  Service: Endoscopy;  Laterality: N/A;  . ESOPHAGOGASTRODUODENOSCOPY N/A 09/09/2014   Procedure: ESOPHAGOGASTRODUODENOSCOPY (EGD);  Surgeon: Rogene Houston, MD;  Location: [facility];  Service: Endoscopy;  Laterality: N/A;  210 - moved to 9:10 - Ann to notify pt  . EYE SURGERY     cataract/implants  . LAPAROSCOPIC APPENDECTOMY  07/14/2011   Procedure: APPENDECTOMY LAPAROSCOPIC;  Surgeon: Donato Heinz, MD;  Location: [facility];  Service: General;  Laterality: N/A;  . POLYPECTOMY  10/01/2017   Procedure: POLYPECTOMY;  Surgeon: Rogene Houston, MD;  Location: [facility];  Service: Endoscopy;;  rectal     OB History   No obstetric history on file.     Family History  Problem Relation Age of Onset  . Hypertension Mother   . Thyroid disease Mother   . Diabetes Father   . Alcoholism Father   . Crohn's disease Brother   . Hypertension Son     Social History   Tobacco Use  . Smoking status: Former Smoker    Packs/day: 2.00    Years: 50.00    Pack years: 100.00    Types: Cigarettes    Quit date:  09/29/2013    Years since quitting: 5.8  . Smokeless tobacco: Former Systems developer    Quit date: 09/05/2013  . Tobacco comment: quit 4 yrs ago.   Substance Use Topics  . Alcohol use: No    Alcohol/week: 0.0 standard drinks  . Drug use: No    Home Medications Prior to Admission medications   Medication Sig Start Date End Date Taking? Authorizing Provider  acetaminophen (TYLENOL) 500 MG tablet Take 500 mg by mouth daily as needed for moderate pain.    [provider]  albuterol (PROVENTIL HFA;VENTOLIN HFA) 108 (90 BASE) MCG/ACT inhaler Inhale 2 puffs into the lungs every 6 (six) hours as needed for wheezing or shortness of breath.     [provider]  albuterol (PROVENTIL) (2.5 MG/3ML) 0.083% nebulizer solution Take 2.5 mg by nebulization every 6 (six) hours as needed for wheezing or shortness of breath.    [provider]  aluminum hydroxide-magnesium carbonate (GAVISCON) 95-358 MG/15ML SUSP Take 15 mLs by mouth as needed for indigestion or heartburn.    [provider]  amLODipine (NORVASC) 5 MG tablet Take 1 tablet (5 mg total) by mouth daily. 02/14/18   Cassandria Anger, MD  Calcium Carb-Cholecalciferol (CALCIUM 600 + D PO) Take 1 tablet by mouth daily.     [provider]  Cholecalciferol (VITAMIN D3) 1.25 MG (50000 UT) CAPS Take 2,000 Units by mouth.    [provider]  loperamide (IMODIUM) 2 MG capsule Take 1 capsule (2 mg total) by mouth 4 (four) times daily as needed for diarrhea or loose stools. Patient taking differently: Take 2 mg by mouth as needed for diarrhea or loose stools.  03/07/16   Horton, Barbette Hair, MD  Menthol, Topical Analgesic, (ICY HOT EX) Apply 1 application topically daily as needed (pain).    [provider]  Multiple Vitamins-Minerals (CENTRUM SILVER ULTRA WOMENS PO) Take by mouth daily.    [provider]  Omega-3 Fatty Acids (FISH OIL) 1000 MG CAPS Take 1,000 mg by mouth 2 (two) times daily.      [provider]  OXYGEN Inhale 2 L into the lungs daily.    [provider]  propranolol (INDERAL) 10 MG tablet TAKE 1 TABLET BY MOUTH TWICE A DAY Patient taking differently: 1/2 tab at night 03/06/18   Cassandria Anger, MD  tiotropium (SPIRIVA HANDIHALER) 18 MCG inhalation capsule Place 18 mcg into inhaler and inhale every evening.     [provider]    Allergies    Sulfa antibiotics, Tetanus toxoids, Doxycycline, Statins, and Zofran [ondansetron hcl]  Review of Systems   Review of Systems  Constitutional: Positive for chills, fever and unexpected weight change.  HENT: Negative for rhinorrhea and sore throat.  Eyes: Negative for visual disturbance.  Respiratory: Negative for cough.   Cardiovascular: Positive for chest pain (right lower lateral).  Gastrointestinal: Negative for diarrhea, nausea and vomiting.  Genitourinary: Negative for dysuria, vaginal bleeding and vaginal discharge.  Musculoskeletal: Negative for neck pain.  Skin: Negative for rash.  Neurological: Negative for headaches.    Physical Exam Updated Vital Signs BP 135/85   Pulse (!) 119   Temp 97.7 F (36.5 C) (Oral)   Resp 15   Ht 5' 2"  (1.575 m)   Wt 56.7 kg   SpO2 97%   BMI 22.86 kg/m   Physical Exam Vitals and nursing note reviewed.  Constitutional:      General: She is not in acute distress.    Appearance: She is well-developed and underweight.  HENT:     Head: Normocephalic and atraumatic.  Eyes:     Conjunctiva/sclera: Conjunctivae normal.  Cardiovascular:     Rate and Rhythm: Regular rhythm. Tachycardia present.     Pulses: Normal pulses.     Heart sounds: No murmur.  Pulmonary:     Effort: Pulmonary effort is normal. No respiratory distress.     Breath sounds: Normal breath sounds.  Chest:     Chest wall: Tenderness present.  Abdominal:     Palpations: Abdomen is soft.     Tenderness: There is no abdominal tenderness. There is no guarding or rebound.    Musculoskeletal:        General: No deformity or signs of injury. Normal range of motion.     Cervical back: Neck supple.     Right lower leg: No edema.     Left lower leg: No edema.  Skin:    General: Skin is warm and dry.     Capillary Refill: Capillary refill takes less than 2 seconds.  Neurological:     General: No focal deficit present.     Mental Status: She is alert.     Sensory: No sensory deficit.     Motor: No weakness.     ED Results / Procedures / Treatments   Labs (all labs ordered are listed, but only abnormal results are displayed) Labs Reviewed  COMPREHENSIVE METABOLIC PANEL - Abnormal; Notable for the following components:      Result Value   Sodium 134 (*)    Glucose, Bld 136 (*)    Calcium 8.3 (*)    Albumin 2.8 (*)    AST 60 (*)    Alkaline Phosphatase 361 (*)    All other components within normal limits  CBC WITH DIFFERENTIAL/PLATELET - Abnormal; Notable for the following components:   WBC 16.9 (*)    Platelets 475 (*)    Neutro Abs 14.2 (*)    Monocytes Absolute 1.4 (*)    Abs Immature Granulocytes 0.12 (*)    All other components within normal limits  PROTIME-INR - Abnormal; Notable for the following components:   Prothrombin Time 15.6 (*)    INR 1.3 (*)    All other components within normal limits  URINALYSIS, ROUTINE W REFLEX MICROSCOPIC - Abnormal; Notable for the following components:   Color, Urine STRAW (*)    All other components within normal limits  CULTURE, BLOOD (ROUTINE X 2)  CULTURE, BLOOD (ROUTINE X 2)  URINE CULTURE  SARS CORONAVIRUS 2 (TAT 6-24 HRS)  URINE CULTURE  LACTIC ACID, PLASMA  HIV ANTIBODY (ROUTINE TESTING W REFLEX)  CBC  CREATININE, SERUM  CBC  COMPREHENSIVE METABOLIC PANEL    EKG EKG  Interpretation  Date/Time:  Tuesday August 19 2019 13:32:52 EDT Ventricular Rate:  111 PR Interval:    QRS Duration: 86 QT Interval:  320 QTC Calculation: 435 R Axis:   37 Text Interpretation: Sinus tachycardia Probable  left atrial enlargement Anteroseptal infarct, age indeterminate No significant change since prior 5/19 Confirmed by Aletta Edouard [phone]) on 08/19/2019 1:42:32 PM   Radiology CT ABDOMEN PELVIS W WO CONTRAST  Result Date: 08/19/2019 CLINICAL DATA:  Abnormal chest radiograph. Liver lesions on outside hospital CT abdomen/pelvis. EXAM: CT CHEST WITH CONTRAST CT ABDOMEN AND PELVIS WITH AND WITHOUT CONTRAST TECHNIQUE: Multidetector CT imaging of the chest was performed during intravenous contrast administration. Multidetector CT imaging of the abdomen and pelvis was performed following the standard protocol before and during bolus administration of intravenous contrast. CONTRAST:  186m OMNIPAQUE IOHEXOL 300 MG/ML  SOLN COMPARISON:  Chest radiographs dated 08/19/2019. CT abdomen/pelvis dated 09/21/2016. FINDINGS: CT CHEST FINDINGS Cardiovascular: Heart is normal in size.  No pericardial effusion. No evidence of thoracic aortic aneurysm. Atherosclerotic calcifications of the aortic arch. Mild coronary atherosclerosis the LAD and right coronary artery. Mediastinum/Nodes: 8 mm short axis low right paratracheal node (series 9/image 45). 16 mm short axis right hilar node (series 9/image 49). 10 mm short axis right perihilar node (series 9/image 42). Visualized thyroid is notable for a 5 mm right thyroid nodule (series 9/image 15). No followup recommended (ref: J Am Coll Radiol. 2015 Feb;12(2): 143-50). Lungs/Pleura: 2.7 x 3.8 x 2.6 cm spiculated right upper lobe mass (series 11/image 63), compatible with primary bronchogenic neoplasm. Mild scarring/atelectasis in the medial right middle lobe and posterior lingula. No focal consolidation. Moderate centrilobular and paraseptal emphysematous changes, upper lung predominant. Mild biapical pleural-parenchymal scarring, right greater than left. No pleural effusion or pneumothorax. Musculoskeletal: No focal osseous lesions. CT ABDOMEN AND PELVIS FINDINGS Hepatobiliary:  Numerous hepatic metastases throughout both lobes. Index lesions include: --2.2 x 2.8 cm lesion in segment 8 (series 9/image 82) --1.6 x 2.0 cm lesion in segment 2 (series 9/image 88) --3.9 x 5.1 cm lesion in segment 7 (series 9/image 89) --1.8 x 2.1 cm lesion in segment 3 (series 9/image 111) Status post cholecystectomy. No intrahepatic or extrahepatic ductal dilatation. Pancreas: Within normal limits. Spleen: Within normal limits. Adrenals/Urinary Tract: 3.2 x 3.5 cm left adrenal mass containing macroscopic fat and coarse calcifications, previously 3.3 x 3.4 cm in 2018. 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 Regardless, given stability, this can be considered benign. Stable mild nodularity right adrenal gland measuring up to 9 mm (series 9/image 90), unchanged, benign. Kidneys are within normal limits.  No hydronephrosis. Bladder is within normal limits. Stomach/Bowel: Stomach is within normal limits. No evidence of bowel obstruction. Prior appendectomy. Sigmoid diverticulosis, without evidence of diverticulitis. Vascular/Lymphatic: No evidence of abdominal aortic aneurysm. Atherosclerotic calcifications of the abdominal aorta and branch vessels. No suspicious abdominopelvic lymphadenopathy. Reproductive: Uterus is within normal limits. No adnexal masses. Other: No abdominopelvic ascites. Musculoskeletal: No focal osseous lesions. IMPRESSION: 3.8 cm spiculated right upper lobe mass, compatible with primary bronchogenic neoplasm. Associated thoracic nodal metastases, as above. Innumerable hepatic metastases in both lobes, with index lesions as above. Benign bilateral adrenal nodules/masses. Patient is scheduled for tissue sampling of the suspected hepatic metastases. PET-CT may also be beneficial for staging, as clinically warranted. Electronically Signed   By: SJulian HyM.D.   On: 08/19/2019 15:35   CT Chest W Contrast  Result Date: 08/19/2019 CLINICAL DATA:  Abnormal chest radiograph. Liver  lesions on outside hospital CT  abdomen/pelvis. EXAM: CT CHEST WITH CONTRAST CT ABDOMEN AND PELVIS WITH AND WITHOUT CONTRAST TECHNIQUE: Multidetector CT imaging of the chest was performed during intravenous contrast administration. Multidetector CT imaging of the abdomen and pelvis was performed following the standard protocol before and during bolus administration of intravenous contrast. CONTRAST:  136m OMNIPAQUE IOHEXOL 300 MG/ML  SOLN COMPARISON:  Chest radiographs dated 08/19/2019. CT abdomen/pelvis dated 09/21/2016. FINDINGS: CT CHEST FINDINGS Cardiovascular: Heart is normal in size.  No pericardial effusion. No evidence of thoracic aortic aneurysm. Atherosclerotic calcifications of the aortic arch. Mild coronary atherosclerosis the LAD and right coronary artery. Mediastinum/Nodes: 8 mm short axis low right paratracheal node (series 9/image 45). 16 mm short axis right hilar node (series 9/image 49). 10 mm short axis right perihilar node (series 9/image 42). Visualized thyroid is notable for a 5 mm right thyroid nodule (series 9/image 15). No followup recommended (ref: J Am Coll Radiol. 2015 Feb;12(2): 143-50). Lungs/Pleura: 2.7 x 3.8 x 2.6 cm spiculated right upper lobe mass (series 11/image 63), compatible with primary bronchogenic neoplasm. Mild scarring/atelectasis in the medial right middle lobe and posterior lingula. No focal consolidation. Moderate centrilobular and paraseptal emphysematous changes, upper lung predominant. Mild biapical pleural-parenchymal scarring, right greater than left. No pleural effusion or pneumothorax. Musculoskeletal: No focal osseous lesions. CT ABDOMEN AND PELVIS FINDINGS Hepatobiliary: Numerous hepatic metastases throughout both lobes. Index lesions include: --2.2 x 2.8 cm lesion in segment 8 (series 9/image 82) --1.6 x 2.0 cm lesion in segment 2 (series 9/image 88) --3.9 x 5.1 cm lesion in segment 7 (series 9/image 89) --1.8 x 2.1 cm lesion in segment 3 (series 9/image 111)  Status post cholecystectomy. No intrahepatic or extrahepatic ductal dilatation. Pancreas: Within normal limits. Spleen: Within normal limits. Adrenals/Urinary Tract: 3.2 x 3.5 cm left adrenal mass containing macroscopic fat and coarse calcifications, previously 3.3 x 3.4 cm in 2018. 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 Regardless, given stability, this can be considered benign. Stable mild nodularity right adrenal gland measuring up to 9 mm (series 9/image 90), unchanged, benign. Kidneys are within normal limits.  No hydronephrosis. Bladder is within normal limits. Stomach/Bowel: Stomach is within normal limits. No evidence of bowel obstruction. Prior appendectomy. Sigmoid diverticulosis, without evidence of diverticulitis. Vascular/Lymphatic: No evidence of abdominal aortic aneurysm. Atherosclerotic calcifications of the abdominal aorta and branch vessels. No suspicious abdominopelvic lymphadenopathy. Reproductive: Uterus is within normal limits. No adnexal masses. Other: No abdominopelvic ascites. Musculoskeletal: No focal osseous lesions. IMPRESSION: 3.8 cm spiculated right upper lobe mass, compatible with primary bronchogenic neoplasm. Associated thoracic nodal metastases, as above. Innumerable hepatic metastases in both lobes, with index lesions as above. Benign bilateral adrenal nodules/masses. Patient is scheduled for tissue sampling of the suspected hepatic metastases. PET-CT may also be beneficial for staging, as clinically warranted. Electronically Signed   By: SJulian HyM.D.   On: 08/19/2019 15:35   DG Chest Port 1 View  Result Date: 08/19/2019 CLINICAL DATA:  Fever, shortness of breath. EXAM: PORTABLE CHEST 1 VIEW COMPARISON:  September 21, 2016. FINDINGS: The heart size and mediastinal contours are within normal limits. No pneumothorax or pleural effusion is noted. Left lung is clear. Large spiculated mass is noted in right upper lobe concerning for malignancy; CT scan of the chest  with intravenous contrast is recommended for further evaluation. The visualized skeletal structures are unremarkable. IMPRESSION: Large spiculated mass is noted in right upper lobe concerning for malignancy; CT scan of the chest with intravenous contrast is recommended for further evaluation. Electronically Signed  By: Marijo Conception M.D.   On: 08/19/2019 13:51    Procedures Procedures (including critical care time)  Medications Ordered in ED Medications  fentaNYL (SUBLIMAZE) injection 50 mcg (50 mcg Intravenous Given 08/19/19 1452)  albuterol (VENTOLIN HFA) 108 (90 Base) MCG/ACT inhaler 2 puff (has no administration in time range)  acetaminophen (TYLENOL) tablet 1,000 mg (has no administration in time range)  propranolol (INDERAL) tablet 5 mg (has no administration in time range)  amLODipine (NORVASC) tablet 5 mg (has no administration in time range)  oxyCODONE (Oxy IR/ROXICODONE) immediate release tablet 5 mg (has no administration in time range)  tiotropium (SPIRIVA) inhalation capsule ([facility] use ONLY) 18 mcg (has no administration in time range)  heparin injection 5,000 Units (has no administration in time range)  0.9 %  sodium chloride infusion (has no administration in time range)  polyethylene glycol (MIRALAX / GLYCOLAX) packet 17 g (has no administration in time range)  cefTRIAXone (ROCEPHIN) 1 g in sodium chloride 0.9 % 100 mL IVPB (has no administration in time range)  diclofenac Sodium (VOLTAREN) 1 % topical gel 4 g (has no administration in time range)  sodium chloride 0.9 % bolus 1,000 mL (has no administration in time range)  hydrALAZINE (APRESOLINE) injection 5-10 mg (has no administration in time range)  sodium chloride 0.9 % bolus 500 mL (0 mLs Intravenous Stopped 08/19/19 1530)  iohexol (OMNIPAQUE) 300 MG/ML solution 100 mL (100 mLs Intravenous Contrast Given 08/19/19 1501)    ED Course  I have reviewed the triage vital signs and the nursing notes.  Pertinent labs &  imaging results that were available during my care of the patient were reviewed by me and considered in my medical decision making (see chart for details).  Clinical Course as of Aug 19 1807  Tue Aug 19, 2019  1330 CT from [facility] - 08/05/19 - Bilobar hepatic metastatic disease; stable adrenal fat containing mass   [MB]  1332 DDX - hepatic abscess, pneumonia, uti, pneumonia, bacteremia, sepsis, tumor fever.    [MB]  2947 Chest x-ray interpreted by me as COPD possible right chest mass.  Awaiting radiology reading   [MB]  1359 Chest x-ray read as spiculated mass in the right upper lung.  They recommend CT chest.  We will also repeat CT abdomen and pelvis working on excluding where her fever is from.   [MB]  1411 Labs showing elevated white count of 16.9.  Chemistry is okay a mildly low calcium 8.3.  Low albumin 2.8 down from 4 couple years ago.  AST elevated at 60 and alk phos elevated at 361.  INR slightly up at 1.3.  Lactate normal.   [MB]    Clinical Course User Index [MB] Hayden Rasmussen, MD   MDM Rules/Calculators/A&P                      Signed out to Dr Marsa Aris with plan to followup on CT imaging and likley admit for further workup and management.  Final Clinical Impression(s) / ED Diagnoses Final diagnoses:  Fever, unspecified fever cause  Lung mass  Liver mass    Rx / DC Orders ED Discharge Orders    None       Hayden Rasmussen, MD 08/19/19 1811

## 2019-08-19 NOTE — ED Triage Notes (Signed)
Pt sent from dr. Durene Cal office for liver eval. Fever x 1 week.

## 2019-08-19 NOTE — H&P (Signed)
History and Physical    Stacey Nash ZSW:[id number] DOB: 06-Dec-1950 DOA: 08/19/2019  PCP: Celene Squibb, MD Patient coming from: Home  Chief Complaint: R side/chest pain and fever  HPI: Stacey Nash is a 69 y.o. female with medical history significant of O2 dependent COPD, 50pack year smoker, Crohn's, HTN, SVT.   Pt presents at the request of PCP after several days of fevers, anorexia, general malaise, and R side and chest wall pain. Pt also w/ 10lb unintentional wt loss over the last few weeks. Has had broken ribs before and feels the pain is the same although no increased pain w/ inspiration. Pain is worse w/ certain movement. No change in BM habits. No COVID exposure. No cough. Pt states she quit smoking 6 years ago but has a 50pack year smoking history.   Pt is joined by her daughter in law  ED Course: objective findings below. New finding of a spiculate mass on on CT chest.   Review of Systems: As per HPI otherwise all other systems reviewed and are negative  Ambulatory Status:no restrictions other than needing O2.   Past Medical History:  Diagnosis Date  . Allergy or intolerance to drug    Doxycycline caused worsening shortness of breath and loose stools.  . Asthma   . Chronic neck pain   . COPD (chronic obstructive pulmonary disease) ([place])   . Crohn's colitis ([place]) 10/03/2018  . Diverticula of intestine   . Hypertension   . Left adrenal mass ([place])   . Osteoporosis   . Tobacco abuse     Past Surgical History:  Procedure Laterality Date  . APPENDECTOMY     2013  . BIOPSY  10/01/2017   Procedure: BIOPSY;  Surgeon: Rogene Houston, MD;  [facility];  Service: Endoscopy;;  left and right colon  . CHOLECYSTECTOMY    . COLONOSCOPY WITH PROPOFOL N/A 10/01/2017   Procedure: COLONOSCOPY WITH PROPOFOL;  Surgeon: Rogene Houston, MD;  Location: [facility];  Service: Endoscopy;  Laterality: N/A;  . ESOPHAGOGASTRODUODENOSCOPY N/A 09/09/2014   Procedure:  ESOPHAGOGASTRODUODENOSCOPY (EGD);  Surgeon: Rogene Houston, MD;  [facility]: [facility];  Service: Endoscopy;  Laterality: N/A;  210 - moved to 9:10 - Ann to notify pt  . EYE SURGERY     cataract/implants  . LAPAROSCOPIC APPENDECTOMY  07/14/2011   Procedure: APPENDECTOMY LAPAROSCOPIC;  Surgeon: Donato Heinz, MD;  Location: [facility];  Service: General;  Laterality: N/A;  . POLYPECTOMY  10/01/2017   Procedure: POLYPECTOMY;  Surgeon: Rogene Houston, MD;  [facility];  Service: Endoscopy;;  rectal    Social History   Socioeconomic History  . Marital status: Widowed    Spouse name: Not on file  . Number of children: Not on file  . Years of education: Not on file  . Highest education level: Not on file  Occupational History  . Not on file  Tobacco Use  . Smoking status: Former Smoker    Packs/day: 2.00    Years: 50.00    Pack years: 100.00    Types: Cigarettes    Quit date: 09/29/2013    Years since quitting: 5.8  . Smokeless tobacco: Former Systems developer    Quit date: 09/05/2013  . Tobacco comment: quit 4 yrs ago.   Substance and Sexual Activity  . Alcohol use: No    Alcohol/week: 0.0 standard drinks  . Drug use: No  . Sexual activity: Not Currently  Other Topics Concern  .  Not on file  Social History Narrative   Retired from book keeping and taxes.   Single, divorced.   3 boys.    Enjoys puzzles, reading, shopping.   Does not attend church, but watches tv church.   Does not eat red meat. Does not eat eggs or drink dairy.   Drives.   Wear seatbelt.    Youngest son lives with her.    Has grandchildren.    Social Determinants of Health   Financial Resource Strain:   . Difficulty of Paying Living Expenses:   Food Insecurity:   . Worried About Charity fundraiser in the Last Year:   . Arboriculturist in the Last Year:   Transportation Needs:   . Film/video editor (Medical):   Marland Kitchen Lack of Transportation (Non-Medical):   Physical Activity:   . Days of Exercise  per Week:   . Minutes of Exercise per Session:   Stress:   . Feeling of Stress :   Social Connections:   . Frequency of Communication with Friends and Family:   . Frequency of Social Gatherings with Friends and Family:   . Attends Religious Services:   . Active Member of Clubs or Organizations:   . Attends Archivist Meetings:   Marland Kitchen Marital Status:   Intimate Partner Violence:   . Fear of Current or Ex-Partner:   . Emotionally Abused:   Marland Kitchen Physically Abused:   . Sexually Abused:     Allergies  Allergen Reactions  . Sulfa Antibiotics Shortness Of Breath  . Tetanus Toxoids Rash  . Doxycycline Diarrhea  . Statins     Leg pain and weakness  . Zofran [Ondansetron Hcl] Hives and Rash    Rash at injection site that spread immediately following administration      Family History  Problem Relation Age of Onset  . Hypertension Mother   . Thyroid disease Mother   . Diabetes Father   . Alcoholism Father   . Crohn's disease Brother   . Hypertension Son       Prior to Admission medications   Medication Sig Start Date End Date Taking? Authorizing Provider  acetaminophen (TYLENOL) 500 MG tablet Take 1,000 mg by mouth daily as needed for mild pain, moderate pain or headache.    Yes [provider]  albuterol (PROVENTIL HFA;VENTOLIN HFA) 108 (90 BASE) MCG/ACT inhaler Inhale 2 puffs into the lungs every 6 (six) hours as needed for wheezing or shortness of breath.    Yes [provider]  albuterol (PROVENTIL) (2.5 MG/3ML) 0.083% nebulizer solution Take 2.5 mg by nebulization every 6 (six) hours as needed for wheezing or shortness of breath.   Yes [provider]  aluminum hydroxide-magnesium carbonate (GAVISCON) 95-358 MG/15ML SUSP Take 15 mLs by mouth 3 (three) times daily as needed for indigestion or heartburn.    Yes [provider]  amLODipine (NORVASC) 5 MG tablet Take 1 tablet (5 mg total) by mouth daily. 02/14/18  Yes Cassandria Anger, MD   Calcium Carb-Cholecalciferol (CALCIUM 600 + D PO) Take 1 tablet by mouth daily.    Yes [provider]  Cholecalciferol (VITAMIN D3) 25 MCG (1000 UT) CAPS Take 2,000 Units by mouth daily.    Yes [provider]  fluticasone (FLONASE) 50 MCG/ACT nasal spray Place 1-2 sprays into both nostrils daily as needed. 04/28/19  Yes [provider]  loperamide (IMODIUM) 2 MG capsule Take 1 capsule (2 mg total) by mouth 4 (four)  times daily as needed for diarrhea or loose stools. Patient taking differently: Take 2 mg by mouth daily as needed for diarrhea or loose stools.  03/07/16  Yes Horton, Barbette Hair, MD  Menthol, Topical Analgesic, (ICY HOT EX) Apply 1 application topically daily as needed (pain).   Yes [provider]  Multiple Vitamins-Minerals (CENTRUM SILVER ULTRA WOMENS PO) Take by mouth daily.   Yes [provider]  Omega-3 Fatty Acids (FISH OIL) 1000 MG CAPS Take 1,000 mg by mouth 2 (two) times daily.    Yes [provider]  oxyCODONE (OXY IR/ROXICODONE) 5 MG immediate release tablet Take 5 mg by mouth every 6 (six) hours as needed. 08/06/19  Yes [provider]  OXYGEN Inhale 2 L into the lungs daily.   Yes [provider]  promethazine (PHENERGAN) 25 MG tablet Take 12.5-25 mg by mouth every 6 (six) hours as needed. 08/06/19  Yes [provider]  propranolol (INDERAL) 10 MG tablet TAKE 1 TABLET BY MOUTH TWICE A DAY Patient taking differently: Take 5 mg by mouth daily as needed. 1/2 tab at night 03/06/18  Yes Nida, Marella Chimes, MD  tiotropium (SPIRIVA HANDIHALER) 18 MCG inhalation capsule Place 18 mcg into inhaler and inhale every evening.    Yes [provider]    Physical Exam: Vitals:   08/19/19 1600 08/19/19 1630 08/19/19 1700 08/19/19 1730  BP: (!) 146/69 (!) 153/78 128/76 (!) 161/86  Pulse: (!) 109 (!) 123 (!) 115 (!) 124  Resp: 12 17 15 19   Temp:      TempSrc:      SpO2: 100% 99% 99% 100%    Weight:      Height:         General: Elderly and frail. Appears comfortable.  Eyes:  PERRL, EOMI, normal lids, iris ENT: Dry mm grossly normal hearing, lips & tongue, Neck:  no LAD, masses or thyromegaly Cardiovascular: II/VI systolic murmur, faint heart sounds. RRR,No LE edema.  Respiratory:  CTA bilaterally, no w/r/r. Normal respiratory effort. Abdomen:  soft, ntnd, NABS Skin:  no rash or induration seen on limited exam Musculoskeletal: TTP along R lower rib cage on light palpation. No bony abnormality. Psychiatric:  grossly normal mood and affect, speech fluent and appropriate, AOx3 Neurologic:  CN 2-12 grossly intact, moves all extremities in coordinated fashion, sensation intact  Labs on Admission: I have personally reviewed following labs and imaging studies  CBC: Recent Labs  Lab 08/19/19 1340  WBC 16.9*  NEUTROABS 14.2*  HGB 12.1  HCT 38.2  MCV 87.2  PLT 456*   Basic Metabolic Panel: Recent Labs  Lab 08/19/19 1340  NA 134*  K 3.7  CL 101  CO2 22  GLUCOSE 136*  BUN 9  CREATININE 0.55  CALCIUM 8.3*   GFR: Estimated Creatinine Clearance: 51.8 mL/min (by C-G formula based on SCr of 0.55 mg/dL). Liver Function Tests: Recent Labs  Lab 08/19/19 1340  AST 60*  ALT 30  ALKPHOS 361*  BILITOT 1.2  PROT 6.6  ALBUMIN 2.8*   No results for input(s): LIPASE, AMYLASE in the last 168 hours. No results for input(s): AMMONIA in the last 168 hours. Coagulation Profile: Recent Labs  Lab 08/19/19 1340  INR 1.3*   Cardiac Enzymes: No results for input(s): CKTOTAL, CKMB, CKMBINDEX, TROPONINI in the last 168 hours. BNP (last 3 results) No results for input(s): PROBNP in the last 8760 hours. HbA1C: No results for input(s): HGBA1C in the last 72 hours. CBG: No results for  input(s): GLUCAP in the last 168 hours. Lipid Profile: No results for input(s): CHOL, HDL, LDLCALC, TRIG, CHOLHDL, LDLDIRECT in the last 72 hours. Thyroid Function Tests: No results for  input(s): TSH, T4TOTAL, FREET4, T3FREE, THYROIDAB in the last 72 hours. Anemia Panel: No results for input(s): VITAMINB12, FOLATE, FERRITIN, TIBC, IRON, RETICCTPCT in the last 72 hours. Urine analysis:    Component Value Date/Time   COLORURINE STRAW (A) 08/19/2019 1529   APPEARANCEUR CLEAR 08/19/2019 1529   LABSPEC 1.018 08/19/2019 1529   PHURINE 7.0 08/19/2019 1529   GLUCOSEU NEGATIVE 08/19/2019 1529   HGBUR NEGATIVE 08/19/2019 1529   BILIRUBINUR NEGATIVE 08/19/2019 1529   KETONESUR NEGATIVE 08/19/2019 1529   PROTEINUR NEGATIVE 08/19/2019 1529   UROBILINOGEN 0.2 02/15/2015 1926   NITRITE NEGATIVE 08/19/2019 1529   LEUKOCYTESUR NEGATIVE 08/19/2019 1529    Creatinine Clearance: Estimated Creatinine Clearance: 51.8 mL/min (by C-G formula based on SCr of 0.55 mg/dL).  Sepsis Labs: @LABRCNTIP (procalcitonin:4,lacticidven:4) ) Recent Results (from the past 240 hour(s))  Culture, blood (routine x 2)     Status: None (Preliminary result)   Collection Time: 08/19/19  1:39 PM   Specimen: Right Antecubital; Blood  Result Value Ref Range Status   Specimen Description RIGHT ANTECUBITAL  Final   Special Requests   Final    BOTTLES DRAWN AEROBIC AND ANAEROBIC Blood Culture adequate volume Performed at [facility], [address], [place], [place] [address]    Culture PENDING  Incomplete   Report Status PENDING  Incomplete  Culture, blood (routine x 2)     Status: None (Preliminary result)   Collection Time: 08/19/19  1:53 PM   Specimen: Peripheral; Blood  Result Value Ref Range Status   Specimen Description LEFT ANTECUBITAL  Final   Special Requests   Final    BOTTLES DRAWN AEROBIC AND ANAEROBIC Blood Culture adequate volume Performed at [facility], [address], [place], [place] [address]    Culture PENDING  Incomplete   Report Status PENDING  Incomplete     Radiological Exams on Admission: CT ABDOMEN PELVIS W WO CONTRAST  Result Date: 08/19/2019 CLINICAL DATA:  Abnormal  chest radiograph. Liver lesions on outside hospital CT abdomen/pelvis. EXAM: CT CHEST WITH CONTRAST CT ABDOMEN AND PELVIS WITH AND WITHOUT CONTRAST TECHNIQUE: Multidetector CT imaging of the chest was performed during intravenous contrast administration. Multidetector CT imaging of the abdomen and pelvis was performed following the standard protocol before and during bolus administration of intravenous contrast. CONTRAST:  138m OMNIPAQUE IOHEXOL 300 MG/ML  SOLN COMPARISON:  Chest radiographs dated 08/19/2019. CT abdomen/pelvis dated 09/21/2016. FINDINGS: CT CHEST FINDINGS Cardiovascular: Heart is normal in size.  No pericardial effusion. No evidence of thoracic aortic aneurysm. Atherosclerotic calcifications of the aortic arch. Mild coronary atherosclerosis the LAD and right coronary artery. Mediastinum/Nodes: 8 mm short axis low right paratracheal node (series 9/image 45). 16 mm short axis right hilar node (series 9/image 49). 10 mm short axis right perihilar node (series 9/image 42). Visualized thyroid is notable for a 5 mm right thyroid nodule (series 9/image 15). No followup recommended (ref: J Am Coll Radiol. 2015 Feb;12(2): 143-50). Lungs/Pleura: 2.7 x 3.8 x 2.6 cm spiculated right upper lobe mass (series 11/image 63), compatible with primary bronchogenic neoplasm. Mild scarring/atelectasis in the medial right middle lobe and posterior lingula. No focal consolidation. Moderate centrilobular and paraseptal emphysematous changes, upper lung predominant. Mild biapical pleural-parenchymal scarring, right greater than left. No pleural effusion or pneumothorax. Musculoskeletal: No focal osseous lesions. CT ABDOMEN AND PELVIS FINDINGS Hepatobiliary:  Numerous hepatic metastases throughout both lobes. Index lesions include: --2.2 x 2.8 cm lesion in segment 8 (series 9/image 82) --1.6 x 2.0 cm lesion in segment 2 (series 9/image 88) --3.9 x 5.1 cm lesion in segment 7 (series 9/image 89) --1.8 x 2.1 cm lesion in segment  3 (series 9/image 111) Status post cholecystectomy. No intrahepatic or extrahepatic ductal dilatation. Pancreas: Within normal limits. Spleen: Within normal limits. Adrenals/Urinary Tract: 3.2 x 3.5 cm left adrenal mass containing macroscopic fat and coarse calcifications, previously 3.3 x 3.4 cm in 2018. 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 Regardless, given stability, this can be considered benign. Stable mild nodularity right adrenal gland measuring up to 9 mm (series 9/image 90), unchanged, benign. Kidneys are within normal limits.  No hydronephrosis. Bladder is within normal limits. Stomach/Bowel: Stomach is within normal limits. No evidence of bowel obstruction. Prior appendectomy. Sigmoid diverticulosis, without evidence of diverticulitis. Vascular/Lymphatic: No evidence of abdominal aortic aneurysm. Atherosclerotic calcifications of the abdominal aorta and branch vessels. No suspicious abdominopelvic lymphadenopathy. Reproductive: Uterus is within normal limits. No adnexal masses. Other: No abdominopelvic ascites. Musculoskeletal: No focal osseous lesions. IMPRESSION: 3.8 cm spiculated right upper lobe mass, compatible with primary bronchogenic neoplasm. Associated thoracic nodal metastases, as above. Innumerable hepatic metastases in both lobes, with index lesions as above. Benign bilateral adrenal nodules/masses. Patient is scheduled for tissue sampling of the suspected hepatic metastases. PET-CT may also be beneficial for staging, as clinically warranted. Electronically Signed   By: Julian Hy M.D.   On: 08/19/2019 15:35   CT Chest W Contrast  Result Date: 08/19/2019 CLINICAL DATA:  Abnormal chest radiograph. Liver lesions on outside hospital CT abdomen/pelvis. EXAM: CT CHEST WITH CONTRAST CT ABDOMEN AND PELVIS WITH AND WITHOUT CONTRAST TECHNIQUE: Multidetector CT imaging of the chest was performed during intravenous contrast administration. Multidetector CT imaging of the abdomen  and pelvis was performed following the standard protocol before and during bolus administration of intravenous contrast. CONTRAST:  173m OMNIPAQUE IOHEXOL 300 MG/ML  SOLN COMPARISON:  Chest radiographs dated 08/19/2019. CT abdomen/pelvis dated 09/21/2016. FINDINGS: CT CHEST FINDINGS Cardiovascular: Heart is normal in size.  No pericardial effusion. No evidence of thoracic aortic aneurysm. Atherosclerotic calcifications of the aortic arch. Mild coronary atherosclerosis the LAD and right coronary artery. Mediastinum/Nodes: 8 mm short axis low right paratracheal node (series 9/image 45). 16 mm short axis right hilar node (series 9/image 49). 10 mm short axis right perihilar node (series 9/image 42). Visualized thyroid is notable for a 5 mm right thyroid nodule (series 9/image 15). No followup recommended (ref: J Am Coll Radiol. 2015 Feb;12(2): 143-50). Lungs/Pleura: 2.7 x 3.8 x 2.6 cm spiculated right upper lobe mass (series 11/image 63), compatible with primary bronchogenic neoplasm. Mild scarring/atelectasis in the medial right middle lobe and posterior lingula. No focal consolidation. Moderate centrilobular and paraseptal emphysematous changes, upper lung predominant. Mild biapical pleural-parenchymal scarring, right greater than left. No pleural effusion or pneumothorax. Musculoskeletal: No focal osseous lesions. CT ABDOMEN AND PELVIS FINDINGS Hepatobiliary: Numerous hepatic metastases throughout both lobes. Index lesions include: --2.2 x 2.8 cm lesion in segment 8 (series 9/image 82) --1.6 x 2.0 cm lesion in segment 2 (series 9/image 88) --3.9 x 5.1 cm lesion in segment 7 (series 9/image 89) --1.8 x 2.1 cm lesion in segment 3 (series 9/image 111) Status post cholecystectomy. No intrahepatic or extrahepatic ductal dilatation. Pancreas: Within normal limits. Spleen: Within normal limits. Adrenals/Urinary Tract: 3.2 x 3.5 cm left adrenal mass containing macroscopic fat and coarse calcifications, previously 3.3  x 3.4  cm in 2018. 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 Regardless, given stability, this can be considered benign. Stable mild nodularity right adrenal gland measuring up to 9 mm (series 9/image 90), unchanged, benign. Kidneys are within normal limits.  No hydronephrosis. Bladder is within normal limits. Stomach/Bowel: Stomach is within normal limits. No evidence of bowel obstruction. Prior appendectomy. Sigmoid diverticulosis, without evidence of diverticulitis. Vascular/Lymphatic: No evidence of abdominal aortic aneurysm. Atherosclerotic calcifications of the abdominal aorta and branch vessels. No suspicious abdominopelvic lymphadenopathy. Reproductive: Uterus is within normal limits. No adnexal masses. Other: No abdominopelvic ascites. Musculoskeletal: No focal osseous lesions. IMPRESSION: 3.8 cm spiculated right upper lobe mass, compatible with primary bronchogenic neoplasm. Associated thoracic nodal metastases, as above. Innumerable hepatic metastases in both lobes, with index lesions as above. Benign bilateral adrenal nodules/masses. Patient is scheduled for tissue sampling of the suspected hepatic metastases. PET-CT may also be beneficial for staging, as clinically warranted. Electronically Signed   By: Julian Hy M.D.   On: 08/19/2019 15:35   DG Chest Port 1 View  Result Date: 08/19/2019 CLINICAL DATA:  Fever, shortness of breath. EXAM: PORTABLE CHEST 1 VIEW COMPARISON:  September 21, 2016. FINDINGS: The heart size and mediastinal contours are within normal limits. No pneumothorax or pleural effusion is noted. Left lung is clear. Large spiculated mass is noted in right upper lobe concerning for malignancy; CT scan of the chest with intravenous contrast is recommended for further evaluation. The visualized skeletal structures are unremarkable. IMPRESSION: Large spiculated mass is noted in right upper lobe concerning for malignancy; CT scan of the chest with intravenous contrast is recommended for  further evaluation. Electronically Signed   By: Marijo Conception M.D.   On: 08/19/2019 13:51    EKG: Independently reviewed. Sinus, Tachy, no ACS  Assessment/Plan Active Problems:   Tobacco abuse   Essential hypertension, benign   Crohn's colitis ([name])   Lung cancer ([facility])   Chronic respiratory failure with hypoxia ([facility])   COPD with asthma ([facility])   Tachycardia   FUO (fever of unknown origin)   Lung Mass: Lung cancer until proven otherwise. Of note pt had an outpt CT scan of the abdomen and was noted to have mets throughout the liver and was set for biopsy at [facility] on 08/21/19. Given pts 50pack year history and appearance of mass, lung cancer w/ diffuse metastatic disease is the likely dx.  - Oncology consulted - Needs tissue biopsy  Fever/Malaise/RUQ pain: Elevated WBC. Nml UA, lactic acidCT scan unremarkable other than diffuse hepatic congestion from metastatic disease. No active infection noted. Suspect high metabolic rate from cancer, possible necrosis of mets from tissue burden, vs bacteremia. No evidence of rib fracture despite physical exam findings. Gallbladder nml on scan though pt w/ referred pain to R posterior shoulder.  - BCX - UCX - Ceftriaxone - IVF  Tachycardia: multifactorial secondary to dehydration, missed propranolol doses for days and febrile illness. ECG reasurring.  - Tele - IVF (1L bolus) 151m/hr - resume propranolol  Chronic pain: - continue hom Percocet - IV morphine for breakthrough given above pain.   HTN: Normotensive. On Propranolol - Propranolol, Norvasc - Hydralazine IV PRN  COPD/CRF: on 2L at baseline. No evidence of respiratory compromise.  - Continue home O2 - Continue Spiriva    DVT prophylaxis: Hep  Code Status: FULL  Family Communication: daughter in law  Disposition Plan: pending workup of fever and mass  Consults called: Oncology - pending call back   Admission  status: inpt    Waldemar Dickens MD [facility]  If 7PM-7AM,  please contact night-coverage [email or web address] Password [facility]  08/19/2019, 6:14 PM

## 2019-08-20 DIAGNOSIS — J9611 Chronic respiratory failure with hypoxia: Secondary | ICD-10-CM

## 2019-08-20 DIAGNOSIS — R160 Hepatomegaly, not elsewhere classified: Secondary | ICD-10-CM

## 2019-08-20 DIAGNOSIS — R918 Other nonspecific abnormal finding of lung field: Secondary | ICD-10-CM

## 2019-08-20 DIAGNOSIS — R509 Fever, unspecified: Secondary | ICD-10-CM

## 2019-08-20 DIAGNOSIS — C3411 Malignant neoplasm of upper lobe, right bronchus or lung: Secondary | ICD-10-CM

## 2019-08-20 DIAGNOSIS — Z720 Tobacco use: Secondary | ICD-10-CM

## 2019-08-20 LAB — SEDIMENTATION RATE: Sed Rate: 70 mm/hr — ABNORMAL HIGH (ref 0–22)

## 2019-08-20 LAB — CBC
HCT: 33 % — ABNORMAL LOW (ref 36.0–46.0)
Hemoglobin: 10.1 g/dL — ABNORMAL LOW (ref 12.0–15.0)
MCH: 26.8 pg (ref 26.0–34.0)
MCHC: 30.6 g/dL (ref 30.0–36.0)
MCV: 87.5 fL (ref 80.0–100.0)
Platelets: 455 10*3/uL — ABNORMAL HIGH (ref 150–400)
RBC: 3.77 MIL/uL — ABNORMAL LOW (ref 3.87–5.11)
RDW: 14.1 % (ref 11.5–15.5)
WBC: 16.5 10*3/uL — ABNORMAL HIGH (ref 4.0–10.5)
nRBC: 0 % (ref 0.0–0.2)

## 2019-08-20 LAB — COMPREHENSIVE METABOLIC PANEL WITH GFR
ALT: 24 U/L (ref 0–44)
AST: 44 U/L — ABNORMAL HIGH (ref 15–41)
Albumin: 2.4 g/dL — ABNORMAL LOW (ref 3.5–5.0)
Alkaline Phosphatase: 288 U/L — ABNORMAL HIGH (ref 38–126)
Anion gap: 12 (ref 5–15)
BUN: 7 mg/dL — ABNORMAL LOW (ref 8–23)
CO2: 22 mmol/L (ref 22–32)
Calcium: 8 mg/dL — ABNORMAL LOW (ref 8.9–10.3)
Chloride: 103 mmol/L (ref 98–111)
Creatinine, Ser: 0.48 mg/dL (ref 0.44–1.00)
GFR calc Af Amer: >60 mL/min
GFR calc non Af Amer: >60 mL/min
Glucose, Bld: 101 mg/dL — ABNORMAL HIGH (ref 70–99)
Potassium: 3.7 mmol/L (ref 3.5–5.1)
Sodium: 137 mmol/L (ref 135–145)
Total Bilirubin: 0.9 mg/dL (ref 0.3–1.2)
Total Protein: 5.7 g/dL — ABNORMAL LOW (ref 6.5–8.1)

## 2019-08-20 LAB — HIV ANTIBODY (ROUTINE TESTING W REFLEX): HIV Screen 4th Generation wRfx: NONREACTIVE

## 2019-08-20 LAB — PROCALCITONIN: Procalcitonin: 15.3 ng/mL

## 2019-08-20 LAB — C-REACTIVE PROTEIN: CRP: 18.8 mg/dL — ABNORMAL HIGH

## 2019-08-20 MED ORDER — OXYCODONE HCL 5 MG PO TABS
5.0000 mg | ORAL_TABLET | Freq: Three times a day (TID) | ORAL | Status: DC
  Administered 2019-08-20 – 2019-08-21 (×4): 5 mg via ORAL
  Filled 2019-08-20 (×4): qty 1

## 2019-08-20 MED ORDER — ENSURE ENLIVE PO LIQD
237.0000 mL | Freq: Two times a day (BID) | ORAL | Status: DC
  Administered 2019-08-20 – 2019-08-21 (×2): 237 mL via ORAL

## 2019-08-20 MED ORDER — ADULT MULTIVITAMIN W/MINERALS CH
1.0000 | ORAL_TABLET | Freq: Every day | ORAL | Status: DC
  Administered 2019-08-20 – 2019-08-21 (×2): 1 via ORAL
  Filled 2019-08-20 (×2): qty 1

## 2019-08-20 NOTE — Consult Note (Signed)
[facility] Consultation Oncology  Name: Stacey Nash      MRN: [id number]    Location: A311/A311-01  Date: 08/20/2019 Time:4:58 PM   REFERRING PHYSICIAN: Dr. Manuella Ghazi  REASON FOR CONSULT: Lung and liver masses   DIAGNOSIS: Most likely metastatic lung cancer.  HISTORY OF PRESENT ILLNESS: Stacey Nash is a 69 year old very pleasant white female who is seen in consultation today at the request of Dr. Manuella Ghazi for further work-up and management of lung and liver masses.  She was recently in [facility] where they found abnormal findings in the liver on the scan.  She was scheduled for an outpatient biopsy by Dr. Nevada Crane of the liver on Friday.  However she had a fever yesterday of 100.4.  She called Dr. Juel Burrow office and was told to come to the ER.  She had a CT of the chest, abdomen and pelvis with and without contrast on 08/19/2019 which showed 2.7 x 3.8 x 2.6 cm spiculated right upper lobe mass compatible with primary bronchogenic neoplasm.  8 mm short axis low right paratracheal node, 16 mm short axis right hilar node and 10 mm short axis right perihilar node.  Multiple liver masses were seen in both lobes, largest measuring 3.9 x 5.1 cm.  Left adrenal mass measuring 3.2 x 3.5 cm with macroscopic fat and coarse calcifications consistent with adrenal teratoma.  No focal bone metastasis.  She does report low-grade fevers of 100.4 for the past 2 weeks associated with chills.  She also reported decreased appetite for last 2 months, lost about 10 pounds.  Has on and off nausea.  She lives at home with her son.  She is a retired Public house manager.  She smoked 1 pack/day for at least 40 years and quit smoking 6 years ago.  No family history of malignancy.  She is on oxygen at home for the past few years.  She is able to do all her day-to-day activities at home including cooking and cleaning.  She had rib fracture once or twice secondary to osteoporosis.  Denies any headaches or vision changes.  PAST MEDICAL  HISTORY:   Past Medical History:  Diagnosis Date  . Allergy or intolerance to drug    Doxycycline caused worsening shortness of breath and loose stools.  . Asthma   . Chronic neck pain   . COPD (chronic obstructive pulmonary disease) ([place])   . Crohn's colitis ([place]) 10/03/2018  . Diverticula of intestine   . Hypertension   . Left adrenal mass ([place])   . Osteoporosis   . Tobacco abuse     ALLERGIES: Allergies  Allergen Reactions  . Sulfa Antibiotics Shortness Of Breath  . Tetanus Toxoids Rash  . Doxycycline Diarrhea  . Statins     Leg pain and weakness  . Zofran [Ondansetron Hcl] Hives and Rash    Rash at injection site that spread immediately following administration        MEDICATIONS: I have reviewed the patient's current medications.     PAST SURGICAL HISTORY Past Surgical History:  Procedure Laterality Date  . APPENDECTOMY     2013  . BIOPSY  10/01/2017   Procedure: BIOPSY;  Surgeon: Rogene Houston, MD;  Location: [facility] END[facility];  Service: Endoscopy;;  left and right colon  . CHOLECYSTECTOMY    . COLONOSCOPY WITH PROPOFOL N/A 10/01/2017   Procedure: COLONOSCOPY WITH PROPOFOL;  Surgeon: Rogene Houston, MD;  Location: [facility] END[facility] SU[facility];  Service: Endoscopy;  Laterality: N/A;  . ESOPHAGOGASTRODUODENOSCOPY  N/A 09/09/2014   Procedure: ESOPHAGOGASTRODUODENOSCOPY (EGD);  Surgeon: Rogene Houston, MD;  Location: [facility];  Service: Endoscopy;  Laterality: N/A;  210 - moved to 9:10 - Ann to notify pt  . EYE SURGERY     cataract/implants  . LAPAROSCOPIC APPENDECTOMY  07/14/2011   Procedure: APPENDECTOMY LAPAROSCOPIC;  Surgeon: Donato Heinz, MD;  Location: [facility] ORS;  Service: General;  Laterality: N/A;  . POLYPECTOMY  10/01/2017   Procedure: POLYPECTOMY;  Surgeon: Rogene Houston, MD;  Location: [facility];  Service: Endoscopy;;  rectal    FAMILY HISTORY: Family History  Problem Relation Age of Onset  . Hypertension Mother   . Thyroid disease Mother   . Diabetes Father    . Alcoholism Father   . Crohn's disease Brother   . Hypertension Son     SOCIAL HISTORY:  reports that she quit smoking about 5 years ago. Her smoking use included cigarettes. She has a 100.00 pack-year smoking history. She quit smokeless tobacco use about 5 years ago. She reports that she does not drink alcohol or use drugs.  PERFORMANCE STATUS: The patient's performance status is 2 - Symptomatic, <50% confined to bed  PHYSICAL EXAM: Most Recent Vital Signs: Blood pressure (!) 141/76, pulse 90, temperature 98 F (36.7 C), temperature source Oral, resp. rate 18, height 5' 2"  (1.575 m), weight 113 lb 8.6 oz (51.5 kg), SpO2 97 %. BP (!) 141/76 (BP Location: Left Arm)   Pulse 90   Temp 98 F (36.7 C) (Oral)   Resp 18   Ht 5' 2"  (1.575 m)   Wt 113 lb 8.6 oz (51.5 kg)   SpO2 97%   BMI 20.77 kg/m  General appearance: alert, cooperative and appears stated age Head: Normocephalic, without obvious abnormality, atraumatic Neck: no adenopathy, supple, symmetrical, trachea midline and thyroid not enlarged, symmetric, no tenderness/mass/nodules Lungs: Bilateral air entry. Heart: regular rate and rhythm Abdomen: soft, non-tender; bowel sounds normal; no masses,  no organomegaly Extremities: extremities normal, atraumatic, no cyanosis or edema Skin: Skin color, texture, turgor normal. No rashes or lesions Lymph nodes: Cervical, supraclavicular, and axillary nodes normal. Neurologic: Grossly normal  LABORATORY DATA:  Results for orders placed or performed during the hospital encounter of 08/19/19 (from the past 48 hour(s))  Culture, blood (routine x 2)     Status: None (Preliminary result)   Collection Time: 08/19/19  1:39 PM   Specimen: Right Antecubital; Blood  Result Value Ref Range   Specimen Description RIGHT ANTECUBITAL    Special Requests      BOTTLES DRAWN AEROBIC AND ANAEROBIC Blood Culture adequate volume   Culture      NO GROWTH < 24 HOURS Performed at [facility],  [address], [place], [place] [address]    Report Status PENDING   SARS CORONAVIRUS 2 (TAT 6-24 HRS) Nasopharyngeal Nasopharyngeal Swab     Status: None   Collection Time: 08/19/19  1:39 PM   Specimen: Nasopharyngeal Swab  Result Value Ref Range   SARS Coronavirus 2 NEGATIVE NEGATIVE    Comment: (NOTE) SARS-CoV-2 target nucleic acids are NOT DETECTED. The SARS-CoV-2 RNA is generally detectable in upper and lower respiratory specimens during the acute phase of infection. Negative results do not preclude SARS-CoV-2 infection, do not rule out co-infections with other pathogens, and should not be used as the sole basis for treatment or other patient management decisions. Negative results must be combined with clinical observations, patient history, and epidemiological information. The expected result is Negative.  Fact Sheet for Patients: SugarRoll.be Fact Sheet for Healthcare Providers: https://www.woods-mathews.com/ This test is not yet approved or cleared by the [place] FDA and  has been authorized for detection and/or diagnosis of SARS-CoV-2 by FDA under an Emergency Use Authorization (EUA). This EUA will remain  in effect (meaning this test can be used) for the duration of the COVID-19 declaration under Section 56 4(b)(1) of the Act, 21 U.S.C. section 360bbb-3(b)(1), unless the authorization is terminated or revoked sooner. Performed at [facility], [place] [address] [place], [place] [address]   Comprehensive metabolic panel     Status: Abnormal   Collection Time: 08/19/19  1:40 PM  Result Value Ref Range   Sodium 134 (L) 135 - 145 mmol/L   Potassium 3.7 3.5 - 5.1 mmol/L   Chloride 101 98 - 111 mmol/L   CO2 22 22 - 32 mmol/L   Glucose, Bld 136 (H) 70 - 99 mg/dL    Comment: Glucose reference range applies only to samples taken after fasting for at least 8 hours.   BUN 9 8 - 23 mg/dL   Creatinine, Ser 0.55 0.44 - 1.00 mg/dL    Calcium 8.3 (L) 8.9 - 10.3 mg/dL   Total Protein 6.6 6.5 - 8.1 g/dL   Albumin 2.8 (L) 3.5 - 5.0 g/dL   AST 60 (H) 15 - 41 U/L   ALT 30 0 - 44 U/L   Alkaline Phosphatase 361 (H) 38 - 126 U/L   Total Bilirubin 1.2 0.3 - 1.2 mg/dL   GFR calc non Af Amer >60 >60 mL/min   GFR calc Af Amer >60 >60 mL/min   Anion gap 11 5 - 15    Comment: Performed at [facility], [address], [place], [place] [address]  CBC with Differential     Status: Abnormal   Collection Time: 08/19/19  1:40 PM  Result Value Ref Range   WBC 16.9 (H) 4.0 - 10.5 K/uL   RBC 4.38 3.87 - 5.11 MIL/uL   Hemoglobin 12.1 12.0 - 15.0 g/dL   HCT 38.2 36.0 - 46.0 %   MCV 87.2 80.0 - 100.0 fL   MCH 27.6 26.0 - 34.0 pg   MCHC 31.7 30.0 - 36.0 g/dL   RDW 14.0 11.5 - 15.5 %   Platelets 475 (H) 150 - 400 K/uL   nRBC 0.0 0.0 - 0.2 %   Neutrophils Relative % 83 %   Neutro Abs 14.2 (H) 1.7 - 7.7 K/uL   Lymphocytes Relative 6 %   Lymphs Abs 1.0 0.7 - 4.0 K/uL   Monocytes Relative 9 %   Monocytes Absolute 1.4 (H) 0.1 - 1.0 K/uL   Eosinophils Relative 0 %   Eosinophils Absolute 0.0 0.0 - 0.5 K/uL   Basophils Relative 1 %   Basophils Absolute 0.1 0.0 - 0.1 K/uL   Immature Granulocytes 1 %   Abs Immature Granulocytes 0.12 (H) 0.00 - 0.07 K/uL    Comment: Performed at [facility], [address], [place], [place] [address]  Protime-INR     Status: Abnormal   Collection Time: 08/19/19  1:40 PM  Result Value Ref Range   Prothrombin Time 15.6 (H) 11.4 - 15.2 seconds   INR 1.3 (H) 0.8 - 1.2    Comment: (NOTE) INR goal varies based on device and disease states. Performed at [facility], [address] [place], [place] [address]   Lactic acid, plasma     Status: None   Collection Time: 08/19/19  1:40 PM  Result Value Ref Range   Lactic Acid, Venous 1.6 0.5 - 1.9 mmol/L    Comment: Performed at [facility], [address] [place], [place] [address]  Culture, blood (routine x 2)     Status: None (Preliminary result)   Collection  Time: 08/19/19  1:53 PM   Specimen: Left Antecubital; Blood  Result Value Ref Range   Specimen Description LEFT ANTECUBITAL    Special Requests      BOTTLES DRAWN AEROBIC AND ANAEROBIC Blood Culture adequate volume   Culture      NO GROWTH < 24 HOURS Performed at [facility], [address], [place], [place] [address]    Report Status PENDING   Urinalysis, Routine w reflex microscopic     Status: Abnormal   Collection Time: 08/19/19  3:29 PM  Result Value Ref Range   Color, Urine STRAW (A) YELLOW   APPearance CLEAR CLEAR   Specific Gravity, Urine 1.018 1.005 - 1.030   pH 7.0 5.0 - 8.0   Glucose, UA NEGATIVE NEGATIVE mg/dL   Hgb urine dipstick NEGATIVE NEGATIVE   Bilirubin Urine NEGATIVE NEGATIVE   Ketones, ur NEGATIVE NEGATIVE mg/dL   Protein, ur NEGATIVE NEGATIVE mg/dL   Nitrite NEGATIVE NEGATIVE   Leukocytes,Ua NEGATIVE NEGATIVE    Comment: Performed at [facility], [address] [place], [place] [address]  HIV Antibody (routine testing w rflx)     Status: None   Collection Time: 08/20/19  4:55 AM  Result Value Ref Range   HIV Screen 4th Generation wRfx NON REACTIVE NON REACTIVE    Comment: Performed at [facility] [address], [place],  [address]  CBC     Status: Abnormal   Collection Time: 08/20/19  4:55 AM  Result Value Ref Range   WBC 16.5 (H) 4.0 - 10.5 K/uL   RBC 3.77 (L) 3.87 - 5.11 MIL/uL   Hemoglobin 10.1 (L) 12.0 - 15.0 g/dL   HCT 33.0 (L) 36.0 - 46.0 %   MCV 87.5 80.0 - 100.0 fL   MCH 26.8 26.0 - 34.0 pg   MCHC 30.6 30.0 - 36.0 g/dL   RDW 14.1 11.5 - 15.5 %   Platelets 455 (H) 150 - 400 K/uL   nRBC 0.0 0.0 - 0.2 %    Comment: Performed at [facility], [address], [place],  [address]  Comprehensive metabolic panel     Status: Abnormal   Collection Time: 08/20/19  4:55 AM  Result Value Ref Range   Sodium 137 135 - 145 mmol/L   Potassium 3.7 3.5 - 5.1 mmol/L   Chloride 103 98 - 111 mmol/L   CO2 22 22 - 32 mmol/L   Glucose,  Bld 101 (H) 70 - 99 mg/dL    Comment: Glucose reference range applies only to samples taken after fasting for at least 8 hours.   BUN 7 (L) 8 - 23 mg/dL   Creatinine, Ser 0.48 0.44 - 1.00 mg/dL   Calcium 8.0 (L) 8.9 - 10.3 mg/dL   Total Protein 5.7 (L) 6.5 - 8.1 g/dL   Albumin 2.4 (L) 3.5 - 5.0 g/dL   AST 44 (H) 15 - 41 U/L   ALT 24 0 - 44 U/L   Alkaline Phosphatase 288 (H) 38 - 126 U/L   Total Bilirubin 0.9 0.3 - 1.2 mg/dL   GFR calc non Af Amer >60 >60 mL/min   GFR calc Af Amer >60 >60 mL/min   Anion gap 12  5 - 15    Comment: Performed at [facility], [address], [place], [place] [address]  Sedimentation rate     Status: Abnormal   Collection Time: 08/20/19 12:03 PM  Result Value Ref Range   Sed Rate 70 (H) 0 - 22 mm/hr    Comment: Performed at [facility], [address] [place], [place] [address]  Procalcitonin - Baseline     Status: None   Collection Time: 08/20/19 12:03 PM  Result Value Ref Range   Procalcitonin 15.30 ng/mL    Comment:        Interpretation: PCT >= 10 ng/mL: Important systemic inflammatory response, almost exclusively due to severe bacterial sepsis or septic shock. (NOTE)       Sepsis PCT Algorithm           Lower Respiratory Tract                                      Infection PCT Algorithm    ----------------------------     ----------------------------         PCT < 0.25 ng/mL                PCT < 0.10 ng/mL         Strongly encourage             Strongly discourage   discontinuation of antibiotics    initiation of antibiotics    ----------------------------     -----------------------------       PCT 0.25 - 0.50 ng/mL            PCT 0.10 - 0.25 ng/mL               OR       >80% decrease in PCT            Discourage initiation of                                            antibiotics      Encourage discontinuation           of antibiotics    ----------------------------     -----------------------------         PCT >= 0.50 ng/mL               PCT 0.26 - 0.50 ng/mL                AND       <80% decrease in PCT             Encourage initiation of                                             antibiotics       Encourage continuation           of antibiotics    ----------------------------     -----------------------------        PCT >= 0.50 ng/mL                  PCT > 0.50 ng/mL               AND  increase in PCT                  Strongly encourage                                      initiation of antibiotics    Strongly encourage escalation           of antibiotics                                     -----------------------------                                           PCT <= 0.25 ng/mL                                                 OR                                        > 80% decrease in PCT                                     Discontinue / Do not initiate                                             antibiotics Performed at [facility], [address], [place], [place] [address]   C-reactive protein     Status: Abnormal   Collection Time: 08/20/19 12:30 PM  Result Value Ref Range   CRP 18.8 (H) <1.0 mg/dL    Comment: Performed at [facility], [address] [place], [place] [address]      RADIOGRAPHY: CT ABDOMEN PELVIS W WO CONTRAST  Result Date: 08/19/2019 CLINICAL DATA:  Abnormal chest radiograph. Liver lesions on outside hospital CT abdomen/pelvis. EXAM: CT CHEST WITH CONTRAST CT ABDOMEN AND PELVIS WITH AND WITHOUT CONTRAST TECHNIQUE: Multidetector CT imaging of the chest was performed during intravenous contrast administration. Multidetector CT imaging of the abdomen and pelvis was performed following the standard protocol before and during bolus administration of intravenous contrast. CONTRAST:  174m OMNIPAQUE IOHEXOL 300 MG/ML  SOLN COMPARISON:  Chest radiographs dated 08/19/2019. CT abdomen/pelvis dated 09/21/2016. FINDINGS: CT CHEST FINDINGS Cardiovascular: Heart is normal in size.  No pericardial  effusion. No evidence of thoracic aortic aneurysm. Atherosclerotic calcifications of the aortic arch. Mild coronary atherosclerosis the LAD and right coronary artery. Mediastinum/Nodes: 8 mm short axis low right paratracheal node (series 9/image 45). 16 mm short axis right hilar node (series 9/image 49). 10 mm short axis right perihilar node (series 9/image 42). Visualized thyroid is notable for a 5 mm right thyroid nodule (series 9/image 15). No followup recommended (ref: J Am Coll Radiol. 2015 Feb;12(2): 143-50). Lungs/Pleura: 2.7 x 3.8 x 2.6 cm spiculated right upper lobe mass (series 11/image  32), compatible with primary bronchogenic neoplasm. Mild scarring/atelectasis in the medial right middle lobe and posterior lingula. No focal consolidation. Moderate centrilobular and paraseptal emphysematous changes, upper lung predominant. Mild biapical pleural-parenchymal scarring, right greater than left. No pleural effusion or pneumothorax. Musculoskeletal: No focal osseous lesions. CT ABDOMEN AND PELVIS FINDINGS Hepatobiliary: Numerous hepatic metastases throughout both lobes. Index lesions include: --2.2 x 2.8 cm lesion in segment 8 (series 9/image 82) --1.6 x 2.0 cm lesion in segment 2 (series 9/image 88) --3.9 x 5.1 cm lesion in segment 7 (series 9/image 89) --1.8 x 2.1 cm lesion in segment 3 (series 9/image 111) Status post cholecystectomy. No intrahepatic or extrahepatic ductal dilatation. Pancreas: Within normal limits. Spleen: Within normal limits. Adrenals/Urinary Tract: 3.2 x 3.5 cm left adrenal mass containing macroscopic fat and coarse calcifications, previously 3.3 x 3.4 cm in 2018. 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 Regardless, given stability, this can be considered benign. Stable mild nodularity right adrenal gland measuring up to 9 mm (series 9/image 90), unchanged, benign. Kidneys are within normal limits.  No hydronephrosis. Bladder is within normal limits. Stomach/Bowel: Stomach is  within normal limits. No evidence of bowel obstruction. Prior appendectomy. Sigmoid diverticulosis, without evidence of diverticulitis. Vascular/Lymphatic: No evidence of abdominal aortic aneurysm. Atherosclerotic calcifications of the abdominal aorta and branch vessels. No suspicious abdominopelvic lymphadenopathy. Reproductive: Uterus is within normal limits. No adnexal masses. Other: No abdominopelvic ascites. Musculoskeletal: No focal osseous lesions. IMPRESSION: 3.8 cm spiculated right upper lobe mass, compatible with primary bronchogenic neoplasm. Associated thoracic nodal metastases, as above. Innumerable hepatic metastases in both lobes, with index lesions as above. Benign bilateral adrenal nodules/masses. Patient is scheduled for tissue sampling of the suspected hepatic metastases. PET-CT may also be beneficial for staging, as clinically warranted. Electronically Signed   By: Julian Hy M.D.   On: 08/19/2019 15:35   CT Chest W Contrast  Result Date: 08/19/2019 CLINICAL DATA:  Abnormal chest radiograph. Liver lesions on outside hospital CT abdomen/pelvis. EXAM: CT CHEST WITH CONTRAST CT ABDOMEN AND PELVIS WITH AND WITHOUT CONTRAST TECHNIQUE: Multidetector CT imaging of the chest was performed during intravenous contrast administration. Multidetector CT imaging of the abdomen and pelvis was performed following the standard protocol before and during bolus administration of intravenous contrast. CONTRAST:  111m OMNIPAQUE IOHEXOL 300 MG/ML  SOLN COMPARISON:  Chest radiographs dated 08/19/2019. CT abdomen/pelvis dated 09/21/2016. FINDINGS: CT CHEST FINDINGS Cardiovascular: Heart is normal in size.  No pericardial effusion. No evidence of thoracic aortic aneurysm. Atherosclerotic calcifications of the aortic arch. Mild coronary atherosclerosis the LAD and right coronary artery. Mediastinum/Nodes: 8 mm short axis low right paratracheal node (series 9/image 45). 16 mm short axis right hilar node (series  9/image 49). 10 mm short axis right perihilar node (series 9/image 42). Visualized thyroid is notable for a 5 mm right thyroid nodule (series 9/image 15). No followup recommended (ref: J Am Coll Radiol. 2015 Feb;12(2): 143-50). Lungs/Pleura: 2.7 x 3.8 x 2.6 cm spiculated right upper lobe mass (series 11/image 63), compatible with primary bronchogenic neoplasm. Mild scarring/atelectasis in the medial right middle lobe and posterior lingula. No focal consolidation. Moderate centrilobular and paraseptal emphysematous changes, upper lung predominant. Mild biapical pleural-parenchymal scarring, right greater than left. No pleural effusion or pneumothorax. Musculoskeletal: No focal osseous lesions. CT ABDOMEN AND PELVIS FINDINGS Hepatobiliary: Numerous hepatic metastases throughout both lobes. Index lesions include: --2.2 x 2.8 cm lesion in segment 8 (series 9/image 82) --1.6 x 2.0 cm lesion in segment 2 (series 9/image 88) --3.9 x 5.1  cm lesion in segment 7 (series 9/image 89) --1.8 x 2.1 cm lesion in segment 3 (series 9/image 111) Status post cholecystectomy. No intrahepatic or extrahepatic ductal dilatation. Pancreas: Within normal limits. Spleen: Within normal limits. Adrenals/Urinary Tract: 3.2 x 3.5 cm left adrenal mass containing macroscopic fat and coarse calcifications, previously 3.3 x 3.4 cm in 2018. 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 Regardless, given stability, this can be considered benign. Stable mild nodularity right adrenal gland measuring up to 9 mm (series 9/image 90), unchanged, benign. Kidneys are within normal limits.  No hydronephrosis. Bladder is within normal limits. Stomach/Bowel: Stomach is within normal limits. No evidence of bowel obstruction. Prior appendectomy. Sigmoid diverticulosis, without evidence of diverticulitis. Vascular/Lymphatic: No evidence of abdominal aortic aneurysm. Atherosclerotic calcifications of the abdominal aorta and branch vessels. No suspicious  abdominopelvic lymphadenopathy. Reproductive: Uterus is within normal limits. No adnexal masses. Other: No abdominopelvic ascites. Musculoskeletal: No focal osseous lesions. IMPRESSION: 3.8 cm spiculated right upper lobe mass, compatible with primary bronchogenic neoplasm. Associated thoracic nodal metastases, as above. Innumerable hepatic metastases in both lobes, with index lesions as above. Benign bilateral adrenal nodules/masses. Patient is scheduled for tissue sampling of the suspected hepatic metastases. PET-CT may also be beneficial for staging, as clinically warranted. Electronically Signed   By: Julian Hy M.D.   On: 08/19/2019 15:35   DG Chest Port 1 View  Result Date: 08/19/2019 CLINICAL DATA:  Fever, shortness of breath. EXAM: PORTABLE CHEST 1 VIEW COMPARISON:  September 21, 2016. FINDINGS: The heart size and mediastinal contours are within normal limits. No pneumothorax or pleural effusion is noted. Left lung is clear. Large spiculated mass is noted in right upper lobe concerning for malignancy; CT scan of the chest with intravenous contrast is recommended for further evaluation. The visualized skeletal structures are unremarkable. IMPRESSION: Large spiculated mass is noted in right upper lobe concerning for malignancy; CT scan of the chest with intravenous contrast is recommended for further evaluation. Electronically Signed   By: Marijo Conception M.D.   On: 08/19/2019 13:51         ASSESSMENT and PLAN:  1.  Presumed metastatic lung cancer: -Patient is a 40-pack-year smoker, quit 6 years ago. -She lives at home with her son.  She is independent of ADLs prior to presentation. -She is oxygen dependent for the past few years. -Reported 10 pound weight loss in the last 2 months with nausea and decreased eating.  Also reported pain in the right upper quadrant and right lower lateral ribs for the last 1 to 2 months. -She is scheduled by Dr. Nevada Crane for outpatient biopsy on Friday of the liver  mass.  This was found on the scan done in [facility] recently. -Patient came in yesterday with fever and chills.  CT scan was not suggestive of any infection.  UA was negative.  Blood cultures were negative so far. -I think fevers are tumor related. -Presuming it is a metastatic lung cancer, I have recommended doing an MRI of the brain with and without gadolinium. -She reported that she may not be comfortable lying down while doing MRI for 20 minutes.  I have recommended Xanax 1 hour prior to the scan. -We talked in general about her possible diagnosis.  However more definitive information about actual diagnosis and prognosis will be discussed with the patient after the biopsy result is available. -At the time of discharge, please make a follow-up visit in our clinic either Wednesday or Thursday next week. -Patient's daughter-in-law was  also at bedside.  2.  Hypertension: -She is on Norvasc and propranolol.  3.  COPD: -She is on 2 L at baseline continuously. -Continue Spiriva.  4.  Fever with leukocytosis: -No clear sign of infection.  Cultures so far negative.  No sign of infection on the CT scan. -Likely tumor related fever.  Reports having fever at home for 2 weeks.  All questions were answered. The patient knows to call the clinic with any problems, questions or concerns. We can certainly see the patient much sooner if necessary.   [name]

## 2019-08-20 NOTE — Progress Notes (Signed)
PROGRESS NOTE    [name]  OIN:[id number] DOB: Mar 28, 1951 DOA: 08/19/2019 PCP: Celene Squibb, MD   Brief Narrative:  Per HPI: Stacey Nash is a 69 y.o. female with medical history significant of O2 dependent COPD, 50pack year smoker, Crohn's, HTN, SVT.   Pt presents at the request of PCP after several days of fevers, anorexia, general malaise, and R side and chest wall pain. Pt also w/ 10lb unintentional wt loss over the last few weeks. Has had broken ribs before and feels the pain is the same although no increased pain w/ inspiration. Pain is worse w/ certain movement. No change in BM habits. No COVID exposure. No cough. Pt states she quit smoking 6 years ago but has a 50pack year smoking history.   3/24: Patient has been admitted with complaints of fever as well as leukocytosis in the setting of right upper quadrant abdominal pain that appears to be related to hepatic congestion from metastatic disease.  Blood and urine cultures pending and patient started on Rocephin as well as some IV fluid.  Discussed case with oncology and appreciate evaluation.  Also appreciate palliative care evaluation to determine goals of care.  Discussed case with radiology coordinator with plans for either outpatient liver biopsy versus inpatient by 3/26 depending on clinical course.  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 Delton Coombes -Appreciate palliative care consultation to determine goals of care  Mild transaminitis-downtrending -Likely secondary to above -Continue monitoring  Tachycardia-multifactorial -Discontinue IV fluid as patient has received quite a bit and does not appear dehydrated -Continue telemetry monitoring with propranolol  Chronic pain -Home Percocet scheduled every 8 hours for now -Much of the pain appears to be related to metastatic disease and prior history of broken ribs  Hypertension-controlled -Continue on propranolol and Norvasc  COPD with chronic hypoxemia -Wears 2 L oxygen at baseline -Continue on Spiriva with albuterol breathing treatments as needed   DVT prophylaxis: Heparin Code Status: Full Family Communication: Plan to call daughter-in-law Disposition Plan: Continue ongoing IV Rocephin and follow blood cultures with close monitoring of fever and leukocytosis as well as other labs as ordered.  Appreciate oncology and palliative care evaluation.   Consultants:   Oncology  Palliative care  Procedures:   See below  Antimicrobials:  Anti-infectives (From admission, onward)   Start     Dose/Rate Route Frequency Ordered Stop   08/19/19 1830  cefTRIAXone (ROCEPHIN) 1 g in sodium chloride 0.9 % 100 mL IVPB     1 g 200 mL/hr over 30 Minutes Intravenous Every 24 hours 08/19/19 1806         Subjective: Patient seen and evaluated today with no new acute complaints or concerns. No acute concerns or events noted overnight.  She is complaining of some pain in her lower ribs and back  this morning.  Objective: Vitals:   08/19/19 2004 08/19/19 2023 08/20/19 0024 08/20/19 0431  BP:  (!) 134/55 120/63 133/67  Pulse:  97 95 (!) 112  Resp:  16 16 16   Temp:  98.2 F (36.8 C) 98.2 F (36.8 C) 99 F (37.2 C)  TempSrc:  Oral Oral Oral   SpO2:  98% 99% 97%  Weight: 51.5 kg     Height: 5' 2"  (1.575 m)       Intake/Output Summary (Last 24 hours) at 08/20/2019 1059 Last data filed at 08/19/2019 1530 Gross per 24 hour  Intake 500 ml  Output --  Net 500 ml   Filed Weights   08/19/19 1306 08/19/19 2004  Weight: 56.7 kg 51.5 kg    Examination:  General exam: Appears calm and comfortable, frail appearing Respiratory system: Clear to auscultation. Respiratory effort normal.  Currently on 2 L nasal cannula oxygen. Cardiovascular system: S1 & S2 heard, RRR. No JVD, murmurs, rubs, gallops or clicks. No pedal edema. Gastrointestinal system: Abdomen is nondistended, soft and nontender. No organomegaly or masses felt. Normal bowel sounds heard. Central nervous system: Alert and awake Extremities: Symmetric 5 x 5 power. Skin: No rashes, lesions or ulcers Psychiatry: Flat affect.    Data Reviewed: I have personally reviewed following labs and imaging studies  CBC: Recent Labs  Lab 08/19/19 1340 08/20/19 0455  WBC 16.9* 16.5*  NEUTROABS 14.2*  --   HGB 12.1 10.1*  HCT 38.2 33.0*  MCV 87.2 87.5  PLT 475* 097*   Basic Metabolic Panel: Recent Labs  Lab 08/19/19 1340 08/20/19 0455  NA 134* 137  K 3.7 3.7  CL 101 103  CO2 22 22  GLUCOSE 136* 101*  BUN 9 7*  CREATININE 0.55 0.48  CALCIUM 8.3* 8.0*   GFR: Estimated Creatinine Clearance: 51.8 mL/min (by C-G formula based on SCr of 0.48 mg/dL). Liver Function Tests: Recent Labs  Lab 08/19/19 1340 08/20/19 0455  AST 60* 44*  ALT 30 24  ALKPHOS 361* 288*  BILITOT 1.2 0.9  PROT 6.6 5.7*  ALBUMIN 2.8* 2.4*   No results for input(s): LIPASE, AMYLASE in the last 168 hours. No results for input(s): AMMONIA in the last 168 hours. Coagulation Profile: Recent Labs  Lab 08/19/19 1340  INR 1.3*   Cardiac Enzymes: No results for input(s): CKTOTAL, CKMB, CKMBINDEX, TROPONINI in the last 168 hours. BNP (last 3 results) No results for input(s): PROBNP in the  last 8760 hours. HbA1C: No results for input(s): HGBA1C in the last 72 hours. CBG: No results for input(s): GLUCAP in the last 168 hours. Lipid Profile: No results for input(s): CHOL, HDL, LDLCALC, TRIG, CHOLHDL, LDLDIRECT in the last 72 hours. Thyroid Function Tests: No results for input(s): TSH, T4TOTAL, FREET4, T3FREE, THYROIDAB in the last 72 hours. Anemia Panel: No results for input(s): VITAMINB12, FOLATE, FERRITIN, TIBC, IRON, RETICCTPCT in the last 72 hours. Sepsis Labs: Recent Labs  Lab 08/19/19 1340  LATICACIDVEN 1.6    Recent Results (from the past 240 hour(s))  Culture, blood (routine x 2)     Status: None (Preliminary result)   Collection Time: 08/19/19  1:39 PM   Specimen: Right Antecubital; Blood  Result Value Ref Range Status   Specimen Description RIGHT ANTECUBITAL  Final   Special Requests   Final    BOTTLES DRAWN AEROBIC AND ANAEROBIC Blood Culture adequate volume   Culture   Final    NO GROWTH < 24 HOURS Performed at [facility], [place]  [address], [place], [place] [address]    Report Status PENDING  Incomplete  SARS CORONAVIRUS 2 (TAT 6-24 HRS) Nasopharyngeal Nasopharyngeal Swab     Status: None   Collection Time: 08/19/19  1:39 PM   Specimen: Nasopharyngeal Swab  Result Value Ref Range Status   SARS Coronavirus 2 NEGATIVE NEGATIVE Final    Comment: (NOTE) SARS-CoV-2 target nucleic acids are NOT DETECTED. The SARS-CoV-2 RNA is generally detectable in upper and lower respiratory specimens during the acute phase of infection. Negative results do not preclude SARS-CoV-2 infection, do not rule out co-infections with other pathogens, and should not be used as the sole basis for treatment or other patient management decisions. Negative results must be combined with clinical observations, patient history, and epidemiological information. The expected result is Negative. Fact Sheet for Patients: SugarRoll.be Fact Sheet for  Healthcare Providers: https://www.woods-mathews.com/ This test is not yet approved or cleared by the [place] FDA and  has been authorized for detection and/or diagnosis of SARS-CoV-2 by FDA under an Emergency Use Authorization (EUA). This EUA will remain  in effect (meaning this test can be used) for the duration of the COVID-19 declaration under Section 56 4(b)(1) of the Act, 21 U.S.C. section 360bbb-3(b)(1), unless the authorization is terminated or revoked sooner. Performed at [facility], [place] [address], [place],  [place] [address]   Culture, blood (routine x 2)     Status: None (Preliminary result)   Collection Time: 08/19/19  1:53 PM   Specimen: Left Antecubital; Blood  Result Value Ref Range Status   Specimen Description LEFT ANTECUBITAL  Final   Special Requests   Final    BOTTLES DRAWN AEROBIC AND ANAEROBIC Blood Culture adequate volume   Culture   Final    NO GROWTH < 24 HOURS Performed at [facility], [address] [place], [place] [address]    Report Status PENDING  Incomplete         Radiology Studies: CT ABDOMEN PELVIS W WO CONTRAST  Result Date: 08/19/2019 CLINICAL DATA:  Abnormal chest radiograph. Liver lesions on outside hospital CT abdomen/pelvis. EXAM: CT CHEST WITH CONTRAST CT ABDOMEN AND PELVIS WITH AND WITHOUT CONTRAST TECHNIQUE: Multidetector CT imaging of the chest was performed during intravenous contrast administration. Multidetector CT imaging of the abdomen and pelvis was performed following the standard protocol before and during bolus administration of intravenous contrast. CONTRAST:  127m OMNIPAQUE IOHEXOL 300 MG/ML  SOLN COMPARISON:  Chest radiographs dated 08/19/2019. CT abdomen/pelvis dated 09/21/2016. FINDINGS: CT CHEST FINDINGS Cardiovascular: Heart is normal in size.  No pericardial effusion. No evidence of thoracic aortic aneurysm. Atherosclerotic calcifications of the aortic arch. Mild coronary atherosclerosis the LAD  and right coronary artery. Mediastinum/Nodes: 8 mm short axis low right paratracheal node (series 9/image 45). 16 mm short axis right hilar node (series 9/image 49). 10 mm short axis right perihilar node (series 9/image 42). Visualized thyroid is notable for a 5 mm right thyroid nodule (series 9/image 15). No followup recommended (ref: J Am Coll Radiol. 2015 Feb;12(2): 143-50). Lungs/Pleura: 2.7 x 3.8 x 2.6 cm spiculated right upper lobe mass (series 11/image 63), compatible with primary bronchogenic neoplasm. Mild scarring/atelectasis in the medial right middle lobe and posterior lingula. No focal consolidation. Moderate centrilobular and paraseptal emphysematous changes, upper lung predominant. Mild biapical pleural-parenchymal scarring, right greater than left. No pleural effusion or pneumothorax. Musculoskeletal: No focal osseous lesions. CT ABDOMEN AND PELVIS FINDINGS Hepatobiliary: Numerous hepatic metastases throughout both lobes. Index lesions include: --2.2 x 2.8 cm lesion in  segment 8 (series 9/image 82) --1.6 x 2.0 cm lesion in segment 2 (series 9/image 88) --3.9 x 5.1 cm lesion in segment 7 (series 9/image 89) --1.8 x 2.1 cm lesion in segment 3 (series 9/image 111) Status post cholecystectomy. No intrahepatic or extrahepatic ductal dilatation. Pancreas: Within normal limits. Spleen: Within normal limits. Adrenals/Urinary Tract: 3.2 x 3.5 cm left adrenal mass containing macroscopic fat and coarse calcifications, previously 3.3 x 3.4 cm in 2018. 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 Regardless, given stability, this can be considered benign. Stable mild nodularity right adrenal gland measuring up to 9 mm (series 9/image 90), unchanged, benign. Kidneys are within normal limits.  No hydronephrosis. Bladder is within normal limits. Stomach/Bowel: Stomach is within normal limits. No evidence of bowel obstruction. Prior appendectomy. Sigmoid diverticulosis, without evidence of diverticulitis.  Vascular/Lymphatic: No evidence of abdominal aortic aneurysm. Atherosclerotic calcifications of the abdominal aorta and branch vessels. No suspicious abdominopelvic lymphadenopathy. Reproductive: Uterus is within normal limits. No adnexal masses. Other: No abdominopelvic ascites. Musculoskeletal: No focal osseous lesions. IMPRESSION: 3.8 cm spiculated right upper lobe mass, compatible with primary bronchogenic neoplasm. Associated thoracic nodal metastases, as above. Innumerable hepatic metastases in both lobes, with index lesions as above. Benign bilateral adrenal nodules/masses. Patient is scheduled for tissue sampling of the suspected hepatic metastases. PET-CT may also be beneficial for staging, as clinically warranted. Electronically Signed   By: Julian Hy M.D.   On: 08/19/2019 15:35   CT Chest W Contrast  Result Date: 08/19/2019 CLINICAL DATA:  Abnormal chest radiograph. Liver lesions on outside hospital CT abdomen/pelvis. EXAM: CT CHEST WITH CONTRAST CT ABDOMEN AND PELVIS WITH AND WITHOUT CONTRAST TECHNIQUE: Multidetector CT imaging of the chest was performed during intravenous contrast administration. Multidetector CT imaging of the abdomen and pelvis was performed following the standard protocol before and during bolus administration of intravenous contrast. CONTRAST:  147m OMNIPAQUE IOHEXOL 300 MG/ML  SOLN COMPARISON:  Chest radiographs dated 08/19/2019. CT abdomen/pelvis dated 09/21/2016. FINDINGS: CT CHEST FINDINGS Cardiovascular: Heart is normal in size.  No pericardial effusion. No evidence of thoracic aortic aneurysm. Atherosclerotic calcifications of the aortic arch. Mild coronary atherosclerosis the LAD and right coronary artery. Mediastinum/Nodes: 8 mm short axis low right paratracheal node (series 9/image 45). 16 mm short axis right hilar node (series 9/image 49). 10 mm short axis right perihilar node (series 9/image 42). Visualized thyroid is notable for a 5 mm right thyroid nodule  (series 9/image 15). No followup recommended (ref: J Am Coll Radiol. 2015 Feb;12(2): 143-50). Lungs/Pleura: 2.7 x 3.8 x 2.6 cm spiculated right upper lobe mass (series 11/image 63), compatible with primary bronchogenic neoplasm. Mild scarring/atelectasis in the medial right middle lobe and posterior lingula. No focal consolidation. Moderate centrilobular and paraseptal emphysematous changes, upper lung predominant. Mild biapical pleural-parenchymal scarring, right greater than left. No pleural effusion or pneumothorax. Musculoskeletal: No focal osseous lesions. CT ABDOMEN AND PELVIS FINDINGS Hepatobiliary: Numerous hepatic metastases throughout both lobes. Index lesions include: --2.2 x 2.8 cm lesion in segment 8 (series 9/image 82) --1.6 x 2.0 cm lesion in segment 2 (series 9/image 88) --3.9 x 5.1 cm lesion in segment 7 (series 9/image 89) --1.8 x 2.1 cm lesion in segment 3 (series 9/image 111) Status post cholecystectomy. No intrahepatic or extrahepatic ductal dilatation. Pancreas: Within normal limits. Spleen: Within normal limits. Adrenals/Urinary Tract: 3.2 x 3.5 cm left adrenal mass containing macroscopic fat and coarse calcifications, previously 3.3 x 3.4 cm in 2018. 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 Regardless, given stability,  this can be considered benign. Stable mild nodularity right adrenal gland measuring up to 9 mm (series 9/image 90), unchanged, benign. Kidneys are within normal limits.  No hydronephrosis. Bladder is within normal limits. Stomach/Bowel: Stomach is within normal limits. No evidence of bowel obstruction. Prior appendectomy. Sigmoid diverticulosis, without evidence of diverticulitis. Vascular/Lymphatic: No evidence of abdominal aortic aneurysm. Atherosclerotic calcifications of the abdominal aorta and branch vessels. No suspicious abdominopelvic lymphadenopathy. Reproductive: Uterus is within normal limits. No adnexal masses. Other: No abdominopelvic ascites.  Musculoskeletal: No focal osseous lesions. IMPRESSION: 3.8 cm spiculated right upper lobe mass, compatible with primary bronchogenic neoplasm. Associated thoracic nodal metastases, as above. Innumerable hepatic metastases in both lobes, with index lesions as above. Benign bilateral adrenal nodules/masses. Patient is scheduled for tissue sampling of the suspected hepatic metastases. PET-CT may also be beneficial for staging, as clinically warranted. Electronically Signed   By: Julian Hy M.D.   On: 08/19/2019 15:35   DG Chest Port 1 View  Result Date: 08/19/2019 CLINICAL DATA:  Fever, shortness of breath. EXAM: PORTABLE CHEST 1 VIEW COMPARISON:  September 21, 2016. FINDINGS: The heart size and mediastinal contours are within normal limits. No pneumothorax or pleural effusion is noted. Left lung is clear. Large spiculated mass is noted in right upper lobe concerning for malignancy; CT scan of the chest with intravenous contrast is recommended for further evaluation. The visualized skeletal structures are unremarkable. IMPRESSION: Large spiculated mass is noted in right upper lobe concerning for malignancy; CT scan of the chest with intravenous contrast is recommended for further evaluation. Electronically Signed   By: Marijo Conception M.D.   On: 08/19/2019 13:51        Scheduled Meds: . amLODipine  5 mg Oral QHS  . heparin  5,000 Units Subcutaneous Q8H  . oxyCODONE  5 mg Oral Q8H  . propranolol  5 mg Oral BID  . umeclidinium bromide  1 puff Inhalation QPM   Continuous Infusions: . cefTRIAXone (ROCEPHIN)  IV Stopped (08/19/19 1937)     LOS: 1 day    Time spent: 35 minutes    Briston Lax Darleen Crocker, DO [facility]  If 7PM-7AM, please contact night-coverage [email or web address] 08/20/2019, 10:59 AM

## 2019-08-20 NOTE — Plan of Care (Signed)

## 2019-08-20 NOTE — Progress Notes (Signed)
Initial Nutrition Assessment  DOCUMENTATION CODES:   Severe malnutrition in context of chronic illness  INTERVENTION:  Ensure Enlive po BID, each supplement provides 350 kcal and 20 grams of protein (prefers chocolate served cold)  Magic cup BID with meals, each supplement provides 290 kcal and 9 grams of protein (chocolate)  MVI with minerals daily  Education  Dietary preference updated in [employer]  NUTRITION DIAGNOSIS:   Severe Malnutrition related to chronic illness(COPD, 2L O2 dependent) as evidenced by moderate fat depletion, severe fat depletion, moderate muscle depletion, severe muscle depletion, percent weight loss, per patient/family report, energy intake < or equal to 75% for > or equal to 1 month.   GOAL:   Patient will meet greater than or equal to 90% of their needs   MONITOR:   PO intake, Supplement acceptance, Labs, Weight trends, I & O's  REASON FOR ASSESSMENT:   Malnutrition Screening Tool    ASSESSMENT:  69 year old female with past medical history significant of COPD, O2 dependent, Crohn's colitis, HTN, osteoporosis, h/o tobacco abuse, chronic neck pain presented at the request of PCP after several days of fevers, anorexia, general malaise, right side and chest wall pain, as well as 10 lb unintentional weight loss over the last few weeks. In ED, CT chest revealed new finding of spiculate mass, and recent outpatient CT scan of abdomen noted to have mets throughout the liver, biopsy was scheduled at [facility] for 3/25.  Patient admitted on 3/23 for further workup and management of new lung mass.  Patient sitting up in bed with family in room this afternoon, reports feeling a little bit better today. She reports that she had not eaten her lunch yet, ordered applesauce, dinner role, crackers, and gingerale. Patient endorses poor appetite over the past couple of weeks, daughter stated patient has been eating mostly applesauce lately. She recalls normally having a  good appetite, reports 1 meal/day and snacks (likes homemade cheese pizza, creamed potatoes, macaroni and cheese, tomatoes)  Patient reports that she is a vegetarian and endorses daily MVI and omega 3, although she has not taken over the past couple of weeks due to not feeling well. RD educated on good sources of protein (eggs, beans, cheese, cottage cheese, yogurt, milk, nut and nut butters) Patient was encouraged by PCP during last visit to began drinking protein supplement. Patient brought 6 Ensure High Protein (160 kcal and 16 grams protein) to hospital, requested to keep them cold. Provided education on types of ONS and recommended looking for supplements that provide additional calories as well as protein. RD educated on Ensure Enlive supplement available during admission that offered increased calories/protein. Patient amenable to cold chocolate Ensure Enlive during admission.   Current wt 113.3 lbs Patient recalls usually weighing around 123 lb and endorses ~10 lb recent weight loss secondary to having diverticulitis in November and again in February. Limited recent weight history for review, on 04/29/19 pt wt 56.7 kg (124.74 lbs) indicating an 11.44 lb (9.2%) wt loss in 3.5 months which is significant.  Per notes: -fevers improving -blood and urine cultures pending -PCT for goals of care -oncology evaluation pending -outpatient liver biopsy vs inpatient by 3/26 depending on clinical course Medications reviewed and include: oxycodone IVPB: Rocephin  Labs: BG 101-136, BUN 7 (L), WBC 16.5 (H)   NUTRITION - FOCUSED PHYSICAL EXAM:    Most Recent Value  Orbital Region  Moderate depletion  Upper Arm Region  Severe depletion  Thoracic and Lumbar Region  Unable to assess [  R sided pain, previous rib fx]  Buccal Region  Severe depletion  Temple Region  Moderate depletion  Clavicle Bone Region  Severe depletion  Clavicle and Acromion Bone Region  Severe depletion  Scapular Bone Region  Unable  to assess  Dorsal Hand  Moderate depletion  Patellar Region  Severe depletion  Anterior Thigh Region  Moderate depletion  Posterior Calf Region  Moderate depletion  Edema (RD Assessment)  None  Hair  Reviewed  Eyes  Reviewed  Mouth  Reviewed  Skin  Reviewed  Nails  Reviewed       Diet Order:   Diet Order            Diet regular Room service appropriate? Yes; Fluid consistency: Thin  Diet effective now              EDUCATION NEEDS:   Education needs have been addressed  Skin:  Skin Assessment: Reviewed RN Assessment  Last BM:  PTA  Height:   Ht Readings from Last 1 Encounters:  08/19/19 5' 2"  (1.575 m)    Weight:   Wt Readings from Last 1 Encounters:  08/19/19 51.5 kg    Ideal Body Weight:  50 kg  BMI:  Body mass index is 20.77 kg/m.  Estimated Nutritional Needs:   Kcal:  1700-1900  Protein:  90-105  Fluid:  >/= 1.7 L/day   Lajuan Lines, RD, LDN Clinical Nutrition After Hours/Weekend Pager # in [place] [facility] [place]

## 2019-08-21 ENCOUNTER — Other Ambulatory Visit: Payer: Self-pay | Admitting: Student

## 2019-08-21 ENCOUNTER — Inpatient Hospital Stay (HOSPITAL_COMMUNITY): Payer: Medicare Other

## 2019-08-21 DIAGNOSIS — Z7189 Other specified counseling: Secondary | ICD-10-CM

## 2019-08-21 DIAGNOSIS — Z515 Encounter for palliative care: Secondary | ICD-10-CM

## 2019-08-21 LAB — COMPREHENSIVE METABOLIC PANEL
ALT: 21 U/L (ref 0–44)
AST: 34 U/L (ref 15–41)
Albumin: 2.4 g/dL — ABNORMAL LOW (ref 3.5–5.0)
Alkaline Phosphatase: 270 U/L — ABNORMAL HIGH (ref 38–126)
Anion gap: 11 (ref 5–15)
BUN: 11 mg/dL (ref 8–23)
CO2: 24 mmol/L (ref 22–32)
Calcium: 8.2 mg/dL — ABNORMAL LOW (ref 8.9–10.3)
Chloride: 102 mmol/L (ref 98–111)
Creatinine, Ser: 0.49 mg/dL (ref 0.44–1.00)
GFR calc Af Amer: >60 mL/min (ref >60)
GFR calc non Af Amer: >60 mL/min (ref >60)
Glucose, Bld: 112 mg/dL — ABNORMAL HIGH (ref 70–99)
Potassium: 3.8 mmol/L (ref 3.5–5.1)
Sodium: 137 mmol/L (ref 135–145)
Total Bilirubin: 0.7 mg/dL (ref 0.3–1.2)
Total Protein: 5.7 g/dL — ABNORMAL LOW (ref 6.5–8.1)

## 2019-08-21 LAB — CBC
HCT: 33.3 % — ABNORMAL LOW (ref 36.0–46.0)
Hemoglobin: 10.2 g/dL — ABNORMAL LOW (ref 12.0–15.0)
MCH: 27.1 pg (ref 26.0–34.0)
MCHC: 30.6 g/dL (ref 30.0–36.0)
MCV: 88.3 fL (ref 80.0–100.0)
Platelets: 377 10*3/uL (ref 150–400)
RBC: 3.77 MIL/uL — ABNORMAL LOW (ref 3.87–5.11)
RDW: 14.2 % (ref 11.5–15.5)
WBC: 13.9 10*3/uL — ABNORMAL HIGH (ref 4.0–10.5)
nRBC: 0 % (ref 0.0–0.2)

## 2019-08-21 LAB — URINE CULTURE: Culture: 10000 — AB

## 2019-08-21 LAB — PROCALCITONIN: Procalcitonin: 11.2 ng/mL

## 2019-08-21 LAB — C-REACTIVE PROTEIN: CRP: 24.1 mg/dL — ABNORMAL HIGH (ref <1.0)

## 2019-08-21 LAB — SEDIMENTATION RATE: Sed Rate: 80 mm/hr — ABNORMAL HIGH (ref 0–22)

## 2019-08-21 LAB — LACTIC ACID, PLASMA: Lactic Acid, Venous: 0.9 mmol/L (ref 0.5–1.9)

## 2019-08-21 MED ORDER — POLYETHYLENE GLYCOL 3350 17 G PO PACK: 17.0000 g | PACK | Freq: Every day | ORAL | Status: DC

## 2019-08-21 MED ORDER — SENNA 8.6 MG PO TABS: 1.0000 | ORAL_TABLET | Freq: Every evening | ORAL | Status: DC | PRN

## 2019-08-21 MED ORDER — OXYCODONE HCL 5 MG PO TABS: 5.0000 mg | ORAL_TABLET | Freq: Four times a day (QID) | ORAL | 0 refills | Status: DC | PRN

## 2019-08-21 MED ORDER — ALPRAZOLAM 0.25 MG PO TABS
0.2500 mg | ORAL_TABLET | Freq: Once | ORAL | Status: AC
  Administered 2019-08-21: 0.25 mg via ORAL
  Filled 2019-08-21: qty 1

## 2019-08-21 MED ORDER — POLYETHYLENE GLYCOL 3350 17 G PO PACK: 17.0000 g | PACK | Freq: Every day | ORAL | 0 refills | Status: AC

## 2019-08-21 MED ORDER — OXYCODONE HCL 5 MG PO TABS
5.0000 mg | ORAL_TABLET | ORAL | Status: DC | PRN
  Administered 2019-08-21: 5 mg via ORAL
  Filled 2019-08-21: qty 1

## 2019-08-21 MED ORDER — SENNA 8.6 MG PO TABS: 1.0000 | ORAL_TABLET | Freq: Every evening | ORAL | 0 refills | Status: AC | PRN

## 2019-08-21 MED ORDER — ENSURE ENLIVE PO LIQD: 237.0000 mL | Freq: Two times a day (BID) | ORAL | 12 refills | Status: AC

## 2019-08-21 NOTE — Discharge Summary (Signed)
Physician Discharge Summary  Stacey Nash LPF:[id number] DOB: [birth year] DOA: 08/19/2019  PCP: Celene Squibb, MD  Admit date: 08/19/2019  Discharge date: 08/21/2019  Admitted From:Home  Disposition:  Home  Recommendations for Outpatient Follow-up:  1. Follow up with PCP in 1-2 weeks 2. Follow up with Oncology Dr. Delton Coombes in 1-2 weeks and have follow up MRI Brain in outpatient setting 3. Continue on pain medications as prescribed along with laxation 4. Follow up at [facility] for liver biopsy as scheduled on 3/26  Home Health:None  Equipment/Devices: Chronic 2 L nasal cannula oxygen  Discharge Condition:Stable  CODE STATUS: Full  Diet recommendation: Heart Healthy  Brief/Interim Summary: Per HPI: Stacey Underhill Pruittis a 69 y.o.femalewith medical history significant ofO2 dependent COPD, 50pack year smoker, Crohn's, HTN, SVT.   Pt presents at the request of PCP after several days of fevers, anorexia, general malaise, and R side and chest wall pain. Pt also w/ 10lb unintentional wt loss over the last few weeks. Has had broken ribs before and feels the pain is the same although no increased pain w/ inspiration. Pain is worse w/ certain movement. No change in BM habits. No COVID exposure. No cough. Pt states she quit smoking 6 years ago but has a 50pack year smoking history.   3/24: Patient has been admitted with complaints of fever as well as leukocytosis in the setting of right upper quadrant abdominal pain that appears to be related to hepatic congestion from metastatic disease.  Blood and urine cultures pending and patient started on Rocephin as well as some IV fluid.  Discussed case with oncology and appreciate evaluation.  Also appreciate palliative care evaluation to determine goals of care.  Discussed case with radiology coordinator with plans for either outpatient liver biopsy versus inpatient by 3/26 depending on clinical course.  3/25: Patient is stable for discharge today  with no fevers noted overnight or growth on cultures.  It appears that much of her fever is likely secondary to her metastatic disease as evaluated by oncology on 3/24.  Plans were for brain MRI today with and without contrast to evaluate for metastatic disease, but unfortunately MRI machine is nonfunctional.  This can be pursued in the outpatient setting with oncology follow-up.  She may proceed with her liver biopsy as scheduled on 3/26 at [facility] in the meantime.  She has been prescribed additional pain medications per palliative recommendations as well as laxatives to assist with bowel movements.  No other significant events during this hospitalization.  No further need for antibiotics noted.  She is stable for discharge.  Discharge Diagnoses:  Active Problems:   Tobacco abuse   Essential hypertension, benign   Crohn's colitis ([place])   Lung cancer ([place])   Chronic respiratory failure with hypoxia (HCC)   COPD with asthma (HCC)   Tachycardia   Fever   Lung mass   Liver mass   Goals of care, counseling/discussion   Palliative care by specialist  Principal discharge diagnosis: Fever in the setting of extensive tumor burden presumed to be metastatic lung cancer.  Discharge Instructions  Discharge Instructions    Diet - low sodium heart healthy   Complete by: As directed    Increase activity slowly   Complete by: As directed      Allergies as of 08/21/2019      Reactions   Sulfa Antibiotics Shortness Of Breath   Tetanus Toxoids Rash   Doxycycline Diarrhea   Statins    Leg pain and weakness  Zofran [ondansetron Hcl] Hives, Rash   Rash at injection site that spread immediately following administration        Medication List    TAKE these medications   acetaminophen 500 MG tablet Commonly known as: TYLENOL Take 1,000 mg by mouth daily as needed for mild pain, moderate pain or headache.   albuterol (2.5 MG/3ML) 0.083% nebulizer solution Commonly known as: PROVENTIL Take  2.5 mg by nebulization every 6 (six) hours as needed for wheezing or shortness of breath.   albuterol 108 (90 Base) MCG/ACT inhaler Commonly known as: VENTOLIN HFA Inhale 2 puffs into the lungs every 6 (six) hours as needed for wheezing or shortness of breath.   amLODipine 5 MG tablet Commonly known as: NORVASC Take 1 tablet (5 mg total) by mouth daily.   CALCIUM 600 + D PO Take 1 tablet by mouth daily.   CENTRUM SILVER ULTRA WOMENS PO Take by mouth daily.   feeding supplement (ENSURE ENLIVE) Liqd Take 237 mLs by mouth 2 (two) times daily between meals.   Fish Oil 1000 MG Caps Take 1,000 mg by mouth 2 (two) times daily.   fluticasone 50 MCG/ACT nasal spray Commonly known as: FLONASE Place 1-2 sprays into both nostrils daily as needed.   Gaviscon 95-358 MG/15ML Susp Generic drug: aluminum hydroxide-magnesium carbonate Take 15 mLs by mouth 3 (three) times daily as needed for indigestion or heartburn.   ICY HOT EX Apply 1 application topically daily as needed (pain).   loperamide 2 MG capsule Commonly known as: IMODIUM Take 1 capsule (2 mg total) by mouth 4 (four) times daily as needed for diarrhea or loose stools. What changed: when to take this   oxyCODONE 5 MG immediate release tablet Commonly known as: Oxy IR/ROXICODONE Take 1 tablet (5 mg total) by mouth every 6 (six) hours as needed.   OXYGEN Inhale 2 L into the lungs daily.   polyethylene glycol 17 g packet Commonly known as: MIRALAX / GLYCOLAX Take 17 g by mouth daily. Start taking on: August 22, 2019   promethazine 25 MG tablet Commonly known as: PHENERGAN Take 12.5-25 mg by mouth every 6 (six) hours as needed.   propranolol 10 MG tablet Commonly known as: INDERAL TAKE 1 TABLET BY MOUTH TWICE A DAY What changed:   how much to take  when to take this  reasons to take this  additional instructions   senna 8.6 MG Tabs tablet Commonly known as: SENOKOT Take 1 tablet (8.6 mg total) by mouth at  bedtime as needed for mild constipation.   Spiriva HandiHaler 18 MCG inhalation capsule Generic drug: tiotropium Place 18 mcg into inhaler and inhale every evening.   Vitamin D3 25 MCG (1000 UT) Caps Take 2,000 Units by mouth daily.      Follow-up Information    Celene Squibb, MD Follow up in 1 week(s).   Specialty: Internal Medicine Contact information: [place] [facility] [address] [phone]        [name], MD Follow up in 1 week(s).   Specialty: Hematology Contact information: [place] [address] [phone]          Allergies  Allergen Reactions  . Sulfa Antibiotics Shortness Of Breath  . Tetanus Toxoids Rash  . Doxycycline Diarrhea  . Statins     Leg pain and weakness  . Zofran [Ondansetron Hcl] Hives and Rash    Rash at injection site that spread immediately following administration  Consultations:  Oncology  Palliative care   Procedures/Studies: CT ABDOMEN PELVIS W WO CONTRAST  Result Date: 08/19/2019 CLINICAL DATA:  Abnormal chest radiograph. Liver lesions on outside hospital CT abdomen/pelvis. EXAM: CT CHEST WITH CONTRAST CT ABDOMEN AND PELVIS WITH AND WITHOUT CONTRAST TECHNIQUE: Multidetector CT imaging of the chest was performed during intravenous contrast administration. Multidetector CT imaging of the abdomen and pelvis was performed following the standard protocol before and during bolus administration of intravenous contrast. CONTRAST:  19m OMNIPAQUE IOHEXOL 300 MG/ML  SOLN COMPARISON:  Chest radiographs dated 08/19/2019. CT abdomen/pelvis dated 09/21/2016. FINDINGS: CT CHEST FINDINGS Cardiovascular: Heart is normal in size.  No pericardial effusion. No evidence of thoracic aortic aneurysm. Atherosclerotic calcifications of the aortic arch. Mild coronary atherosclerosis the LAD and right coronary artery. Mediastinum/Nodes: 8 mm short axis low right paratracheal node (series 9/image 45). 16 mm short  axis right hilar node (series 9/image 49). 10 mm short axis right perihilar node (series 9/image 42). Visualized thyroid is notable for a 5 mm right thyroid nodule (series 9/image 15). No followup recommended (ref: J Am Coll Radiol. 2015 Feb;12(2): 143-50). Lungs/Pleura: 2.7 x 3.8 x 2.6 cm spiculated right upper lobe mass (series 11/image 63), compatible with primary bronchogenic neoplasm. Mild scarring/atelectasis in the medial right middle lobe and posterior lingula. No focal consolidation. Moderate centrilobular and paraseptal emphysematous changes, upper lung predominant. Mild biapical pleural-parenchymal scarring, right greater than left. No pleural effusion or pneumothorax. Musculoskeletal: No focal osseous lesions. CT ABDOMEN AND PELVIS FINDINGS Hepatobiliary: Numerous hepatic metastases throughout both lobes. Index lesions include: --2.2 x 2.8 cm lesion in segment 8 (series 9/image 82) --1.6 x 2.0 cm lesion in segment 2 (series 9/image 88) --3.9 x 5.1 cm lesion in segment 7 (series 9/image 89) --1.8 x 2.1 cm lesion in segment 3 (series 9/image 111) Status post cholecystectomy. No intrahepatic or extrahepatic ductal dilatation. Pancreas: Within normal limits. Spleen: Within normal limits. Adrenals/Urinary Tract: 3.2 x 3.5 cm left adrenal mass containing macroscopic fat and coarse calcifications, previously 3.3 x 3.4 cm in 2018. 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 Regardless, given stability, this can be considered benign. Stable mild nodularity right adrenal gland measuring up to 9 mm (series 9/image 90), unchanged, benign. Kidneys are within normal limits.  No hydronephrosis. Bladder is within normal limits. Stomach/Bowel: Stomach is within normal limits. No evidence of bowel obstruction. Prior appendectomy. Sigmoid diverticulosis, without evidence of diverticulitis. Vascular/Lymphatic: No evidence of abdominal aortic aneurysm. Atherosclerotic calcifications of the abdominal aorta and branch  vessels. No suspicious abdominopelvic lymphadenopathy. Reproductive: Uterus is within normal limits. No adnexal masses. Other: No abdominopelvic ascites. Musculoskeletal: No focal osseous lesions. IMPRESSION: 3.8 cm spiculated right upper lobe mass, compatible with primary bronchogenic neoplasm. Associated thoracic nodal metastases, as above. Innumerable hepatic metastases in both lobes, with index lesions as above. Benign bilateral adrenal nodules/masses. Patient is scheduled for tissue sampling of the suspected hepatic metastases. PET-CT may also be beneficial for staging, as clinically warranted. Electronically Signed   By: SJulian HyM.D.   On: 08/19/2019 15:35   CT Chest W Contrast  Result Date: 08/19/2019 CLINICAL DATA:  Abnormal chest radiograph. Liver lesions on outside hospital CT abdomen/pelvis. EXAM: CT CHEST WITH CONTRAST CT ABDOMEN AND PELVIS WITH AND WITHOUT CONTRAST TECHNIQUE: Multidetector CT imaging of the chest was performed during intravenous contrast administration. Multidetector CT imaging of the abdomen and pelvis was performed following the standard protocol before and during bolus administration of intravenous contrast. CONTRAST:  1025mOMNIPAQUE IOHEXOL 300 MG/ML  SOLN COMPARISON:  Chest radiographs dated 08/19/2019. CT abdomen/pelvis dated 09/21/2016. FINDINGS: CT CHEST FINDINGS Cardiovascular: Heart is normal in size.  No pericardial effusion. No evidence of thoracic aortic aneurysm. Atherosclerotic calcifications of the aortic arch. Mild coronary atherosclerosis the LAD and right coronary artery. Mediastinum/Nodes: 8 mm short axis low right paratracheal node (series 9/image 45). 16 mm short axis right hilar node (series 9/image 49). 10 mm short axis right perihilar node (series 9/image 42). Visualized thyroid is notable for a 5 mm right thyroid nodule (series 9/image 15). No followup recommended (ref: J Am Coll Radiol. 2015 Feb;12(2): 143-50). Lungs/Pleura: 2.7 x 3.8 x 2.6 cm  spiculated right upper lobe mass (series 11/image 63), compatible with primary bronchogenic neoplasm. Mild scarring/atelectasis in the medial right middle lobe and posterior lingula. No focal consolidation. Moderate centrilobular and paraseptal emphysematous changes, upper lung predominant. Mild biapical pleural-parenchymal scarring, right greater than left. No pleural effusion or pneumothorax. Musculoskeletal: No focal osseous lesions. CT ABDOMEN AND PELVIS FINDINGS Hepatobiliary: Numerous hepatic metastases throughout both lobes. Index lesions include: --2.2 x 2.8 cm lesion in segment 8 (series 9/image 82) --1.6 x 2.0 cm lesion in segment 2 (series 9/image 88) --3.9 x 5.1 cm lesion in segment 7 (series 9/image 89) --1.8 x 2.1 cm lesion in segment 3 (series 9/image 111) Status post cholecystectomy. No intrahepatic or extrahepatic ductal dilatation. Pancreas: Within normal limits. Spleen: Within normal limits. Adrenals/Urinary Tract: 3.2 x 3.5 cm left adrenal mass containing macroscopic fat and coarse calcifications, previously 3.3 x 3.4 cm in 2018. 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 Regardless, given stability, this can be considered benign. Stable mild nodularity right adrenal gland measuring up to 9 mm (series 9/image 90), unchanged, benign. Kidneys are within normal limits.  No hydronephrosis. Bladder is within normal limits. Stomach/Bowel: Stomach is within normal limits. No evidence of bowel obstruction. Prior appendectomy. Sigmoid diverticulosis, without evidence of diverticulitis. Vascular/Lymphatic: No evidence of abdominal aortic aneurysm. Atherosclerotic calcifications of the abdominal aorta and branch vessels. No suspicious abdominopelvic lymphadenopathy. Reproductive: Uterus is within normal limits. No adnexal masses. Other: No abdominopelvic ascites. Musculoskeletal: No focal osseous lesions. IMPRESSION: 3.8 cm spiculated right upper lobe mass, compatible with primary bronchogenic  neoplasm. Associated thoracic nodal metastases, as above. Innumerable hepatic metastases in both lobes, with index lesions as above. Benign bilateral adrenal nodules/masses. Patient is scheduled for tissue sampling of the suspected hepatic metastases. PET-CT may also be beneficial for staging, as clinically warranted. Electronically Signed   By: Julian Hy M.D.   On: 08/19/2019 15:35   DG Chest Port 1 View  Result Date: 08/19/2019 CLINICAL DATA:  Fever, shortness of breath. EXAM: PORTABLE CHEST 1 VIEW COMPARISON:  September 21, 2016. FINDINGS: The heart size and mediastinal contours are within normal limits. No pneumothorax or pleural effusion is noted. Left lung is clear. Large spiculated mass is noted in right upper lobe concerning for malignancy; CT scan of the chest with intravenous contrast is recommended for further evaluation. The visualized skeletal structures are unremarkable. IMPRESSION: Large spiculated mass is noted in right upper lobe concerning for malignancy; CT scan of the chest with intravenous contrast is recommended for further evaluation. Electronically Signed   By: Marijo Conception M.D.   On: 08/19/2019 13:51     Discharge Exam: Vitals:   08/21/19 0548 08/21/19 0841  BP: 130/72   Pulse: 95   Resp: 16   Temp: 98.3 F (36.8 C)   SpO2: 98% 96%   Vitals:   08/20/19 2141 08/20/19  2144 08/21/19 0548 08/21/19 0841  BP: (!) 161/90 (!) 155/91 130/72   Pulse: (!) 108 (!) 107 95   Resp:   16   Temp:   98.3 F (36.8 C)   TempSrc:   Oral   SpO2: 97% 97% 98% 96%  Weight:      Height:        General: Pt is alert, awake, not in acute distress Cardiovascular: RRR, S1/S2 +, no rubs, no gallops Respiratory: CTA bilaterally, no wheezing, no rhonchi, on chronic 2 L nasal cannula oxygen Abdominal: Soft, NT, ND, bowel sounds + Extremities: no edema, no cyanosis    The results of significant diagnostics from this hospitalization (including imaging, microbiology, ancillary and  laboratory) are listed below for reference.     Microbiology: Recent Results (from the past 240 hour(s))  Culture, blood (routine x 2)     Status: None (Preliminary result)   Collection Time: 08/19/19  1:39 PM   Specimen: Right Antecubital; Blood  Result Value Ref Range Status   Specimen Description RIGHT ANTECUBITAL  Final   Special Requests   Final    BOTTLES DRAWN AEROBIC AND ANAEROBIC Blood Culture adequate volume   Culture   Final    NO GROWTH 2 DAYS Performed at [facility], [address], [place], [place] [address]    Report Status PENDING  Incomplete  SARS CORONAVIRUS 2 (TAT 6-24 HRS) Nasopharyngeal Nasopharyngeal Swab     Status: None   Collection Time: 08/19/19  1:39 PM   Specimen: Nasopharyngeal Swab  Result Value Ref Range Status   SARS Coronavirus 2 NEGATIVE NEGATIVE Final    Comment: (NOTE) SARS-CoV-2 target nucleic acids are NOT DETECTED. The SARS-CoV-2 RNA is generally detectable in upper and lower respiratory specimens during the acute phase of infection. Negative results do not preclude SARS-CoV-2 infection, do not rule out co-infections with other pathogens, and should not be used as the sole basis for treatment or other patient management decisions. Negative results must be combined with clinical observations, patient history, and epidemiological information. The expected result is Negative. Fact Sheet for Patients: SugarRoll.be Fact Sheet for Healthcare Providers: https://www.woods-mathews.com/ This test is not yet approved or cleared by the [place] FDA and  has been authorized for detection and/or diagnosis of SARS-CoV-2 by FDA under an Emergency Use Authorization (EUA). This EUA will remain  in effect (meaning this test can be used) for the duration of the COVID-19 declaration under Section 56 4(b)(1) of the Act, 21 U.S.C. section 360bbb-3(b)(1), unless the authorization is terminated or revoked  sooner. Performed at [facility], [place] [address], [place], [place] [address]   Culture, blood (routine x 2)     Status: None (Preliminary result)   Collection Time: 08/19/19  1:53 PM   Specimen: Left Antecubital; Blood  Result Value Ref Range Status   Specimen Description LEFT ANTECUBITAL  Final   Special Requests   Final    BOTTLES DRAWN AEROBIC AND ANAEROBIC Blood Culture adequate volume   Culture   Final    NO GROWTH 2 DAYS Performed at [facility], [address] [place], [place] [address]    Report Status PENDING  Incomplete  Urine culture     Status: Abnormal   Collection Time: 08/19/19  3:29 PM   Specimen: Urine, Clean Catch  Result Value Ref Range Status   Specimen Description   Final    URINE, CLEAN CATCH Performed at [facility], [address] [place], [place] [address]  Special Requests   Final    NONE Performed at [facility], [address], [place], [place] [address]    Culture 10,000 COLONIES/mL KLEBSIELLA OXYTOCA (A)  Final   Report Status 08/21/2019 FINAL  Final   Organism ID, Bacteria KLEBSIELLA OXYTOCA (A)  Final      Susceptibility   Klebsiella oxytoca - MIC*    AMPICILLIN >=32 RESISTANT Resistant     CEFAZOLIN <=4 SENSITIVE Sensitive     CEFTRIAXONE <=0.25 SENSITIVE Sensitive     CIPROFLOXACIN <=0.25 SENSITIVE Sensitive     GENTAMICIN <=1 SENSITIVE Sensitive     IMIPENEM <=0.25 SENSITIVE Sensitive     NITROFURANTOIN 64 INTERMEDIATE Intermediate     TRIMETH/SULFA <=20 SENSITIVE Sensitive     AMPICILLIN/SULBACTAM 8 SENSITIVE Sensitive     PIP/TAZO <=4 SENSITIVE Sensitive     * 10,000 COLONIES/mL KLEBSIELLA OXYTOCA     Labs: BNP (last 3 results) No results for input(s): BNP in the last 8760 hours. Basic Metabolic Panel: Recent Labs  Lab 08/19/19 1340 08/20/19 0455 08/21/19 0437  NA 134* 137 137  K 3.7 3.7 3.8  CL 101 103 102  CO2 22 22 24   GLUCOSE 136* 101* 112*  BUN 9 7* 11  CREATININE 0.55 0.48 0.49  CALCIUM 8.3* 8.0* 8.2*    Liver Function Tests: Recent Labs  Lab 08/19/19 1340 08/20/19 0455 08/21/19 0437  AST 60* 44* 34  ALT 30 24 21   ALKPHOS 361* 288* 270*  BILITOT 1.2 0.9 0.7  PROT 6.6 5.7* 5.7*  ALBUMIN 2.8* 2.4* 2.4*   No results for input(s): LIPASE, AMYLASE in the last 168 hours. No results for input(s): AMMONIA in the last 168 hours. CBC: Recent Labs  Lab 08/19/19 1340 08/20/19 0455 08/21/19 0437  WBC 16.9* 16.5* 13.9*  NEUTROABS 14.2*  --   --   HGB 12.1 10.1* 10.2*  HCT 38.2 33.0* 33.3*  MCV 87.2 87.5 88.3  PLT 475* 455* 377   Cardiac Enzymes: No results for input(s): CKTOTAL, CKMB, CKMBINDEX, TROPONINI in the last 168 hours. BNP: Invalid input(s): POCBNP CBG: No results for input(s): GLUCAP in the last 168 hours. D-Dimer No results for input(s): DDIMER in the last 72 hours. Hgb A1c No results for input(s): HGBA1C in the last 72 hours. Lipid Profile No results for input(s): CHOL, HDL, LDLCALC, TRIG, CHOLHDL, LDLDIRECT in the last 72 hours. Thyroid function studies No results for input(s): TSH, T4TOTAL, T3FREE, THYROIDAB in the last 72 hours.  Invalid input(s): FREET3 Anemia work up No results for input(s): VITAMINB12, FOLATE, FERRITIN, TIBC, IRON, RETICCTPCT in the last 72 hours. Urinalysis    Component Value Date/Time   COLORURINE STRAW (A) 08/19/2019 1529   APPEARANCEUR CLEAR 08/19/2019 1529   LABSPEC 1.018 08/19/2019 1529   PHURINE 7.0 08/19/2019 1529   GLUCOSEU NEGATIVE 08/19/2019 1529   HGBUR NEGATIVE 08/19/2019 1529   BILIRUBINUR NEGATIVE 08/19/2019 1529   KETONESUR NEGATIVE 08/19/2019 1529   PROTEINUR NEGATIVE 08/19/2019 1529   UROBILINOGEN 0.2 02/15/2015 1926   NITRITE NEGATIVE 08/19/2019 1529   LEUKOCYTESUR NEGATIVE 08/19/2019 1529   Sepsis Labs Invalid input(s): PROCALCITONIN,  WBC,  LACTICIDVEN Microbiology Recent Results (from the past 240 hour(s))  Culture, blood (routine x 2)     Status: None (Preliminary result)   Collection Time: 08/19/19   1:39 PM   Specimen: Right Antecubital; Blood  Result Value Ref Range Status   Specimen Description RIGHT ANTECUBITAL  Final   Special Requests   Final    BOTTLES DRAWN AEROBIC  AND ANAEROBIC Blood Culture adequate volume   Culture   Final    NO GROWTH 2 DAYS Performed at [facility], [address], [place], [place] [address]    Report Status PENDING  Incomplete  SARS CORONAVIRUS 2 (TAT 6-24 HRS) Nasopharyngeal Nasopharyngeal Swab     Status: None   Collection Time: 08/19/19  1:39 PM   Specimen: Nasopharyngeal Swab  Result Value Ref Range Status   SARS Coronavirus 2 NEGATIVE NEGATIVE Final    Comment: (NOTE) SARS-CoV-2 target nucleic acids are NOT DETECTED. The SARS-CoV-2 RNA is generally detectable in upper and lower respiratory specimens during the acute phase of infection. Negative results do not preclude SARS-CoV-2 infection, do not rule out co-infections with other pathogens, and should not be used as the sole basis for treatment or other patient management decisions. Negative results must be combined with clinical observations, patient history, and epidemiological information. The expected result is Negative. Fact Sheet for Patients: SugarRoll.be Fact Sheet for Healthcare Providers: https://www.woods-mathews.com/ This test is not yet approved or cleared by the [place] FDA and  has been authorized for detection and/or diagnosis of SARS-CoV-2 by FDA under an Emergency Use Authorization (EUA). This EUA will remain  in effect (meaning this test can be used) for the duration of the COVID-19 declaration under Section 56 4(b)(1) of the Act, 21 U.S.C. section 360bbb-3(b)(1), unless the authorization is terminated or revoked sooner. Performed at [facility], [place] [address] [place], [place] [address]   Culture, blood (routine x 2)     Status: None (Preliminary result)   Collection Time: 08/19/19  1:53 PM   Specimen: Left  Antecubital; Blood  Result Value Ref Range Status   Specimen Description LEFT ANTECUBITAL  Final   Special Requests   Final    BOTTLES DRAWN AEROBIC AND ANAEROBIC Blood Culture adequate volume   Culture   Final    NO GROWTH 2 DAYS Performed at [facility], [address] [place], [place] [address]    Report Status PENDING  Incomplete  Urine culture     Status: Abnormal   Collection Time: 08/19/19  3:29 PM   Specimen: Urine, Clean Catch  Result Value Ref Range Status   Specimen Description   Final    URINE, CLEAN CATCH Performed at [facility], [address] [place], [place] [address]    Special Requests   Final    NONE Performed at [facility], [address] [place], [place] [address]    Culture 10,000 COLONIES/mL KLEBSIELLA OXYTOCA (A)  Final   Report Status 08/21/2019 FINAL  Final   Organism ID, Bacteria KLEBSIELLA OXYTOCA (A)  Final      Susceptibility   Klebsiella oxytoca - MIC*    AMPICILLIN >=32 RESISTANT Resistant     CEFAZOLIN <=4 SENSITIVE Sensitive     CEFTRIAXONE <=0.25 SENSITIVE Sensitive     CIPROFLOXACIN <=0.25 SENSITIVE Sensitive     GENTAMICIN <=1 SENSITIVE Sensitive     IMIPENEM <=0.25 SENSITIVE Sensitive     NITROFURANTOIN 64 INTERMEDIATE Intermediate     TRIMETH/SULFA <=20 SENSITIVE Sensitive     AMPICILLIN/SULBACTAM 8 SENSITIVE Sensitive     PIP/TAZO <=4 SENSITIVE Sensitive     * 10,000 COLONIES/mL KLEBSIELLA OXYTOCA     Time coordinating discharge: 68mnutes  SIGNED:   PRodena Goldmann DO [facility] 08/21/2019, 12:20 PM  If 7PM-7AM, please contact night-coverage [email or web address]

## 2019-08-21 NOTE — Progress Notes (Signed)
Nsg Discharge Note  Admit Date:  08/19/2019 Discharge date: 08/21/2019   LUCIENNE SAWYERS to be D/C'd home per MD order.  AVS completed.  Copy for chart, and copy for patient signed, and dated. Patient/caregiver able to verbalize understanding.  Discharge Medication: Allergies as of 08/21/2019      Reactions   Sulfa Antibiotics Shortness Of Breath   Tetanus Toxoids Rash   Doxycycline Diarrhea   Statins    Leg pain and weakness   Zofran [ondansetron Hcl] Hives, Rash   Rash at injection site that spread immediately following administration        Medication List    TAKE these medications   acetaminophen 500 MG tablet Commonly known as: TYLENOL Take 1,000 mg by mouth daily as needed for mild pain, moderate pain or headache.   albuterol (2.5 MG/3ML) 0.083% nebulizer solution Commonly known as: PROVENTIL Take 2.5 mg by nebulization every 6 (six) hours as needed for wheezing or shortness of breath.   albuterol 108 (90 Base) MCG/ACT inhaler Commonly known as: VENTOLIN HFA Inhale 2 puffs into the lungs every 6 (six) hours as needed for wheezing or shortness of breath.   amLODipine 5 MG tablet Commonly known as: NORVASC Take 1 tablet (5 mg total) by mouth daily.   CALCIUM 600 + D PO Take 1 tablet by mouth daily.   CENTRUM SILVER ULTRA WOMENS PO Take by mouth daily.   feeding supplement (ENSURE ENLIVE) Liqd Take 237 mLs by mouth 2 (two) times daily between meals.   Fish Oil 1000 MG Caps Take 1,000 mg by mouth 2 (two) times daily.   fluticasone 50 MCG/ACT nasal spray Commonly known as: FLONASE Place 1-2 sprays into both nostrils daily as needed.   Gaviscon 95-358 MG/15ML Susp Generic drug: aluminum hydroxide-magnesium carbonate Take 15 mLs by mouth 3 (three) times daily as needed for indigestion or heartburn.   ICY HOT EX Apply 1 application topically daily as needed (pain).   loperamide 2 MG capsule Commonly known as: IMODIUM Take 1 capsule (2 mg total) by mouth 4  (four) times daily as needed for diarrhea or loose stools. What changed: when to take this   oxyCODONE 5 MG immediate release tablet Commonly known as: Oxy IR/ROXICODONE Take 1 tablet (5 mg total) by mouth every 6 (six) hours as needed.   OXYGEN Inhale 2 L into the lungs daily.   polyethylene glycol 17 g packet Commonly known as: MIRALAX / GLYCOLAX Take 17 g by mouth daily. Start taking on: August 22, 2019   promethazine 25 MG tablet Commonly known as: PHENERGAN Take 12.5-25 mg by mouth every 6 (six) hours as needed.   propranolol 10 MG tablet Commonly known as: INDERAL TAKE 1 TABLET BY MOUTH TWICE A DAY What changed:   how much to take  when to take this  reasons to take this  additional instructions   senna 8.6 MG Tabs tablet Commonly known as: SENOKOT Take 1 tablet (8.6 mg total) by mouth at bedtime as needed for mild constipation.   Spiriva HandiHaler 18 MCG inhalation capsule Generic drug: tiotropium Place 18 mcg into inhaler and inhale every evening.   Vitamin D3 25 MCG (1000 UT) Caps Take 2,000 Units by mouth daily.       Discharge Assessment: Vitals:   08/21/19 0548 08/21/19 0841  BP: 130/72   Pulse: 95   Resp: 16   Temp: 98.3 F (36.8 C)   SpO2: 98% 96%   Skin clean, dry and intact without  evidence of skin break down, no evidence of skin tears noted. IV catheter discontinued intact. Site without signs and symptoms of complications - no redness or edema noted at insertion site, patient denies c/o pain - only slight tenderness at site.  Dressing with slight pressure applied.  D/c Instructions-Education: Discharge instructions given to patient/family with verbalized understanding. D/c education completed with patient/family including follow up instructions, medication list, d/c activities limitations if indicated, with other d/c instructions as indicated by MD - patient able to verbalize understanding, all questions fully answered. Patient instructed  to return to ED, call 911, or call MD for any changes in condition.  Patient escorted via [place], and D/C home via private auto.  Zachery Conch, RN 08/21/2019 2:17 PM

## 2019-08-21 NOTE — Consult Note (Signed)
Consultation Note Date: 08/21/2019   Patient Name: Stacey Nash  DOB: [birth year]  MRN: [id number]  Age / Sex: 69 y.o., female  PCP: Celene Squibb, MD Referring Physician: Rodena Goldmann, DO  Reason for Consultation: Establishing goals of care  HPI/Patient Profile: 69 y.o. female  with past medical history of COPD on home oxygen, Crohn's disease, HTN, 50 pack year smoker, recently discovered liver mass with biopsy planned at [facility] 08/21/19 admitted on 08/19/2019 with right side and right chest wall pain with fever, weight loss, and fatigue. Concern for lung cancer with liver mets per CT scan and oncology consulted and following. No infection identified and fevers thought to be related to likely malignancy.   Clinical Assessment and Goals of Care: I have reviewed records and discussed with Dr. Manuella Ghazi.   I met today with Stacey Nash. No family currently at bedside. She is a lovely lady unfortunate circumstances of newly discovered lung mass with concern for liver mets. Stacey Nash has good insight into what this could mean and has discussed an initial work up plan with Dr. Delton Coombes. Plans for brain MRI today and scheduled for biopsy outpatient tomorrow. She plans to meet with Dr. Delton Coombes next week to follow up on biopsy results and options.   I inquired to how Stacey Nash is handling all this new and overwhelming information. She shares that she likes to just take one day at a time and finds strength through her faith. Noted that she has her bible in her lap. She shares that she has a wonderful and supportive family. She lives with her son (has 3 sons in total) and has a 28 yo mother that she is extremely close with. She is most worried about her family and especially her mother. She is waiting for more information before sharing with her mother.   She shares with me that she has a cousin who went through cancer  treatment that lived next door to her. She knows that she will have a lot of thinking and praying to weigh her options. She does report to me that she will have to weigh carefully if it will be worth it for her to go through aggressive treatment. She trusts Dr. Delton Coombes to help her with these options. She asks about assistance to help with her pain and comfort if she foregoes treatment and I reassure her that we will support her and try and optimize her comfort and quality of life whatever decision she makes. Although if she does decide to forego therapy symptom management and support at home is often provided by the help of hospice which is a scary word/though but they can be extremely supportive and helpful when needed. She verbalizes understanding.   Overall Stacey Nash is pleased with her quality of life and functional status appears to be very good. She is having increasing RUQ abd/flank pain throbbing/cramping that on occasion radiates into her back. She had a terrible night with pain and is hoping for better pain control moving forward. I  told her that I will look and make some recommendations.   All questions/concerns addressed. Emotional support provided. Appreciate consultation with this lovely woman. She has strong faith and supportive family and I believe with some time she will be able to determine her wishes and goals of care.   Primary Decision Maker PATIENT    SUMMARY OF RECOMMENDATIONS   - Work up ongoing for lung and liver masses - Could benefit from ongoing palliative consultation after work up and discussions with oncology - Will need ongoing pain management  Code Status/Advance Care Planning:  Full code   Symptom Management:   Right sided pain upper abd/lower chest: OxyIR 5 mg po every 8 hours scheduled (consider increasing). I have addded OxyIR 5 mg po every 4 hours as needed between scheduled doses for breakthrough pain. Anticipate she will likely benefit from low dose  long acting opioid eventually.   Bowel Regimen: Discussed concern for constipation given increased opioid use. Will schedule Miralax daily for constipation prevention (she may titrate at home if not needed daily). Senokot as needed recommended.   Palliative Prophylaxis:   Bowel Regimen and Frequent Pain Assessment  Psycho-social/Spiritual:   Desire for further Chaplaincy support:yes  Prognosis:   To be determined.   Discharge Planning: Home with Home Health and consider outpatient palliative follow up     Primary Diagnoses: Present on Admission: . Lung cancer ([place]) . Crohn's colitis ([place]) . Essential hypertension, benign . Tobacco abuse   I have reviewed the medical record, interviewed the patient and family, and examined the patient. The following aspects are pertinent.  Past Medical History:  Diagnosis Date  . Allergy or intolerance to drug    Doxycycline caused worsening shortness of breath and loose stools.  . Asthma   . Chronic neck pain   . COPD (chronic obstructive pulmonary disease) ([place])   . Crohn's colitis ([place]) 10/03/2018  . Diverticula of intestine   . Hypertension   . Left adrenal mass ([place])   . Osteoporosis   . Tobacco abuse    Social History   Socioeconomic History  . Marital status: Widowed    Spouse name: Not on file  . Number of children: Not on file  . Years of education: Not on file  . Highest education level: Not on file  Occupational History  . Not on file  Tobacco Use  . Smoking status: Former Smoker    Packs/day: 2.00    Years: 50.00    Pack years: 100.00    Types: Cigarettes    Quit date: 09/29/2013    Years since quitting: 5.8  . Smokeless tobacco: Former Systems developer    Quit date: 09/05/2013  . Tobacco comment: quit 4 yrs ago.   Substance and Sexual Activity  . Alcohol use: No    Alcohol/week: 0.0 standard drinks  . Drug use: No  . Sexual activity: Not Currently  Other Topics Concern  . Not on file  Social History Narrative    Retired from book keeping and taxes.   Single, divorced.   3 boys.    Enjoys puzzles, reading, shopping.   Does not attend church, but watches tv church.   Does not eat red meat. Does not eat eggs or drink dairy.   Drives.   Wear seatbelt.    Youngest son lives with her.    Has grandchildren.    Social Determinants of Health   Financial Resource Strain:   . Difficulty of Paying Living Expenses:   Food Insecurity:   .  Worried About Charity fundraiser in the Last Year:   . Arboriculturist in the Last Year:   Transportation Needs:   . Film/video editor (Medical):   Marland Kitchen Lack of Transportation (Non-Medical):   Physical Activity:   . Days of Exercise per Week:   . Minutes of Exercise per Session:   Stress:   . Feeling of Stress :   Social Connections:   . Frequency of Communication with Friends and Family:   . Frequency of Social Gatherings with Friends and Family:   . Attends Religious Services:   . Active Member of Clubs or Organizations:   . Attends Archivist Meetings:   Marland Kitchen Marital Status:    Family History  Problem Relation Age of Onset  . Hypertension Mother   . Thyroid disease Mother   . Diabetes Father   . Alcoholism Father   . Crohn's disease Brother   . Hypertension Son    Scheduled Meds: . amLODipine  5 mg Oral QHS  . feeding supplement (ENSURE ENLIVE)  237 mL Oral BID BM  . heparin  5,000 Units Subcutaneous Q8H  . multivitamin with minerals  1 tablet Oral Daily  . oxyCODONE  5 mg Oral Q8H  . propranolol  5 mg Oral BID  . umeclidinium bromide  1 puff Inhalation QPM   Continuous Infusions: . cefTRIAXone (ROCEPHIN)  IV 1 g (08/20/19 1800)   PRN Meds:.acetaminophen, albuterol, diclofenac Sodium, fentaNYL (SUBLIMAZE) injection, hydrALAZINE, polyethylene glycol Allergies  Allergen Reactions  . Sulfa Antibiotics Shortness Of Breath  . Tetanus Toxoids Rash  . Doxycycline Diarrhea  . Statins     Leg pain and weakness  . Zofran [Ondansetron Hcl]  Hives and Rash    Rash at injection site that spread immediately following administration     Review of Systems  Constitutional: Positive for activity change, appetite change and fatigue.  Gastrointestinal: Positive for abdominal pain. Negative for constipation.    Physical Exam Vitals and nursing note reviewed.  Constitutional:      General: She is not in acute distress.    Comments: Thin, frail  Cardiovascular:     Rate and Rhythm: Normal rate.  Pulmonary:     Effort: No tachypnea, accessory muscle usage or respiratory distress.  Abdominal:     Palpations: Abdomen is soft.     Tenderness: There is abdominal tenderness in the right upper quadrant.  Neurological:     Mental Status: She is alert and oriented to person, place, and time.     Vital Signs: BP 130/72 (BP Location: Right Arm)   Pulse 95   Temp 98.3 F (36.8 C) (Oral)   Resp 16   Ht 5' 2"  (1.575 m)   Wt 51.5 kg   SpO2 96%   BMI 20.77 kg/m  Pain Scale: 0-10   Pain Score: 10-Worst pain ever   SpO2: SpO2: 96 % O2 Device:SpO2: 96 % O2 Flow Rate: .O2 Flow Rate (L/min): 2 L/min  IO: Intake/output summary:   Intake/Output Summary (Last 24 hours) at 08/21/2019 5027 Last data filed at 08/21/2019 0300 Gross per 24 hour  Intake 200 ml  Output --  Net 200 ml    LBM:   Baseline Weight: Weight: 56.7 kg Most recent weight: Weight: 51.5 kg     Palliative Assessment/Data:     Time In: 0925 Time Out: 1015 Time Total: 50 min Greater than 50%  of this time was spent counseling and coordinating care related to the  above assessment and plan.  Signed by: Vinie Sill, NP Palliative Medicine Team Pager # [phone] (M-F 8a-5p) Team Phone # [phone] (Nights/Weekends)

## 2019-08-21 NOTE — Plan of Care (Signed)

## 2019-08-22 ENCOUNTER — Other Ambulatory Visit: Payer: Self-pay

## 2019-08-22 ENCOUNTER — Ambulatory Visit (HOSPITAL_COMMUNITY)
Admission: RE | Admit: 2019-08-22 | Discharge: 2019-08-22 | Disposition: A | Discharge status: Home / Self Care | Payer: Medicare Other | Source: Ambulatory Visit | Attending: Internal Medicine | Admitting: Internal Medicine

## 2019-08-22 ENCOUNTER — Encounter (HOSPITAL_COMMUNITY): Payer: Self-pay

## 2019-08-22 ENCOUNTER — Other Ambulatory Visit (HOSPITAL_COMMUNITY): Payer: Self-pay | Admitting: Nurse Practitioner

## 2019-08-22 DIAGNOSIS — C801 Malignant (primary) neoplasm, unspecified: Secondary | ICD-10-CM | POA: Diagnosis not present

## 2019-08-22 DIAGNOSIS — Z888 Allergy status to other drugs, medicaments and biological substances status: Secondary | ICD-10-CM | POA: Insufficient documentation

## 2019-08-22 DIAGNOSIS — R932 Abnormal findings on diagnostic imaging of liver and biliary tract: Secondary | ICD-10-CM | POA: Diagnosis not present

## 2019-08-22 DIAGNOSIS — Z8349 Family history of other endocrine, nutritional and metabolic diseases: Secondary | ICD-10-CM | POA: Insufficient documentation

## 2019-08-22 DIAGNOSIS — Z882 Allergy status to sulfonamides status: Secondary | ICD-10-CM | POA: Diagnosis not present

## 2019-08-22 DIAGNOSIS — C771 Secondary and unspecified malignant neoplasm of intrathoracic lymph nodes: Secondary | ICD-10-CM | POA: Insufficient documentation

## 2019-08-22 DIAGNOSIS — R918 Other nonspecific abnormal finding of lung field: Secondary | ICD-10-CM | POA: Diagnosis not present

## 2019-08-22 DIAGNOSIS — Z833 Family history of diabetes mellitus: Secondary | ICD-10-CM | POA: Insufficient documentation

## 2019-08-22 DIAGNOSIS — J449 Chronic obstructive pulmonary disease, unspecified: Secondary | ICD-10-CM | POA: Insufficient documentation

## 2019-08-22 DIAGNOSIS — Z8379 Family history of other diseases of the digestive system: Secondary | ICD-10-CM | POA: Diagnosis not present

## 2019-08-22 DIAGNOSIS — K5090 Crohn's disease, unspecified, without complications: Secondary | ICD-10-CM | POA: Diagnosis not present

## 2019-08-22 DIAGNOSIS — C3411 Malignant neoplasm of upper lobe, right bronchus or lung: Secondary | ICD-10-CM

## 2019-08-22 DIAGNOSIS — Z9981 Dependence on supplemental oxygen: Secondary | ICD-10-CM | POA: Diagnosis not present

## 2019-08-22 DIAGNOSIS — R160 Hepatomegaly, not elsewhere classified: Secondary | ICD-10-CM | POA: Diagnosis not present

## 2019-08-22 DIAGNOSIS — R1010 Upper abdominal pain, unspecified: Secondary | ICD-10-CM | POA: Diagnosis not present

## 2019-08-22 DIAGNOSIS — Z881 Allergy status to other antibiotic agents status: Secondary | ICD-10-CM | POA: Insufficient documentation

## 2019-08-22 DIAGNOSIS — Z8249 Family history of ischemic heart disease and other diseases of the circulatory system: Secondary | ICD-10-CM | POA: Insufficient documentation

## 2019-08-22 DIAGNOSIS — Z0001 Encounter for general adult medical examination with abnormal findings: Secondary | ICD-10-CM | POA: Diagnosis not present

## 2019-08-22 DIAGNOSIS — K769 Liver disease, unspecified: Secondary | ICD-10-CM | POA: Diagnosis present

## 2019-08-22 DIAGNOSIS — I10 Essential (primary) hypertension: Secondary | ICD-10-CM | POA: Insufficient documentation

## 2019-08-22 DIAGNOSIS — Z79899 Other long term (current) drug therapy: Secondary | ICD-10-CM | POA: Diagnosis not present

## 2019-08-22 DIAGNOSIS — C3491 Malignant neoplasm of unspecified part of right bronchus or lung: Secondary | ICD-10-CM | POA: Diagnosis not present

## 2019-08-22 DIAGNOSIS — E43 Unspecified severe protein-calorie malnutrition: Secondary | ICD-10-CM | POA: Diagnosis not present

## 2019-08-22 DIAGNOSIS — Z87891 Personal history of nicotine dependence: Secondary | ICD-10-CM | POA: Insufficient documentation

## 2019-08-22 DIAGNOSIS — R634 Abnormal weight loss: Secondary | ICD-10-CM | POA: Diagnosis not present

## 2019-08-22 DIAGNOSIS — C787 Secondary malignant neoplasm of liver and intrahepatic bile duct: Secondary | ICD-10-CM | POA: Diagnosis not present

## 2019-08-22 DIAGNOSIS — R110 Nausea: Secondary | ICD-10-CM | POA: Diagnosis not present

## 2019-08-22 DIAGNOSIS — Z887 Allergy status to serum and vaccine status: Secondary | ICD-10-CM | POA: Insufficient documentation

## 2019-08-22 DIAGNOSIS — Z1289 Encounter for screening for malignant neoplasm of other sites: Secondary | ICD-10-CM

## 2019-08-22 HISTORY — DX: Dependence on supplemental oxygen: Z99.81

## 2019-08-22 HISTORY — DX: Dyspnea, unspecified: R06.00

## 2019-08-22 LAB — CBC
HCT: 35.3 % — ABNORMAL LOW (ref 36.0–46.0)
Hemoglobin: 11 g/dL — ABNORMAL LOW (ref 12.0–15.0)
MCH: 27.6 pg (ref 26.0–34.0)
MCHC: 31.2 g/dL (ref 30.0–36.0)
MCV: 88.5 fL (ref 80.0–100.0)
Platelets: 568 10*3/uL — ABNORMAL HIGH (ref 150–400)
RBC: 3.99 MIL/uL (ref 3.87–5.11)
RDW: 14 % (ref 11.5–15.5)
WBC: 14.7 10*3/uL — ABNORMAL HIGH (ref 4.0–10.5)
nRBC: 0 % (ref 0.0–0.2)

## 2019-08-22 LAB — PROTIME-INR
INR: 1.2 (ref 0.8–1.2)
Prothrombin Time: 15.1 seconds (ref 11.4–15.2)

## 2019-08-22 MED ORDER — LIDOCAINE HCL 1 % IJ SOLN
INTRAMUSCULAR | Status: AC
  Filled 2019-08-22: qty 20

## 2019-08-22 MED ORDER — FENTANYL CITRATE (PF) 100 MCG/2ML IJ SOLN
INTRAMUSCULAR | Status: AC
  Filled 2019-08-22: qty 2

## 2019-08-22 MED ORDER — FENTANYL CITRATE (PF) 100 MCG/2ML IJ SOLN
INTRAMUSCULAR | Status: AC | PRN
  Administered 2019-08-22 (×2): 50 ug via INTRAVENOUS

## 2019-08-22 MED ORDER — MIDAZOLAM HCL 2 MG/2ML IJ SOLN
INTRAMUSCULAR | Status: AC | PRN
  Administered 2019-08-22: 1 mg via INTRAVENOUS
  Administered 2019-08-22: 0.5 mg via INTRAVENOUS

## 2019-08-22 MED ORDER — MIDAZOLAM HCL 2 MG/2ML IJ SOLN
INTRAMUSCULAR | Status: AC
  Filled 2019-08-22: qty 2

## 2019-08-22 MED ORDER — SODIUM CHLORIDE 0.9 % IV SOLN: INTRAVENOUS | Status: DC

## 2019-08-22 MED ORDER — OXYCODONE HCL 5 MG PO TABS: 5.0000 mg | ORAL_TABLET | Freq: Four times a day (QID) | ORAL | 0 refills | Status: DC | PRN

## 2019-08-22 MED ORDER — GELATIN ABSORBABLE 12-7 MM EX MISC
CUTANEOUS | Status: AC
  Filled 2019-08-22: qty 1

## 2019-08-22 NOTE — Procedures (Signed)
Interventional Radiology Procedure Note  Procedure: US guided core biopsy of liver lesion  Complications: None  Estimated Blood Loss: None  Recommendations: - Bedrest x 2 hrs - Path sent    Signed,  Criselda Peaches, MD

## 2019-08-22 NOTE — Discharge Instructions (Signed)
Liver Biopsy, Care After These instructions give you information on caring for yourself after your procedure. Your doctor may also give you more specific instructions. Call your doctor if you have any problems or questions after your procedure. What can I expect after the procedure? After the procedure, it is common to have:  Pain and soreness where the biopsy was done.  Bruising around the area where the biopsy was done.  Sleepiness and be tired for a few days. Follow these instructions at home: Medicines  Take over-the-counter and prescription medicines only as told by your doctor.  If you were prescribed an antibiotic medicine, take it as told by your doctor. Do not stop taking the antibiotic even if you start to feel better.  Do not take medicines such as aspirin and ibuprofen. These medicines can thin your blood. Do not take these medicines unless your doctor tells you to take them.  If you are taking prescription pain medicine, take actions to prevent or treat constipation. Your doctor may recommend that you: ? Drink enough fluid to keep your pee (urine) clear or pale yellow. ? Take over-the-counter or prescription medicines. ? Eat foods that are high in fiber, such as fresh fruits and vegetables, whole grains, and beans. ? Limit foods that are high in fat and processed sugars, such as fried and sweet foods. Caring for your cut  Follow instructions from your doctor about how to take care of your cuts from surgery (incisions). Make sure you: ? Wash your hands with soap and water before you change your bandage (dressing). If you cannot use soap and water, use hand sanitizer. ? Change your bandage as told by your doctor. ? Leave stitches (sutures), skin glue, or skin tape (adhesive) strips in place. They may need to stay in place for 2 weeks or longer. If tape strips get loose and curl up, you may trim the loose edges. Do not remove tape strips completely unless your doctor says it is  okay.  Check your cuts every day for signs of infection. Check for: ? Redness, swelling, or more pain. ? Fluid or blood. ? Pus or a bad smell. ? Warmth.  Do not take baths, swim, or use a hot tub until your doctor says it is okay to do so. Activity   Rest at home for 1-2 days or as told by your doctor. ? Avoid sitting for a long time without moving. Get up to take short walks every 1-2 hours.  Return to your normal activities as told by your doctor. Ask what activities are safe for you.  Do not do these things in the first 24 hours: ? Drive. ? Use machinery. ? Take a bath or shower.  Do not lift more than 10 pounds (4.5 kg) or play contact sports for the first 2 weeks. General instructions   Do not drink alcohol in the first week after the procedure.  Have someone stay with you for at least 24 hours after the procedure.  Get your test results. Ask your doctor or the department that is doing the test: ? When will my results be ready? ? How will I get my results? ? What are my treatment options? ? What other tests do I need? ? What are my next steps?  Keep all follow-up visits as told by your doctor. This is important. Contact a doctor if:  A cut bleeds and leaves more than just a small spot of blood.  A cut is red, puffs up (  swells), or hurts more than before.  Fluid or something else comes from a cut.  A cut smells bad.  You have a fever or chills. Get help right away if:  You have swelling, bloating, or pain in your belly (abdomen).  You get dizzy or faint.  You have a rash.  You feel sick to your stomach (nauseous) or throw up (vomit).  You have trouble breathing, feel short of breath, or feel faint.  Your chest hurts.  You have problems talking or seeing.  You have trouble with your balance or moving your arms or legs. Summary  After the procedure, it is common to have pain, soreness, bruising, and tiredness.  Your doctor will tell you how to  take care of yourself at home. Change your bandage, take your medicines, and limit your activities as told by your doctor.  Call your doctor if you have symptoms of infection. Get help right away if your belly swells, your cut bleeds a lot, or you have trouble talking or breathing. This information is not intended to replace advice given to you by your health care provider. Make sure you discuss any questions you have with your health care provider. Document Revised: 05/25/2017 Document Reviewed: 05/25/2017 Elsevier Patient Education  [place]. Moderate Conscious Sedation, Adult, Care After These instructions provide you with information about caring for yourself after your procedure. Your health care provider may also give you more specific instructions. Your treatment has been planned according to current medical practices, but problems sometimes occur. Call your health care provider if you have any problems or questions after your procedure. What can I expect after the procedure? After your procedure, it is common:  To feel sleepy for several hours.  To feel clumsy and have poor balance for several hours.  To have poor judgment for several hours.  To vomit if you eat too soon. Follow these instructions at home: For at least 24 hours after the procedure:   Do not: ? Participate in activities where you could fall or become injured. ? Drive. ? Use heavy machinery. ? Drink alcohol. ? Take sleeping pills or medicines that cause drowsiness. ? Make important decisions or sign legal documents. ? Take care of children on your own.  Rest. Eating and drinking  Follow the diet recommended by your health care provider.  If you vomit: ? Drink water, juice, or soup when you can drink without vomiting. ? Make sure you have little or no nausea before eating solid foods. General instructions  Have a responsible adult stay with you until you are awake and alert.  Take  over-the-counter and prescription medicines only as told by your health care provider.  If you smoke, do not smoke without supervision.  Keep all follow-up visits as told by your health care provider. This is important. Contact a health care provider if:  You keep feeling nauseous or you keep vomiting.  You feel light-headed.  You develop a rash.  You have a fever. Get help right away if:  You have trouble breathing. This information is not intended to replace advice given to you by your health care provider. Make sure you discuss any questions you have with your health care provider. Document Revised: 04/27/2017 Document Reviewed: 09/04/2015 Elsevier Patient Education  2020 [employer].

## 2019-08-22 NOTE — Consult Note (Signed)
Chief Complaint: Patient was seen in consultation today for image guided liver lesion biopsy  Referring Physician(s): [place]  Supervising Physician: Jacqulynn Cadet  Patient Status: [facility] - Out-pt  History of Present Illness: Stacey Nash is a 69 y.o. female , ex-smoker, with history of asthma, COPD on home O2, Crohn's disease, diverticulosis, hypertension and recent imaging findings of right upper lobe lung mass with thoracic nodal metastases, innumerable liver lesions, benign bilateral adrenal nodules/masses.  She has no prior history of cancer.  She presents today for image guided liver lesion biopsy for further evaluation.  Past Medical History:  Diagnosis Date  . Allergy or intolerance to drug    Doxycycline caused worsening shortness of breath and loose stools.  . Asthma   . Chronic neck pain   . COPD (chronic obstructive pulmonary disease) ([place])   . Crohn's colitis ([place]) 10/03/2018  . Diverticula of intestine   . Dyspnea   . Hypertension   . Left adrenal mass ([place])   . On home oxygen therapy   . Osteoporosis   . Tobacco abuse     Past Surgical History:  Procedure Laterality Date  . APPENDECTOMY     2013  . BIOPSY  10/01/2017   Procedure: BIOPSY;  Surgeon: Rogene Houston, MD;  Location: [facility] ENDO SUITE;  Service: Endoscopy;;  left and right colon  . CHOLECYSTECTOMY    . COLONOSCOPY WITH PROPOFOL N/A 10/01/2017   Procedure: COLONOSCOPY WITH PROPOFOL;  Surgeon: Rogene Houston, MD;  Location: [facility] END[facility] SUITE;  Service: Endoscopy;  Laterality: N/A;  . ESOPHAGOGASTRODUODENOSCOPY N/A 09/09/2014   Procedure: ESOPHAGOGASTRODUODENOSCOPY (EGD);  Surgeon: Rogene Houston, MD;  Location: [facility] END[facility] SU[facility];  Service: Endoscopy;  Laterality: N/A;  210 - moved to 9:10 - Ann to notify pt  . EYE SURGERY     cataract/implants  . LAPAROSCOPIC APPENDECTOMY  07/14/2011   Procedure: APPENDECTOMY LAPAROSCOPIC;  Surgeon: Donato Heinz, MD;  Location: [facility] ORS;  Service: General;   Laterality: N/A;  . POLYPECTOMY  10/01/2017   Procedure: POLYPECTOMY;  Surgeon: Rogene Houston, MD;  Location: [facility] END[facility];  Service: Endoscopy;;  rectal    Allergies: Sulfa antibiotics, Tetanus toxoids, Doxycycline, Statins, and Zofran [ondansetron hcl]  Medications: Prior to Admission medications   Medication Sig Start Date End Date Taking? Authorizing Provider  acetaminophen (TYLENOL) 500 MG tablet Take 1,000 mg by mouth daily as needed for mild pain, moderate pain or headache.    Yes [provider]  albuterol (PROVENTIL HFA;VENTOLIN HFA) 108 (90 BASE) MCG/ACT inhaler Inhale 2 puffs into the lungs every 6 (six) hours as needed for wheezing or shortness of breath.    Yes [provider]  albuterol (PROVENTIL) (2.5 MG/3ML) 0.083% nebulizer solution Take 2.5 mg by nebulization every 6 (six) hours as needed for wheezing or shortness of breath.   Yes [provider]  aluminum hydroxide-magnesium carbonate (GAVISCON) 95-358 MG/15ML SUSP Take 15 mLs by mouth 3 (three) times daily as needed for indigestion or heartburn.    Yes [provider]  amLODipine (NORVASC) 5 MG tablet Take 1 tablet (5 mg total) by mouth daily. 02/14/18  Yes Cassandria Anger, MD  Calcium Carb-Cholecalciferol (CALCIUM 600 + D PO) Take 1 tablet by mouth daily.    Yes [provider]  Cholecalciferol (VITAMIN D3) 25 MCG (1000 UT) CAPS Take 2,000 Units by mouth daily.    Yes [provider]  feeding supplement, ENSURE ENLIVE, (ENSURE ENLIVE) LIQD Take 237 mLs  by mouth 2 (two) times daily between meals. 08/21/19  Yes Shah, Pratik D, DO  fluticasone (FLONASE) 50 MCG/ACT nasal spray Place 1-2 sprays into both nostrils daily as needed. 04/28/19  Yes [provider]  loperamide (IMODIUM) 2 MG capsule Take 1 capsule (2 mg total) by mouth 4 (four) times daily as needed for diarrhea or loose stools. Patient taking differently: Take 2 mg by mouth daily as needed for  diarrhea or loose stools.  03/07/16  Yes Horton, Barbette Hair, MD  Multiple Vitamins-Minerals (CENTRUM SILVER ULTRA WOMENS PO) Take by mouth daily.   Yes [provider]  Omega-3 Fatty Acids (FISH OIL) 1000 MG CAPS Take 1,000 mg by mouth 2 (two) times daily.    Yes [provider]  oxyCODONE (OXY IR/ROXICODONE) 5 MG immediate release tablet Take 1 tablet (5 mg total) by mouth every 6 (six) hours as needed. 08/21/19  Yes Shah, Pratik D, DO  OXYGEN Inhale 2 L into the lungs daily.   Yes [provider]  propranolol (INDERAL) 10 MG tablet TAKE 1 TABLET BY MOUTH TWICE A DAY Patient taking differently: Take 5 mg by mouth daily as needed. 1/2 tab at night 03/06/18  Yes Nida, Marella Chimes, MD  tiotropium (SPIRIVA HANDIHALER) 18 MCG inhalation capsule Place 18 mcg into inhaler and inhale every evening.    Yes [provider]  Menthol, Topical Analgesic, (ICY HOT EX) Apply 1 application topically daily as needed (pain).    [provider]  polyethylene glycol (MIRALAX / GLYCOLAX) 17 g packet Take 17 g by mouth daily. 08/22/19   Manuella Ghazi, Pratik D, DO  promethazine (PHENERGAN) 25 MG tablet Take 12.5-25 mg by mouth every 6 (six) hours as needed. 08/06/19   [provider]  senna (SENOKOT) 8.6 MG TABS tablet Take 1 tablet (8.6 mg total) by mouth at bedtime as needed for mild constipation. 08/21/19   Heath Lark D, DO     Family History  Problem Relation Age of Onset  . Hypertension Mother   . Thyroid disease Mother   . Diabetes Father   . Alcoholism Father   . Crohn's disease Brother   . Hypertension Son     Social History   Socioeconomic History  . Marital status: Widowed    Spouse name: Not on file  . Number of children: Not on file  . Years of education: Not on file  . Highest education level: Not on file  Occupational History  . Not on file  Tobacco Use  . Smoking status: Former Smoker    Packs/day: 2.00    Years: 50.00    Pack years:  100.00    Types: Cigarettes    Quit date: 09/29/2013    Years since quitting: 5.8  . Smokeless tobacco: Never Used  . Tobacco comment: quit 4 yrs ago.   Substance and Sexual Activity  . Alcohol use: No    Alcohol/week: 0.0 standard drinks  . Drug use: No  . Sexual activity: Not Currently  Other Topics Concern  . Not on file  Social History Narrative   Retired from book keeping and taxes.   Single, divorced.   3 boys.    Enjoys puzzles, reading, shopping.   Does not attend church, but watches tv church.   Does not eat red meat. Does not eat eggs or drink dairy.   Drives.   Wear seatbelt.    Youngest son lives with her.    Has grandchildren.    Social Determinants  of Health   Financial Resource Strain:   . Difficulty of Paying Living Expenses:   Food Insecurity:   . Worried About Charity fundraiser in the Last Year:   . Arboriculturist in the Last Year:   Transportation Needs:   . Film/video editor (Medical):   Marland Kitchen Lack of Transportation (Non-Medical):   Physical Activity:   . Days of Exercise per Week:   . Minutes of Exercise per Session:   Stress:   . Feeling of Stress :   Social Connections:   . Frequency of Communication with Friends and Family:   . Frequency of Social Gatherings with Friends and Family:   . Attends Religious Services:   . Active Member of Clubs or Organizations:   . Attends Archivist Meetings:   Marland Kitchen Marital Status:       Review of Systems currently denies fever, headache, chest pain, cough, vomiting or bleeding.  She does have some dyspnea with exertion, weakness, right upper quadrant/flank discomfort, nausea, weight loss and is hard of hearing  Vital Signs: BP (!) 159/77 (BP Location: Right Arm)   Pulse 98   Temp 98.2 F (36.8 C) (Oral)   Resp 18   SpO2 95%   Physical Exam awake, alert.  Chest with distant breath sounds bilaterally.  Heart with regular rate and rhythm.  Abdomen soft, positive bowel sounds, tender right upper  quadrant/right flank region to palpation, bilateral pretibial edema noted.  Imaging: CT ABDOMEN PELVIS W WO CONTRAST  Result Date: 08/19/2019 CLINICAL DATA:  Abnormal chest radiograph. Liver lesions on outside hospital CT abdomen/pelvis. EXAM: CT CHEST WITH CONTRAST CT ABDOMEN AND PELVIS WITH AND WITHOUT CONTRAST TECHNIQUE: Multidetector CT imaging of the chest was performed during intravenous contrast administration. Multidetector CT imaging of the abdomen and pelvis was performed following the standard protocol before and during bolus administration of intravenous contrast. CONTRAST:  130m OMNIPAQUE IOHEXOL 300 MG/ML  SOLN COMPARISON:  Chest radiographs dated 08/19/2019. CT abdomen/pelvis dated 09/21/2016. FINDINGS: CT CHEST FINDINGS Cardiovascular: Heart is normal in size.  No pericardial effusion. No evidence of thoracic aortic aneurysm. Atherosclerotic calcifications of the aortic arch. Mild coronary atherosclerosis the LAD and right coronary artery. Mediastinum/Nodes: 8 mm short axis low right paratracheal node (series 9/image 45). 16 mm short axis right hilar node (series 9/image 49). 10 mm short axis right perihilar node (series 9/image 42). Visualized thyroid is notable for a 5 mm right thyroid nodule (series 9/image 15). No followup recommended (ref: J Am Coll Radiol. 2015 Feb;12(2): 143-50). Lungs/Pleura: 2.7 x 3.8 x 2.6 cm spiculated right upper lobe mass (series 11/image 63), compatible with primary bronchogenic neoplasm. Mild scarring/atelectasis in the medial right middle lobe and posterior lingula. No focal consolidation. Moderate centrilobular and paraseptal emphysematous changes, upper lung predominant. Mild biapical pleural-parenchymal scarring, right greater than left. No pleural effusion or pneumothorax. Musculoskeletal: No focal osseous lesions. CT ABDOMEN AND PELVIS FINDINGS Hepatobiliary: Numerous hepatic metastases throughout both lobes. Index lesions include: --2.2 x 2.8 cm lesion in  segment 8 (series 9/image 82) --1.6 x 2.0 cm lesion in segment 2 (series 9/image 88) --3.9 x 5.1 cm lesion in segment 7 (series 9/image 89) --1.8 x 2.1 cm lesion in segment 3 (series 9/image 111) Status post cholecystectomy. No intrahepatic or extrahepatic ductal dilatation. Pancreas: Within normal limits. Spleen: Within normal limits. Adrenals/Urinary Tract: 3.2 x 3.5 cm left adrenal mass containing macroscopic fat and coarse calcifications, previously 3.3 x 3.4 cm in 2018. 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 Regardless, given stability, this can be considered benign. Stable mild nodularity right adrenal gland measuring up to 9 mm (series 9/image 90), unchanged, benign. Kidneys are within normal limits.  No hydronephrosis. Bladder is within normal limits. Stomach/Bowel: Stomach is within normal limits. No evidence of bowel obstruction. Prior appendectomy. Sigmoid diverticulosis, without evidence of diverticulitis. Vascular/Lymphatic: No evidence of abdominal aortic aneurysm. Atherosclerotic calcifications of the abdominal aorta and branch vessels. No suspicious abdominopelvic lymphadenopathy. Reproductive: Uterus is within normal limits. No adnexal masses. Other: No abdominopelvic ascites. Musculoskeletal: No focal osseous lesions. IMPRESSION: 3.8 cm spiculated right upper lobe mass, compatible with primary bronchogenic neoplasm. Associated thoracic nodal metastases, as above. Innumerable hepatic metastases in both lobes, with index lesions as above. Benign bilateral adrenal nodules/masses. Patient is scheduled for tissue sampling of the suspected hepatic metastases. PET-CT may also be beneficial for staging, as clinically warranted. Electronically Signed   By: Julian Hy M.D.   On: 08/19/2019 15:35   CT Chest W Contrast  Result Date: 08/19/2019 CLINICAL DATA:  Abnormal chest radiograph. Liver lesions on outside hospital CT abdomen/pelvis. EXAM: CT CHEST WITH CONTRAST CT ABDOMEN AND PELVIS  WITH AND WITHOUT CONTRAST TECHNIQUE: Multidetector CT imaging of the chest was performed during intravenous contrast administration. Multidetector CT imaging of the abdomen and pelvis was performed following the standard protocol before and during bolus administration of intravenous contrast. CONTRAST:  115m OMNIPAQUE IOHEXOL 300 MG/ML  SOLN COMPARISON:  Chest radiographs dated 08/19/2019. CT abdomen/pelvis dated 09/21/2016. FINDINGS: CT CHEST FINDINGS Cardiovascular: Heart is normal in size.  No pericardial effusion. No evidence of thoracic aortic aneurysm. Atherosclerotic calcifications of the aortic arch. Mild coronary atherosclerosis the LAD and right coronary artery. Mediastinum/Nodes: 8 mm short axis low right paratracheal node (series 9/image 45). 16 mm short axis right hilar node (series 9/image 49). 10 mm short axis right perihilar node (series 9/image 42). Visualized thyroid is notable for a 5 mm right thyroid nodule (series 9/image 15). No followup recommended (ref: J Am Coll Radiol. 2015 Feb;12(2): 143-50). Lungs/Pleura: 2.7 x 3.8 x 2.6 cm spiculated right upper lobe mass (series 11/image 63), compatible with primary bronchogenic neoplasm. Mild scarring/atelectasis in the medial right middle lobe and posterior lingula. No focal consolidation. Moderate centrilobular and paraseptal emphysematous changes, upper lung predominant. Mild biapical pleural-parenchymal scarring, right greater than left. No pleural effusion or pneumothorax. Musculoskeletal: No focal osseous lesions. CT ABDOMEN AND PELVIS FINDINGS Hepatobiliary: Numerous hepatic metastases throughout both lobes. Index lesions include: --2.2 x 2.8 cm lesion in segment 8 (series 9/image 82) --1.6 x 2.0 cm lesion in segment 2 (series 9/image 88) --3.9 x 5.1 cm lesion in segment 7 (series 9/image 89) --1.8 x 2.1 cm lesion in segment 3 (series 9/image 111) Status post cholecystectomy. No intrahepatic or extrahepatic ductal dilatation. Pancreas: Within  normal limits. Spleen: Within normal limits. Adrenals/Urinary Tract: 3.2 x 3.5 cm left adrenal mass containing macroscopic fat and coarse calcifications, previously 3.3 x 3.4 cm in 2018. 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 Regardless, given stability, this can be considered benign. Stable mild nodularity right adrenal gland measuring up to 9 mm (series 9/image 90), unchanged, benign. Kidneys are within normal limits.  No hydronephrosis. Bladder is within normal limits. Stomach/Bowel: Stomach is within normal limits. No evidence of bowel obstruction. Prior appendectomy. Sigmoid diverticulosis, without evidence of diverticulitis. Vascular/Lymphatic: No evidence of abdominal aortic aneurysm. Atherosclerotic calcifications of the abdominal aorta and branch vessels. No suspicious abdominopelvic lymphadenopathy. Reproductive: Uterus is within normal limits. No  adnexal masses. Other: No abdominopelvic ascites. Musculoskeletal: No focal osseous lesions. IMPRESSION: 3.8 cm spiculated right upper lobe mass, compatible with primary bronchogenic neoplasm. Associated thoracic nodal metastases, as above. Innumerable hepatic metastases in both lobes, with index lesions as above. Benign bilateral adrenal nodules/masses. Patient is scheduled for tissue sampling of the suspected hepatic metastases. PET-CT may also be beneficial for staging, as clinically warranted. Electronically Signed   By: Julian Hy M.D.   On: 08/19/2019 15:35   DG Chest Port 1 View  Result Date: 08/19/2019 CLINICAL DATA:  Fever, shortness of breath. EXAM: PORTABLE CHEST 1 VIEW COMPARISON:  September 21, 2016. FINDINGS: The heart size and mediastinal contours are within normal limits. No pneumothorax or pleural effusion is noted. Left lung is clear. Large spiculated mass is noted in right upper lobe concerning for malignancy; CT scan of the chest with intravenous contrast is recommended for further evaluation. The visualized skeletal  structures are unremarkable. IMPRESSION: Large spiculated mass is noted in right upper lobe concerning for malignancy; CT scan of the chest with intravenous contrast is recommended for further evaluation. Electronically Signed   By: Marijo Conception M.D.   On: 08/19/2019 13:51    Labs:  CBC: Recent Labs    08/19/19 1340 08/20/19 0455 08/21/19 0437 08/22/19 1150  WBC 16.9* 16.5* 13.9* 14.7*  HGB 12.1 10.1* 10.2* 11.0*  HCT 38.2 33.0* 33.3* 35.3*  PLT 475* 455* 377 568*    COAGS: Recent Labs    08/19/19 1340  INR 1.3*    BMP: Recent Labs    08/19/19 1340 08/20/19 0455 08/21/19 0437  NA 134* 137 137  K 3.7 3.7 3.8  CL 101 103 102  CO2 22 22 24   GLUCOSE 136* 101* 112*  BUN 9 7* 11  CALCIUM 8.3* 8.0* 8.2*  CREATININE 0.55 0.48 0.49  GFRNONAA >60 >60 >60  GFRAA >60 >60 >60    LIVER FUNCTION TESTS: Recent Labs    08/19/19 1340 08/20/19 0455 08/21/19 0437  BILITOT 1.2 0.9 0.7  AST 60* 44* 34  ALT 30 24 21   ALKPHOS 361* 288* 270*  PROT 6.6 5.7* 5.7*  ALBUMIN 2.8* 2.4* 2.4*    TUMOR MARKERS: No results for input(s): AFPTM, CEA, CA199, CHROMGRNA in the last 8760 hours.  Assessment and Plan: 69 y.o. female , ex-smoker, with history of asthma, COPD on home O2,, Crohn's disease, diverticulosis, hypertension and recent imaging findings of right upper lobe lung mass with thoracic nodal metastases, innumerable liver lesions, benign bilateral adrenal nodules/masses.  She has no prior history of cancer.  She presents today for image guided liver lesion biopsy for further evaluation.Risks and benefits of procedure was discussed with the patient and/or patient's family including, but not limited to bleeding, infection, damage to adjacent structures or low yield requiring additional tests.  All of the questions were answered and there is agreement to proceed.  Consent signed and in chart.      Thank you for this interesting consult.  I greatly enjoyed meeting TINEY ZIPPER and look forward to participating in their care.  A copy of this report was sent to the requesting provider on this date.  Electronically Signed: D. Rowe Robert, PA-C 08/22/2019, 12:01 PM   I spent a total of  25 minutes   in face to face in clinical consultation, greater than 50% of which was counseling/coordinating care for image guided liver lesion biopsy

## 2019-08-24 LAB — CULTURE, BLOOD (ROUTINE X 2)
Culture: NO GROWTH
Culture: NO GROWTH
Special Requests: ADEQUATE
Special Requests: ADEQUATE

## 2019-08-25 ENCOUNTER — Encounter (HOSPITAL_COMMUNITY): Payer: Self-pay | Admitting: *Deleted

## 2019-08-25 DIAGNOSIS — K5090 Crohn's disease, unspecified, without complications: Secondary | ICD-10-CM | POA: Diagnosis not present

## 2019-08-25 DIAGNOSIS — R918 Other nonspecific abnormal finding of lung field: Secondary | ICD-10-CM | POA: Diagnosis not present

## 2019-08-25 DIAGNOSIS — R1010 Upper abdominal pain, unspecified: Secondary | ICD-10-CM | POA: Diagnosis not present

## 2019-08-25 DIAGNOSIS — R110 Nausea: Secondary | ICD-10-CM | POA: Diagnosis not present

## 2019-08-25 DIAGNOSIS — R932 Abnormal findings on diagnostic imaging of liver and biliary tract: Secondary | ICD-10-CM | POA: Diagnosis not present

## 2019-08-25 DIAGNOSIS — R634 Abnormal weight loss: Secondary | ICD-10-CM | POA: Diagnosis not present

## 2019-08-25 DIAGNOSIS — E43 Unspecified severe protein-calorie malnutrition: Secondary | ICD-10-CM | POA: Diagnosis not present

## 2019-08-25 DIAGNOSIS — J449 Chronic obstructive pulmonary disease, unspecified: Secondary | ICD-10-CM | POA: Diagnosis not present

## 2019-08-25 NOTE — Progress Notes (Signed)
Oncology Navigator Note:  Patient was referred to our office following a consult in the ER .  I called patient today to introduce myself and provide information in how I will be involved with her care.  I provided information on their her visit and what to expect.  I made sure patient was aware of appointment time and directions to the cancer center.  My phone number was given so that she can call me with any questions or concerns.  She voices appreciation and understanding.

## 2019-08-26 LAB — SURGICAL PATHOLOGY

## 2019-08-28 ENCOUNTER — Inpatient Hospital Stay (HOSPITAL_COMMUNITY): Payer: Medicare Other | Attending: Hematology | Admitting: Hematology

## 2019-08-28 ENCOUNTER — Other Ambulatory Visit: Payer: Self-pay

## 2019-08-28 ENCOUNTER — Encounter (HOSPITAL_COMMUNITY): Payer: Self-pay | Admitting: Hematology

## 2019-08-28 VITALS — BP 138/60 | HR 91 | Temp 97.3°F | Resp 18 | Wt 112.0 lb

## 2019-08-28 DIAGNOSIS — J449 Chronic obstructive pulmonary disease, unspecified: Secondary | ICD-10-CM | POA: Insufficient documentation

## 2019-08-28 DIAGNOSIS — Z87891 Personal history of nicotine dependence: Secondary | ICD-10-CM | POA: Insufficient documentation

## 2019-08-28 DIAGNOSIS — Z79899 Other long term (current) drug therapy: Secondary | ICD-10-CM | POA: Diagnosis not present

## 2019-08-28 DIAGNOSIS — M7989 Other specified soft tissue disorders: Secondary | ICD-10-CM | POA: Insufficient documentation

## 2019-08-28 DIAGNOSIS — C787 Secondary malignant neoplasm of liver and intrahepatic bile duct: Secondary | ICD-10-CM | POA: Insufficient documentation

## 2019-08-28 DIAGNOSIS — K5090 Crohn's disease, unspecified, without complications: Secondary | ICD-10-CM | POA: Insufficient documentation

## 2019-08-28 DIAGNOSIS — M810 Age-related osteoporosis without current pathological fracture: Secondary | ICD-10-CM | POA: Insufficient documentation

## 2019-08-28 DIAGNOSIS — R1011 Right upper quadrant pain: Secondary | ICD-10-CM | POA: Diagnosis not present

## 2019-08-28 DIAGNOSIS — C3491 Malignant neoplasm of unspecified part of right bronchus or lung: Secondary | ICD-10-CM | POA: Insufficient documentation

## 2019-08-28 DIAGNOSIS — C3411 Malignant neoplasm of upper lobe, right bronchus or lung: Secondary | ICD-10-CM | POA: Insufficient documentation

## 2019-08-28 DIAGNOSIS — I10 Essential (primary) hypertension: Secondary | ICD-10-CM | POA: Insufficient documentation

## 2019-08-28 MED ORDER — FUROSEMIDE 20 MG PO TABS: 10.0000 mg | ORAL_TABLET | Freq: Every day | ORAL | 0 refills | Status: DC | PRN

## 2019-08-28 MED ORDER — OXYCODONE HCL 10 MG PO TABS: 10.0000 mg | ORAL_TABLET | Freq: Four times a day (QID) | ORAL | 0 refills | Status: DC | PRN

## 2019-08-28 NOTE — Patient Instructions (Addendum)
[place] at [facility] Discharge Instructions  You were seen today by Dr. Delton Coombes. He went over your recent scan and test results. You have lung cancer, called adenocarcinoma it is one of the more common types of lung cancer. Your can't be cured but we can treat it and control it. He discussed treatment options with you and what would happen if you have no treatment at all. Surgery is not an option due to the cancer spreading to the liver. He will send off for another test on the biopsy you have already had. He will schedule you for a MRI of your brain. He will see you back in 2 weeks for follow up.   Thank you for choosing [place] at [facility] to provide your oncology and hematology care.  To afford each patient quality time with our provider, please arrive at least 15 minutes before your scheduled appointment time.   If you have a lab appointment with the [facility] please come in thru the  Main Entrance and check in at the main information desk  You need to re-schedule your appointment should you arrive 10 or more minutes late.  We strive to give you quality time with our providers, and arriving late affects you and other patients whose appointments are after yours.  Also, if you no show three or more times for appointments you may be dismissed from the clinic at the providers discretion.     Again, thank you for choosing [facility].  Our hope is that these requests will decrease the amount of time that you wait before being seen by our physicians.       _____________________________________________________________  Should you have questions after your visit to [facility], please contact our office at [phone] between the hours of 8:00 a.m. and 4:30 p.m.  Voicemails left after 4:00 p.m. will not be returned until the following business day.  For prescription refill requests, have your pharmacy contact our  office and allow 72 hours.    Cancer Center Support Programs:   > Cancer Support Group  2nd Tuesday of the month 1pm-2pm, Journey Room

## 2019-08-28 NOTE — Assessment & Plan Note (Signed)
1.  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 -Liver biopsy on 08/22/2019 consistent with metastatic carcinoma, CK7 and TTF-1 positive.  This is consistent with adenocarcinoma. -We discussed about her new diagnosis in detail.  We also discussed about prognosis of 4 metastatic lung cancer. -We briefly discussed treatment options. -I have recommended MRI of the brain with and without gadolinium to complete staging work-up.  She is claustrophobic.  She will be given Xanax 0.25 mg 1 hour prior to the scan. -We will send PD-L1 and foundation 1 testing.  Patient will come back in 2 weeks to discuss the results and further treatment plan.  2.  Right upper quadrant pain: -Patient reports pain in the right upper quadrant,  right lateral abdomen which is constant. -She is taking oxycodone 5 mg 2 tablets every 6 hours which is helping with the pain. -I will give her oxycodone 10 mg tablet to be taken every 6 hours as needed.  3.  Leg swellings: -Her albumin level is 2.4.  She is a vegetarian.  She was told to drink protein supplements and eat high-protein food. -We will start her on Lasix 10 mg in the mornings as needed.

## 2019-08-28 NOTE — Progress Notes (Signed)
[address] [place], [place] [address]   CLINIC:  Medical Oncology/Hematology  PCP:  Celene Squibb, MD [place] [address] [phone]   REASON FOR VISIT:  Follow-up for metastatic lung cancer.  CURRENT THERAPY: Under work-up.  BRIEF ONCOLOGIC HISTORY:  Oncology History  Adenocarcinoma of lung, stage 4, right ([place])  08/28/2019 Initial Diagnosis   Adenocarcinoma of lung, stage 4, right ([place])   08/28/2019 Cancer Staging   Staging form: Lung, AJCC 8th Edition - Clinical stage from 08/28/2019: Stage IVB (cT2a, cN2, pM1c) - Signed by [name], MD on 08/28/2019      CANCER STAGING: Cancer Staging Adenocarcinoma of lung, stage 4, right ([place]) Staging form: Lung, AJCC 8th Edition - Clinical stage from 08/28/2019: Stage IVB (cT2a, cN2, pM1c) - Signed by [name], MD on 08/28/2019    INTERVAL HISTORY:  Stacey Nash 69 y.o. female seen for follow-up of metastatic lung cancer to the liver.  She was recently discharged from the hospital after biopsy of the liver.  She could not have the MRI done as the MRI machine broke.  She reports appetite and energy levels of 25%.  She reports pain of 9 out of 10 on the right upper quadrant and under the rib cage.  She is taking oxycodone 5 mg 2 tablets every 6 hours which is controlling the pain.  She asked for refills.  She is using oxygen 24/7 at home.  She is accompanied by her son today.    REVIEW OF SYSTEMS:  Review of Systems  Constitutional: Positive for fatigue.  Respiratory: Positive for shortness of breath.   Gastrointestinal: Positive for abdominal pain.  Neurological: Positive for dizziness.  Psychiatric/Behavioral: Positive for sleep disturbance.  All other systems reviewed and are negative.    PAST MEDICAL/SURGICAL HISTORY:  Past Medical History:  Diagnosis Date  . Allergy or intolerance to drug    Doxycycline caused worsening shortness of breath and loose  stools.  . Asthma   . Chronic neck pain   . COPD (chronic obstructive pulmonary disease) ([place])   . Crohn's colitis ([place]) 10/03/2018  . Diverticula of intestine   . Dyspnea   . Hypertension   . Left adrenal mass ([place])   . On home oxygen therapy   . Osteoporosis   . Tobacco abuse    Past Surgical History:  Procedure Laterality Date  . APPENDECTOMY     2013  . BIOPSY  10/01/2017   Procedure: BIOPSY;  Surgeon: Rogene Houston, MD;  [facility];  Service: Endoscopy;;  left and right colon  . CHOLECYSTECTOMY    . COLONOSCOPY WITH PROPOFOL N/A 10/01/2017   Procedure: COLONOSCOPY WITH PROPOFOL;  Surgeon: Rogene Houston, MD;  Location: [facility];  Service: Endoscopy;  Laterality: N/A;  . ESOPHAGOGASTRODUODENOSCOPY N/A 09/09/2014   Procedure: ESOPHAGOGASTRODUODENOSCOPY (EGD);  Surgeon: Rogene Houston, MD;  Location[facility];  Service: Endoscopy;  Laterality: N/A;  210 - moved to 9:10 - Ann to notify pt  . EYE SURGERY     cataract/implants  . LAPAROSCOPIC APPENDECTOMY  07/14/2011   Procedure: APPENDECTOMY LAPAROSCOPIC;  Surgeon: Donato Heinz, MD;  Location: [facility];  Service: General;  Laterality: N/A;  . POLYPECTOMY  10/01/2017   Procedure: POLYPECTOMY;  Surgeon: Rogene Houston, MD;  Location[facility];  Service: Endoscopy;;  rectal     SOCIAL HISTORY:  Social History   Socioeconomic History  . Marital  status: Widowed    Spouse name: Not on file  . Number of children: Not on file  . Years of education: Not on file  . Highest education level: Not on file  Occupational History  . Not on file  Tobacco Use  . Smoking status: Former Smoker    Packs/day: 2.00    Years: 50.00    Pack years: 100.00    Types: Cigarettes    Quit date: 09/29/2013    Years since quitting: 5.9  . Smokeless tobacco: Never Used  . Tobacco comment: quit 4 yrs ago.   Substance and Sexual Activity  . Alcohol use: No    Alcohol/week: 0.0 standard drinks  . Drug use: No  . Sexual  activity: Not Currently  Other Topics Concern  . Not on file  Social History Narrative   Retired from book keeping and taxes.   Single, divorced.   3 boys.    Enjoys puzzles, reading, shopping.   Does not attend church, but watches tv church.   Does not eat red meat. Does not eat eggs or drink dairy.   Drives.   Wear seatbelt.    Youngest son lives with her.    Has grandchildren.    Social Determinants of Health   Financial Resource Strain:   . Difficulty of Paying Living Expenses:   Food Insecurity:   . Worried About Charity fundraiser in the Last Year:   . Arboriculturist in the Last Year:   Transportation Needs:   . Film/video editor (Medical):   Marland Kitchen Lack of Transportation (Non-Medical):   Physical Activity:   . Days of Exercise per Week:   . Minutes of Exercise per Session:   Stress:   . Feeling of Stress :   Social Connections:   . Frequency of Communication with Friends and Family:   . Frequency of Social Gatherings with Friends and Family:   . Attends Religious Services:   . Active Member of Clubs or Organizations:   . Attends Archivist Meetings:   Marland Kitchen Marital Status:   Intimate Partner Violence:   . Fear of Current or Ex-Partner:   . Emotionally Abused:   Marland Kitchen Physically Abused:   . Sexually Abused:     FAMILY HISTORY:  Family History  Problem Relation Age of Onset  . Hypertension Mother   . Thyroid disease Mother   . Diabetes Father   . Alcoholism Father   . Crohn's disease Brother   . Hypertension Son     CURRENT MEDICATIONS:  Outpatient Encounter Medications as of 08/28/2019  Medication Sig Note  . amLODipine (NORVASC) 5 MG tablet Take 1 tablet (5 mg total) by mouth daily.   . Calcium Carb-Cholecalciferol (CALCIUM 600 + D PO) Take 1 tablet by mouth daily.    . Cholecalciferol (VITAMIN D3) 25 MCG (1000 UT) CAPS Take 2,000 Units by mouth daily.    . feeding supplement, ENSURE ENLIVE, (ENSURE ENLIVE) LIQD Take 237 mLs by mouth 2 (two) times  daily between meals.   . Multiple Vitamins-Minerals (CENTRUM SILVER ULTRA WOMENS PO) Take by mouth daily.   . Omega-3 Fatty Acids (FISH OIL) 1000 MG CAPS Take 1,000 mg by mouth 2 (two) times daily.    . OXYGEN Inhale 2 L into the lungs daily.   . propranolol (INDERAL) 10 MG tablet TAKE 1 TABLET BY MOUTH TWICE A DAY (Patient taking differently: Take 5 mg by mouth daily as needed. 1/2 tab at night)   .  tiotropium (SPIRIVA HANDIHALER) 18 MCG inhalation capsule Place 18 mcg into inhaler and inhale every evening.    Marland Kitchen acetaminophen (TYLENOL) 500 MG tablet Take 1,000 mg by mouth daily as needed for mild pain, moderate pain or headache.    . albuterol (PROVENTIL HFA;VENTOLIN HFA) 108 (90 BASE) MCG/ACT inhaler Inhale 2 puffs into the lungs every 6 (six) hours as needed for wheezing or shortness of breath.    Marland Kitchen albuterol (PROVENTIL) (2.5 MG/3ML) 0.083% nebulizer solution Take 2.5 mg by nebulization every 6 (six) hours as needed for wheezing or shortness of breath.   Marland Kitchen aluminum hydroxide-magnesium carbonate (GAVISCON) 95-358 MG/15ML SUSP Take 15 mLs by mouth 3 (three) times daily as needed for indigestion or heartburn.    . fluticasone (FLONASE) 50 MCG/ACT nasal spray Place 1-2 sprays into both nostrils daily as needed.   . furosemide (LASIX) 20 MG tablet Take 0.5 tablets (10 mg total) by mouth daily as needed.   . loperamide (IMODIUM) 2 MG capsule Take 1 capsule (2 mg total) by mouth 4 (four) times daily as needed for diarrhea or loose stools. (Patient not taking: Reported on 08/28/2019)   . Menthol, Topical Analgesic, (ICY HOT EX) Apply 1 application topically daily as needed (pain).   Marland Kitchen oxyCODONE 10 MG TABS Take 1 tablet (10 mg total) by mouth every 6 (six) hours as needed for severe pain.   . polyethylene glycol (MIRALAX / GLYCOLAX) 17 g packet Take 17 g by mouth daily. (Patient not taking: Reported on 08/28/2019) 08/22/2019: Pt has not used   . promethazine (PHENERGAN) 25 MG tablet Take 12.5-25 mg by mouth  every 6 (six) hours as needed. 08/22/2019: Pt states has not used  . senna (SENOKOT) 8.6 MG TABS tablet Take 1 tablet (8.6 mg total) by mouth at bedtime as needed for mild constipation. (Patient not taking: Reported on 08/28/2019) 08/22/2019: Pt states has not used   . [DISCONTINUED] oxyCODONE (OXY IR/ROXICODONE) 5 MG immediate release tablet Take 1 tablet (5 mg total) by mouth every 6 (six) hours as needed. (Patient not taking: Reported on 08/28/2019)    No facility-administered encounter medications on file as of 08/28/2019.    ALLERGIES:  Allergies  Allergen Reactions  . Sulfa Antibiotics Shortness Of Breath  . Tetanus Toxoids Rash  . Doxycycline Diarrhea  . Statins     Leg pain and weakness  . Zofran [Ondansetron Hcl] Hives and Rash    Rash at injection site that spread immediately following administration       PHYSICAL EXAM:  ECOG Performance status: 1  Vitals:   08/28/19 1402  BP: 138/60  Pulse: 91  Resp: 18  Temp: (!) 97.3 F (36.3 C)  SpO2: 98%   Filed Weights   08/28/19 1402  Weight: 112 lb (50.8 kg)    Physical Exam Vitals reviewed.  Constitutional:      Appearance: Normal appearance.  HENT:     Head: Normocephalic and atraumatic.  Eyes:     Conjunctiva/sclera: Conjunctivae normal.  Skin:    General: Skin is warm.  Neurological:     General: No focal deficit present.     Mental Status: She is alert and oriented to person, place, and time.  Psychiatric:        Mood and Affect: Mood normal.        Behavior: Behavior normal.      LABORATORY DATA:  I have reviewed the labs as listed.  CBC    Component Value Date/Time   WBC 14.7 (  H) 08/22/2019 1150   RBC 3.99 08/22/2019 1150   HGB 11.0 (L) 08/22/2019 1150   HCT 35.3 (L) 08/22/2019 1150   PLT 568 (H) 08/22/2019 1150   MCV 88.5 08/22/2019 1150   MCH 27.6 08/22/2019 1150   MCHC 31.2 08/22/2019 1150   RDW 14.0 08/22/2019 1150   LYMPHSABS 1.0 08/19/2019 1340   MONOABS 1.4 (H) 08/19/2019 1340   EOSABS  0.0 08/19/2019 1340   BASOSABS 0.1 08/19/2019 1340   CMP Latest Ref Rng & Units 08/21/2019 08/20/2019 08/19/2019  Glucose 70 - 99 mg/dL 112(H) 101(H) 136(H)  BUN 8 - 23 mg/dL 11 7(L) 9  Creatinine 0.44 - 1.00 mg/dL 0.49 0.48 0.55  Sodium 135 - 145 mmol/L 137 137 134(L)  Potassium 3.5 - 5.1 mmol/L 3.8 3.7 3.7  Chloride 98 - 111 mmol/L 102 103 101  CO2 22 - 32 mmol/L 24 22 22   Calcium 8.9 - 10.3 mg/dL 8.2(L) 8.0(L) 8.3(L)  Total Protein 6.5 - 8.1 g/dL 5.7(L) 5.7(L) 6.6  Total Bilirubin 0.3 - 1.2 mg/dL 0.7 0.9 1.2  Alkaline Phos 38 - 126 U/L 270(H) 288(H) 361(H)  AST 15 - 41 U/L 34 44(H) 60(H)  ALT 0 - 44 U/L 21 24 30        DIAGNOSTIC IMAGING:  I have independently reviewed the scans and discussed with the patient.     ASSESSMENT & PLAN:   Adenocarcinoma of lung, stage 4, right ([place]) 1.  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 -Liver biopsy on 08/22/2019 consistent with metastatic carcinoma, CK7 and TTF-1 positive.  This is consistent with adenocarcinoma. -We discussed about her new diagnosis in detail.  We also discussed about prognosis of 4 metastatic lung cancer. -We briefly discussed treatment options. -I have recommended MRI of the brain with and without gadolinium to complete staging work-up.  She is claustrophobic.  She will be given Xanax 0.25 mg 1 hour prior to the scan. -We will send PD-L1 and foundation 1 testing.  Patient will come back in 2 weeks to discuss the results and further treatment plan.  2.  Right upper quadrant pain: -Patient reports pain in the right upper quadrant,  right lateral abdomen which is constant. -She is taking oxycodone 5 mg 2 tablets every 6 hours which is helping with the pain. -I will give her oxycodone 10 mg tablet to be taken every 6 hours as needed.  3.  Leg swellings: -Her albumin level is 2.4.   She is a vegetarian.  She was told to drink protein supplements and eat high-protein food. -We will start her on Lasix 10 mg in the mornings as needed.      Orders placed this encounter:  Orders Placed This Encounter  Procedures  . MR Brain W Wo Contrast   Total time spent is 30 minutes with more than 50% of the time spent face-to-face discussing and reviewing scan results, diagnosis, prognosis, counseling and coordination of care.   [name], MD [place] [phone]

## 2019-09-02 ENCOUNTER — Encounter (HOSPITAL_COMMUNITY): Payer: Self-pay | Admitting: *Deleted

## 2019-09-02 NOTE — Progress Notes (Signed)
I emailed pathology and ordered foundation one and PDL1 on accession # [id number].  C34.91, Stage IV.

## 2019-09-03 DIAGNOSIS — R600 Localized edema: Secondary | ICD-10-CM | POA: Diagnosis not present

## 2019-09-03 DIAGNOSIS — R1010 Upper abdominal pain, unspecified: Secondary | ICD-10-CM | POA: Diagnosis not present

## 2019-09-03 DIAGNOSIS — E43 Unspecified severe protein-calorie malnutrition: Secondary | ICD-10-CM | POA: Diagnosis not present

## 2019-09-03 DIAGNOSIS — L299 Pruritus, unspecified: Secondary | ICD-10-CM | POA: Diagnosis not present

## 2019-09-03 DIAGNOSIS — C3491 Malignant neoplasm of unspecified part of right bronchus or lung: Secondary | ICD-10-CM | POA: Diagnosis not present

## 2019-09-08 ENCOUNTER — Other Ambulatory Visit (HOSPITAL_COMMUNITY): Payer: Self-pay | Admitting: *Deleted

## 2019-09-08 MED ORDER — ALPRAZOLAM 0.25 MG PO TABS: ORAL_TABLET | ORAL | 0 refills | Status: DC

## 2019-09-08 NOTE — Telephone Encounter (Signed)
Patient's daughter called requesting something to help her mom with anxiety during the MRI for this week.  Instructions given on Xanax, to take one tablet one hour before exam, if she is still anxious, she can take another 30 minutes before exam.  She verbalizes understanding of instructions.

## 2019-09-10 ENCOUNTER — Ambulatory Visit (HOSPITAL_COMMUNITY)
Admission: RE | Admit: 2019-09-10 | Discharge: 2019-09-10 | Disposition: A | Discharge status: Home / Self Care | Payer: Medicare Other | Source: Ambulatory Visit | Attending: Hematology | Admitting: Hematology

## 2019-09-10 ENCOUNTER — Encounter (HOSPITAL_COMMUNITY): Payer: Self-pay | Admitting: Hematology

## 2019-09-10 DIAGNOSIS — C3490 Malignant neoplasm of unspecified part of unspecified bronchus or lung: Secondary | ICD-10-CM | POA: Diagnosis not present

## 2019-09-10 DIAGNOSIS — C3411 Malignant neoplasm of upper lobe, right bronchus or lung: Secondary | ICD-10-CM | POA: Diagnosis not present

## 2019-09-10 DIAGNOSIS — R9082 White matter disease, unspecified: Secondary | ICD-10-CM | POA: Diagnosis not present

## 2019-09-10 MED ORDER — GADOBUTROL 1 MMOL/ML IV SOLN
5.0000 mL | Freq: Once | INTRAVENOUS | Status: AC | PRN
  Administered 2019-09-10: 5 mL via INTRAVENOUS

## 2019-09-11 DIAGNOSIS — C3491 Malignant neoplasm of unspecified part of right bronchus or lung: Secondary | ICD-10-CM | POA: Diagnosis not present

## 2019-09-15 ENCOUNTER — Other Ambulatory Visit (HOSPITAL_COMMUNITY): Payer: Self-pay | Admitting: *Deleted

## 2019-09-15 ENCOUNTER — Encounter (HOSPITAL_COMMUNITY): Payer: Self-pay | Admitting: Hematology

## 2019-09-15 ENCOUNTER — Other Ambulatory Visit: Payer: Self-pay

## 2019-09-15 ENCOUNTER — Inpatient Hospital Stay (HOSPITAL_BASED_OUTPATIENT_CLINIC_OR_DEPARTMENT_OTHER): Payer: Medicare Other | Admitting: Hematology

## 2019-09-15 DIAGNOSIS — C787 Secondary malignant neoplasm of liver and intrahepatic bile duct: Secondary | ICD-10-CM | POA: Diagnosis not present

## 2019-09-15 DIAGNOSIS — C3491 Malignant neoplasm of unspecified part of right bronchus or lung: Secondary | ICD-10-CM

## 2019-09-15 DIAGNOSIS — M7989 Other specified soft tissue disorders: Secondary | ICD-10-CM | POA: Diagnosis not present

## 2019-09-15 DIAGNOSIS — R1011 Right upper quadrant pain: Secondary | ICD-10-CM | POA: Diagnosis not present

## 2019-09-15 DIAGNOSIS — Z7189 Other specified counseling: Secondary | ICD-10-CM

## 2019-09-15 DIAGNOSIS — C3411 Malignant neoplasm of upper lobe, right bronchus or lung: Secondary | ICD-10-CM | POA: Diagnosis not present

## 2019-09-15 DIAGNOSIS — K5090 Crohn's disease, unspecified, without complications: Secondary | ICD-10-CM | POA: Diagnosis not present

## 2019-09-15 DIAGNOSIS — J449 Chronic obstructive pulmonary disease, unspecified: Secondary | ICD-10-CM | POA: Diagnosis not present

## 2019-09-15 MED ORDER — ROLLER WALKER MISC: 0 refills | Status: DC

## 2019-09-15 MED ORDER — MECLIZINE HCL 25 MG PO TABS: 25.0000 mg | ORAL_TABLET | Freq: Three times a day (TID) | ORAL | 0 refills | Status: AC | PRN

## 2019-09-15 MED ORDER — OXYCODONE HCL 10 MG PO TABS: 10.0000 mg | ORAL_TABLET | Freq: Four times a day (QID) | ORAL | 0 refills | Status: DC | PRN

## 2019-09-15 NOTE — Progress Notes (Signed)
[name], [name] [address]   CLINIC:  Medical Oncology/Hematology  PCP:  Celene Squibb, MD [name] [address] [phone]   REASON FOR VISIT:  Metastatic lung cancer  CURRENT THERAPY: [name]  BRIEF ONCOLOGIC HISTORY:  Oncology History  Adenocarcinoma of lung, stage 4, right ([name])  08/28/2019 Initial Diagnosis   Adenocarcinoma of lung, stage 4, right ([name])   08/28/2019 Cancer Staging   Staging form: Lung, AJCC 8th Edition - Clinical stage from 08/28/2019: Stage IVB (cT2a, cN2, pM1c) - Signed by [name], MD on 08/28/2019   09/08/2019 Genetic Testing   PD-L1 IHC Analysis     09/22/2019 -  Chemotherapy   The patient had pembrolizumab (KEYTRUDA) 200 mg in sodium chloride 0.9 % 50 mL chemo infusion, 200 mg, Intravenous, Once, 0 of 6 cycles  for chemotherapy treatment.       CANCER STAGING: Cancer Staging Adenocarcinoma of lung, stage 4, right [facility]) Staging form: Lung, AJCC 8th Edition - Clinical stage from 08/28/2019: Stage IVB (cT2a, cN2, pM1c) - Signed by [name], MD on 08/28/2019    INTERVAL HISTORY:  [name] 69 y.o. female seen for follow-up of metastatic lung cancer.  Reports appetite of 25% and energy levels of low.  Reports pain in the right flank region as 6 out of 10.  She is currently taking OxyContin 10 mg twice daily.  She is requiring oxycodone 10 mg as breakthrough in between once daily.  Reports shortness of breath on exertion which is stable.  Occasional dizziness present.  Continues to have leg swellings.  She is taking Lasix half tablet daily which is not helping.   REVIEW OF SYSTEMS:  Review of Systems  Respiratory: Positive for shortness of breath.   Cardiovascular: Positive for chest pain.  Neurological: Positive for dizziness.  Psychiatric/Behavioral: Positive for sleep disturbance.  All other systems reviewed and are negative.    PAST MEDICAL/SURGICAL HISTORY:    Past Medical History:  Diagnosis Date  . Allergy or intolerance to drug    Doxycycline caused worsening shortness of breath and loose stools.  . Asthma   . Chronic neck pain   . COPD (chronic obstructive pulmonary disease) ([place])   . Crohn's colitis ([place]) 10/03/2018  . Diverticula of intestine   . Dyspnea   . Hypertension   . Left adrenal mass ([name])   . On home oxygen therapy   . Osteoporosis   . Tobacco abuse    Past Surgical History:  Procedure Laterality Date  . APPENDECTOMY     2013  . BIOPSY  10/01/2017   Procedure: BIOPSY;  Surgeon: Rogene Houston, MD;  Location: [facility];  Service: Endoscopy;;  left and right colon  . CHOLECYSTECTOMY    . COLONOSCOPY WITH PROPOFOL N/A 10/01/2017   Procedure: COLONOSCOPY WITH PROPOFOL;  Surgeon: Rogene Houston, MD;  Location: [facility];  Service: Endoscopy;  Laterality: N/A;  . ESOPHAGOGASTRODUODENOSCOPY N/A 09/09/2014   Procedure: ESOPHAGOGASTRODUODENOSCOPY (EGD);  Surgeon: Rogene Houston, MD;  Location: [facility];  Service: Endoscopy;  Laterality: N/A;  210 - moved to 9:10 - Ann to notify pt  . EYE SURGERY     cataract/implants  . LAPAROSCOPIC APPENDECTOMY  07/14/2011   Procedure: APPENDECTOMY LAPAROSCOPIC;  Surgeon: Donato Heinz, MD;  Location: [facility] ORS;  Service: General;  Laterality: N/A;  . POLYPECTOMY  10/01/2017   Procedure: POLYPECTOMY;  Surgeon: Rogene Houston, MD;  Location:  [facility];  Service: Endoscopy;;  rectal     SOCIAL HISTORY:  Social History   Socioeconomic History  . Marital status: Widowed    Spouse name: Not on file  . Number of children: Not on file  . Years of education: Not on file  . Highest education level: Not on file  Occupational History  . Not on file  Tobacco Use  . Smoking status: Former Smoker    Packs/day: 2.00    Years: 50.00    Pack years: 100.00    Types: Cigarettes    Quit date: 09/29/2013    Years since quitting: 5.9  . Smokeless tobacco: Never Used  . Tobacco  comment: quit 4 yrs ago.   Substance and Sexual Activity  . Alcohol use: No    Alcohol/week: 0.0 standard drinks  . Drug use: No  . Sexual activity: Not Currently  Other Topics Concern  . Not on file  Social History Narrative   Retired from book keeping and taxes.   Single, divorced.   3 boys.    Enjoys puzzles, reading, shopping.   Does not attend church, but watches tv church.   Does not eat red meat. Does not eat eggs or drink dairy.   Drives.   Wear seatbelt.    Youngest son lives with her.    Has grandchildren.    Social Determinants of Health   Financial Resource Strain:   . Difficulty of Paying Living Expenses:   Food Insecurity:   . Worried About Charity fundraiser in the Last Year:   . Arboriculturist in the Last Year:   Transportation Needs:   . Film/video editor (Medical):   Marland Kitchen Lack of Transportation (Non-Medical):   Physical Activity:   . Days of Exercise per Week:   . Minutes of Exercise per Session:   Stress:   . Feeling of Stress :   Social Connections:   . Frequency of Communication with Friends and Family:   . Frequency of Social Gatherings with Friends and Family:   . Attends Religious Services:   . Active Member of Clubs or Organizations:   . Attends Archivist Meetings:   Marland Kitchen Marital Status:   Intimate Partner Violence:   . Fear of Current or Ex-Partner:   . Emotionally Abused:   Marland Kitchen Physically Abused:   . Sexually Abused:     FAMILY HISTORY:  Family History  Problem Relation Age of Onset  . Hypertension Mother   . Thyroid disease Mother   . Diabetes Father   . Alcoholism Father   . Crohn's disease Brother   . Hypertension Son     CURRENT MEDICATIONS:  Outpatient Encounter Medications as of 09/15/2019  Medication Sig Note  . feeding supplement, ENSURE ENLIVE, (ENSURE ENLIVE) LIQD Take 237 mLs by mouth 2 (two) times daily between meals.   . Omega-3 Fatty Acids (FISH OIL) 1000 MG CAPS Take 1,000 mg by mouth 2 (two) times  daily.    Marland Kitchen oxyCODONE 10 MG TABS Take 1 tablet (10 mg total) by mouth every 6 (six) hours as needed for severe pain.   . OXYGEN Inhale 2 L into the lungs daily.   . propranolol (INDERAL) 10 MG tablet TAKE 1 TABLET BY MOUTH TWICE A DAY   . tiotropium (SPIRIVA HANDIHALER) 18 MCG inhalation capsule Place 18 mcg into inhaler and inhale every evening.    Marland Kitchen acetaminophen (TYLENOL) 500 MG tablet Take 1,000 mg by mouth daily  as needed for mild pain, moderate pain or headache.    . albuterol (PROVENTIL HFA;VENTOLIN HFA) 108 (90 BASE) MCG/ACT inhaler Inhale 2 puffs into the lungs every 6 (six) hours as needed for wheezing or shortness of breath.    Marland Kitchen albuterol (PROVENTIL) (2.5 MG/3ML) 0.083% nebulizer solution Take 2.5 mg by nebulization every 6 (six) hours as needed for wheezing or shortness of breath.   Marland Kitchen aluminum hydroxide-magnesium carbonate (GAVISCON) 95-358 MG/15ML SUSP Take 15 mLs by mouth 3 (three) times daily as needed for indigestion or heartburn.    Marland Kitchen amLODipine (NORVASC) 5 MG tablet Take 1 tablet (5 mg total) by mouth daily. (Patient not taking: Reported on 09/15/2019)   . Calcium Carb-Cholecalciferol (CALCIUM 600 + D PO) Take 1 tablet by mouth daily.    . Cholecalciferol (VITAMIN D3) 25 MCG (1000 UT) CAPS Take 2,000 Units by mouth daily.    . fluticasone (FLONASE) 50 MCG/ACT nasal spray Place 1-2 sprays into both nostrils daily as needed.   . furosemide (LASIX) 20 MG tablet Take 0.5 tablets (10 mg total) by mouth daily as needed. (Patient not taking: Reported on 09/15/2019)   . loperamide (IMODIUM) 2 MG capsule Take 1 capsule (2 mg total) by mouth 4 (four) times daily as needed for diarrhea or loose stools. (Patient not taking: Reported on 08/28/2019)   . meclizine (ANTIVERT) 25 MG tablet Take 1 tablet (25 mg total) by mouth 3 (three) times daily as needed for dizziness.   . Menthol, Topical Analgesic, (ICY HOT EX) Apply 1 application topically daily as needed (pain).   . Misc. Devices (ROLLER  WALKER) MISC Pt requires a roll-aider walker   . Multiple Vitamins-Minerals (CENTRUM SILVER ULTRA WOMENS PO) Take by mouth daily.   . polyethylene glycol (MIRALAX / GLYCOLAX) 17 g packet Take 17 g by mouth daily. (Patient not taking: Reported on 08/28/2019) 08/22/2019: Pt has not used   . promethazine (PHENERGAN) 25 MG tablet Take 12.5-25 mg by mouth every 6 (six) hours as needed. 08/22/2019: Pt states has not used  . senna (SENOKOT) 8.6 MG TABS tablet Take 1 tablet (8.6 mg total) by mouth at bedtime as needed for mild constipation. (Patient not taking: Reported on 08/28/2019) 08/22/2019: Pt states has not used   . [DISCONTINUED] ALPRAZolam (XANAX) 0.25 MG tablet Take 1 tablet one hour before MRI, if still anxious, may take 1 tablet 30 minutes before MRI.    No facility-administered encounter medications on file as of 09/15/2019.    ALLERGIES:  Allergies  Allergen Reactions  . Sulfa Antibiotics Shortness Of Breath  . Tetanus Toxoids Rash  . Doxycycline Diarrhea  . Statins     Leg pain and weakness  . Zofran [Ondansetron Hcl] Hives and Rash    Rash at injection site that spread immediately following administration       PHYSICAL EXAM:  ECOG Performance status: 1  Vitals:   09/15/19 0838  BP: (!) 113/52  Pulse: (!) 112  Resp: 19  Temp: (!) 97.1 F (36.2 C)  SpO2: 97%   Filed Weights   09/15/19 0838  Weight: 109 lb 12.8 oz (49.8 kg)    Physical Exam Vitals reviewed.  Constitutional:      Appearance: Normal appearance.  HENT:     Head: Normocephalic and atraumatic.  Eyes:     Conjunctiva/sclera: Conjunctivae normal.  Skin:    General: Skin is warm.  Neurological:     General: No focal deficit present.     Mental Status: She is  alert and oriented to person, place, and time.  Psychiatric:        Mood and Affect: Mood normal.        Behavior: Behavior normal.      LABORATORY DATA:  I have reviewed the labs as listed.  CBC    Component Value Date/Time   WBC 14.7 (H)  08/22/2019 1150   RBC 3.99 08/22/2019 1150   HGB 11.0 (L) 08/22/2019 1150   HCT 35.3 (L) 08/22/2019 1150   PLT 568 (H) 08/22/2019 1150   MCV 88.5 08/22/2019 1150   MCH 27.6 08/22/2019 1150   MCHC 31.2 08/22/2019 1150   RDW 14.0 08/22/2019 1150   LYMPHSABS 1.0 08/19/2019 1340   MONOABS 1.4 (H) 08/19/2019 1340   EOSABS 0.0 08/19/2019 1340   BASOSABS 0.1 08/19/2019 1340   CMP Latest Ref Rng & Units 08/21/2019 08/20/2019 08/19/2019  Glucose 70 - 99 mg/dL 112(H) 101(H) 136(H)  BUN 8 - 23 mg/dL 11 7(L) 9  Creatinine 0.44 - 1.00 mg/dL 0.49 0.48 0.55  Sodium 135 - 145 mmol/L 137 137 134(L)  Potassium 3.5 - 5.1 mmol/L 3.8 3.7 3.7  Chloride 98 - 111 mmol/L 102 103 101  CO2 22 - 32 mmol/L 24 22 22   Calcium 8.9 - 10.3 mg/dL 8.2(L) 8.0(L) 8.3(L)  Total Protein 6.5 - 8.1 g/dL 5.7(L) 5.7(L) 6.6  Total Bilirubin 0.3 - 1.2 mg/dL 0.7 0.9 1.2  Alkaline Phos 38 - 126 U/L 270(H) 288(H) 361(H)  AST 15 - 41 U/L 34 44(H) 60(H)  ALT 0 - 44 U/L 21 24 30        DIAGNOSTIC IMAGING:  I have reviewed scans and discussed with the patient.     ASSESSMENT & PLAN:   Goals of care, counseling/discussion Treatment stage IV lung cancer in the palliative setting was discussed.  Adenocarcinoma of lung, stage 4, right ([place]) 1.  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 -Liver biopsy on 08/22/2019 consistent with metastatic carcinoma, CK7 and TTF-1 positive consistent with adenocarcinoma. -MRI of the brain on 09/10/2019 was negative for metastatic disease. -PD-L1 TPS-80%.  Foundation 1 with no targetable mutations. -Based on performance status, I have recommended single agent Keytruda at this time even though she has significant metastatic disease in the liver. -We talked about the side effects including rare chance of colitis, pneumonitis, hypophysitis, dermatitis.  She does not  have any autoimmune problems. -We will likely start treatment next week.  We will consider port placement if difficulty with venous access.  2.  Right upper quadrant pain: -I have increased oxycodone to 10 mg tablets every 6 hours which did not help. -Her primary care doctor started her on OxyContin 10 mg twice daily.  She is taking oxycodone 10 mg 1 tablet in between which is helping. -We will continue current regimen.  3.  Difficulty ambulation: -He has difficulty ambulation from leg swellings and lung cancer.  We will prescribe Rollator.  4.  Leg swellings: -Albumin level is low at 2.4.  She is a vegetarian. -She was encouraged to increase protein intake. -She is taking Lasix 20 mg half tablet daily.  This is not helping much.  I will increase it to 20 mg daily.      Orders placed this encounter:  No orders of the defined types were placed in this encounter.  Total time spent is 40 minutes with more than 50% of the time  spent face-to-face discussing scan results, treatment plan, side effects, counseling and coordination of care.   [name], MD [place] [phone]

## 2019-09-15 NOTE — Assessment & Plan Note (Signed)
Treatment stage IV lung cancer in the palliative setting was discussed.

## 2019-09-15 NOTE — Patient Instructions (Addendum)
[place] at [facility] Discharge Instructions  You were seen today by Dr. Delton Coombes. He went over your recent lab results. Your special testing showed that you could benefit from immunotherapy, Keytruda, which is not chemotherapy and does not have the same side effects. This treatment will not cure your cancer but it will help control your cancer. Start taking the Lasix 47m daily. He will send in a new prescription for Meclizine to help with the inner ear. He will also send in a prescription for a walker. He will refill your pain medication. Try to increase your protein to help with the swelling in your feet.  He will see you back in 1 week for labs, treatment and follow up.   [facility].  To afford each patient quality time with our provider, please arrive at least 15 minutes before your scheduled appointment time.   If you have a lab appointment with the [facility] come in thru the  Main Entrance and check in at the main information desk  You need to re-schedule your appointment should you arrive 10 or more minutes late.  We strive to give you quality time with our providers, and arriving late affects you and other patients whose appointments are after yours.  Also, if you no show three or more times for appointments you may be dismissed from the clinic at the providers discretion.     Again, [facility]  Our hope is that these requests will decrease the amount of time that you wait before being seen by our physicians.       _____________________________________________________________  Should you have questions [facility] please contact our office at [phone] between the hours of 8:00 a.m. and 4:30 p.m.  Voicemails left after 4:00 p.m. will not be returned until the following  business day.  For prescription refill requests, have your pharmacy contact our office and allow 72 hours.    Cancer Center Support Programs:   > Cancer Support Group  2nd Tuesday of the month 1pm-2pm, Journey Room

## 2019-09-15 NOTE — Progress Notes (Signed)
START ON PATHWAY REGIMEN - Non-Small Cell Lung     A cycle is 21 days:     Pembrolizumab   **Always confirm dose/schedule in your pharmacy ordering system**  Patient Characteristics: Stage IV Metastatic, Nonsquamous, Initial Chemotherapy/Immunotherapy, PS = 2, ALK Rearrangement Negative and EGFR Mutation Negative/Non-Sensitizing and ROS1 Rearrangement Negative and NTRK Gene Fusion-Negative and RET Gene Fusion-Negative, PD-L1  Expression Positive ? 50% (TPS) Therapeutic Status: Stage IV Metastatic Histology: Nonsquamous Cell ROS1 Rearrangement Status: Negative Other Mutations/Biomarkers: No Other Actionable Mutations NTRK Gene Fusion Status: Negative PD-L1 Expression Status: PD-L1 Positive ? 50% (TPS) Chemotherapy/Immunotherapy LOT: Initial Chemotherapy/Immunotherapy Molecular Targeted Therapy: Not Appropriate MET Exon 14 Mutation Status: Negative RET Gene Fusion Status: Negative ALK Rearrangement Status: Negative EGFR Mutation Status: Negative/Wild Type BRAF [id number] Mutation Status: Negative ECOG Performance Status: 2 Biomarker Assessment Status Confirmation: All Genomic Markers Negative or Only MET+ or BRAF+ Intent of Therapy: Non-Curative / Palliative Intent, Discussed with Patient

## 2019-09-15 NOTE — Assessment & Plan Note (Signed)
1.  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 -Liver biopsy on 08/22/2019 consistent with metastatic carcinoma, CK7 and TTF-1 positive consistent with adenocarcinoma. -MRI of the brain on 09/10/2019 was negative for metastatic disease. -PD-L1 TPS-80%.  Foundation 1 with no targetable mutations. -Based on performance status, I have recommended single agent Keytruda at this time even though she has significant metastatic disease in the liver. -We talked about the side effects including rare chance of colitis, pneumonitis, hypophysitis, dermatitis.  She does not have any autoimmune problems. -We will likely start treatment next week.  We will consider port placement if difficulty with venous access.  2.  Right upper quadrant pain: -I have increased oxycodone to 10 mg tablets every 6 hours which did not help. -Her primary care doctor started her on OxyContin 10 mg twice daily.  She is taking oxycodone 10 mg 1 tablet in between which is helping. -We will continue current regimen.  3.  Difficulty ambulation: -He has difficulty ambulation from leg swellings and lung cancer.  We will prescribe Rollator.  4.  Leg swellings: -Albumin level is low at 2.4.  She is a vegetarian. -She was encouraged to increase protein intake. -She is taking Lasix 20 mg half tablet daily.  This is not helping much.  I will increase it to 20 mg daily.

## 2019-09-15 NOTE — Progress Notes (Signed)
I met with patient and daughter in law during the visit today. I provided my information to her daughter in law, should she have any questions following the visit.  I reviewed the information provided by Dr. Delton Coombes.  I talked with them about Keytruda and its side effects.  I provided written education on Keytruda and explained that she will get a teaching class just prior to her first treatment.  She was given the opportunity to ask questions and all were answered to her satisfaction.    Patient has been seeing her PCP for pain medication. Dr. Delton Coombes asked that I call Dr. Josue Hector office and advise them that we will assume pain management from this point forward.  I have called and left his nurse a detailed message regarding this.  I asked that they return our call should they have any questions.

## 2019-09-16 ENCOUNTER — Other Ambulatory Visit (HOSPITAL_COMMUNITY): Payer: Self-pay | Admitting: Emergency Medicine

## 2019-09-16 ENCOUNTER — Inpatient Hospital Stay (HOSPITAL_COMMUNITY): Payer: Medicare Other | Admitting: General Practice

## 2019-09-16 ENCOUNTER — Telehealth (HOSPITAL_COMMUNITY): Payer: Self-pay | Admitting: General Practice

## 2019-09-16 DIAGNOSIS — C3491 Malignant neoplasm of unspecified part of right bronchus or lung: Secondary | ICD-10-CM

## 2019-09-16 MED ORDER — MISC. DEVICES MISC: 0 refills | Status: AC

## 2019-09-16 NOTE — Telephone Encounter (Signed)
[place] CSW Progress Notes  Call to patient for scheduled social work visit.  She reports "today is not a good day, can we reschedule."  States that she did not sleep last night, is very fatigued today.  Has family with her.  Rescheduled appt for two weeks in future.  Edwyna Shell, LCSW Clinical Social Worker Phone:  [phone] Cell:  [phone]

## 2019-09-17 NOTE — Progress Notes (Signed)
.   Pharmacist Chemotherapy Monitoring - Initial Assessment    Anticipated start date: 09/23/19   Regimen:  . Are orders appropriate based on the patient's diagnosis, regimen, and cycle? No . Does the plan date match the patient's scheduled date? Yes . Is the sequencing of drugs appropriate? Yes . Are the premedications appropriate for the patient's regimen? Yes . Prior Authorization for treatment is: Approved o If applicable, is the correct biosimilar selected based on the patient's insurance? not applicable  Organ Function and Labs: Marland Kitchen Are dose adjustments needed based on the patient's renal function, hepatic function, or hematologic function? No . Are appropriate labs ordered prior to the start of patient's treatment? Yes . Other organ system assessment, if indicated: N/A . The following baseline labs, if indicated, have been ordered: pembrolizumab: baseline TSH +/- T4  Dose Assessment: . Are the drug doses appropriate? Yes . Are the following correct: o Drug concentrations Yes o IV fluid compatible with drug Yes o Administration routes Yes o Timing of therapy Yes . If applicable, does the patient have documented access for treatment and/or plans for port-a-cath placement? No . If applicable, have lifetime cumulative doses been properly documented and assessed? not applicable Lifetime Dose Tracking  No doses have been documented on this patient for the following tracked chemicals: Doxorubicin, Epirubicin, Idarubicin, Daunorubicin, Mitoxantrone, Bleomycin, Oxaliplatin, Carboplatin, Liposomal Doxorubicin  o   Toxicity Monitoring/Prevention: . The patient has the following take home antiemetics prescribed: N/A . The patient has the following take home medications prescribed: N/A . Medication allergies and previous infusion related reactions, if applicable, have been reviewed and addressed. Yes . The patient's current medication list has been assessed for drug-drug interactions with  their chemotherapy regimen. no significant drug-drug interactions were identified on review.  Order Review: . Are the treatment plan orders signed? No . Is the patient scheduled to see a provider prior to their treatment? Yes  I verify that I have reviewed each item in the above checklist and answered each question accordingly.  Wynona Neat 09/17/2019 3:44 PM

## 2019-09-18 ENCOUNTER — Inpatient Hospital Stay (HOSPITAL_COMMUNITY): Payer: Medicare Other

## 2019-09-18 ENCOUNTER — Other Ambulatory Visit (HOSPITAL_COMMUNITY): Payer: Self-pay | Admitting: *Deleted

## 2019-09-18 ENCOUNTER — Encounter (HOSPITAL_COMMUNITY): Payer: Self-pay | Admitting: *Deleted

## 2019-09-18 DIAGNOSIS — C3411 Malignant neoplasm of upper lobe, right bronchus or lung: Secondary | ICD-10-CM

## 2019-09-18 MED ORDER — ROLLER WALKER MISC: 0 refills | Status: AC

## 2019-09-18 NOTE — Progress Notes (Signed)
Patient's prescription for rollaider was faxed to [facility] health care.  They will contact patient.

## 2019-09-19 ENCOUNTER — Other Ambulatory Visit (HOSPITAL_COMMUNITY): Payer: Self-pay | Admitting: Hematology

## 2019-09-19 ENCOUNTER — Ambulatory Visit (HOSPITAL_COMMUNITY): Payer: Medicare Other

## 2019-09-19 DIAGNOSIS — C3491 Malignant neoplasm of unspecified part of right bronchus or lung: Secondary | ICD-10-CM

## 2019-09-19 NOTE — Progress Notes (Signed)
Nutrition Assessment:  Referral for weight loss, vegetarian.  69 year old female with metastatic lung cancer.  Patient receiving [place].    Spoke with patient via phone for nutrition assessment.  Patient reports that she has no appetite.  Patient has only had applesauce so far today (up at 5am -12:15).  Patient reports that on days when she eats fairly good the next day her stomach want feel good (nauseated, unsettled).  Does not eat meat of any kind or eggs. Will eat cheese, milk, yogurt, dairy products, peanut butter and nuts, beans.  Drinks 1 ensure enlive daily.  Reports yesterday did not eat much. Had one episode of diarrhea and felt nauseated.  Took phenergan which helped a little bit.  LIkes macaroni and cheese, pizza, cheese and tomato sandwiches.  Drinks water, gatorade, gingerale and coke.     Medications: calcium vit D, MVI, imodium, miralax, senna  Labs: reviewed  Anthropometrics:   Height: 62 inches Weight: 109 lb 12.8 oz UBW: 113 lb on 08/19/19 BMI: 20  4% weight loss in the last month, concerning   Estimated Energy Needs  Kcals: 1500-1750 Protein: 75-88 g Fluid: > 1.5 L  NUTRITION DIAGNOSIS: Inadequate oral intake related to cancer related treatment side effects as evidenced by 4% weight loss in 1 month and poor appetite   INTERVENTION:  Encouraged patient to "nibble" q 2 hours.  Must set a reminder to eat.  Discussed strategies to help with nausea.  Handout given for patient to pick up.  Encouraged 1-2 ensure enlive per day. Will provide case for patient to pick up on 4/27 at next appointment Recommend checking Vit B 12 level with patient consuming no meat.   Encouraged patient to continue supplements Provided patient with contact information.      MONITORING, EVALUATION, GOAL: Patient will consume adequate calories and protein to prevent further weight loss   NEXT VISIT: May 14th, phone f/u   Raju Coppolino B. Zenia Resides, [place], [place] Registered Dietitian [phone]  (pager)

## 2019-09-23 ENCOUNTER — Emergency Department (HOSPITAL_COMMUNITY): Payer: Medicare Other

## 2019-09-23 ENCOUNTER — Other Ambulatory Visit (HOSPITAL_COMMUNITY)
Admission: RE | Admit: 2019-09-23 | Discharge: 2019-09-23 | Disposition: A | Discharge status: Home / Self Care | Payer: Medicare Other | Source: Ambulatory Visit | Attending: "Endocrinology | Admitting: "Endocrinology

## 2019-09-23 ENCOUNTER — Inpatient Hospital Stay (HOSPITAL_COMMUNITY)
Admission: EM | Admit: 2019-09-23 | Discharge: 2019-09-25 | DRG: 682 | Disposition: A | Discharge status: Home / Self Care | Payer: Medicare Other | Attending: Internal Medicine | Admitting: Internal Medicine

## 2019-09-23 ENCOUNTER — Other Ambulatory Visit: Payer: Self-pay

## 2019-09-23 ENCOUNTER — Encounter (HOSPITAL_COMMUNITY): Payer: Self-pay

## 2019-09-23 ENCOUNTER — Inpatient Hospital Stay (HOSPITAL_COMMUNITY): Payer: Medicare Other

## 2019-09-23 ENCOUNTER — Encounter (HOSPITAL_COMMUNITY): Payer: Self-pay | Admitting: Hematology

## 2019-09-23 ENCOUNTER — Inpatient Hospital Stay (HOSPITAL_BASED_OUTPATIENT_CLINIC_OR_DEPARTMENT_OTHER): Payer: Medicare Other | Admitting: Hematology

## 2019-09-23 DIAGNOSIS — J449 Chronic obstructive pulmonary disease, unspecified: Secondary | ICD-10-CM | POA: Diagnosis present

## 2019-09-23 DIAGNOSIS — C3491 Malignant neoplasm of unspecified part of right bronchus or lung: Secondary | ICD-10-CM | POA: Diagnosis present

## 2019-09-23 DIAGNOSIS — J9611 Chronic respiratory failure with hypoxia: Secondary | ICD-10-CM | POA: Diagnosis present

## 2019-09-23 DIAGNOSIS — I10 Essential (primary) hypertension: Secondary | ICD-10-CM | POA: Diagnosis present

## 2019-09-23 DIAGNOSIS — D72829 Elevated white blood cell count, unspecified: Secondary | ICD-10-CM | POA: Diagnosis present

## 2019-09-23 DIAGNOSIS — Z833 Family history of diabetes mellitus: Secondary | ICD-10-CM

## 2019-09-23 DIAGNOSIS — Z7951 Long term (current) use of inhaled steroids: Secondary | ICD-10-CM

## 2019-09-23 DIAGNOSIS — H9190 Unspecified hearing loss, unspecified ear: Secondary | ICD-10-CM | POA: Diagnosis present

## 2019-09-23 DIAGNOSIS — E0590 Thyrotoxicosis, unspecified without thyrotoxic crisis or storm: Secondary | ICD-10-CM | POA: Insufficient documentation

## 2019-09-23 DIAGNOSIS — E875 Hyperkalemia: Secondary | ICD-10-CM | POA: Diagnosis not present

## 2019-09-23 DIAGNOSIS — R918 Other nonspecific abnormal finding of lung field: Secondary | ICD-10-CM | POA: Diagnosis not present

## 2019-09-23 DIAGNOSIS — Z79899 Other long term (current) drug therapy: Secondary | ICD-10-CM

## 2019-09-23 DIAGNOSIS — C787 Secondary malignant neoplasm of liver and intrahepatic bile duct: Secondary | ICD-10-CM | POA: Diagnosis present

## 2019-09-23 DIAGNOSIS — Z9981 Dependence on supplemental oxygen: Secondary | ICD-10-CM

## 2019-09-23 DIAGNOSIS — E871 Hypo-osmolality and hyponatremia: Secondary | ICD-10-CM | POA: Diagnosis present

## 2019-09-23 DIAGNOSIS — N1832 Chronic kidney disease, stage 3b: Secondary | ICD-10-CM | POA: Diagnosis present

## 2019-09-23 DIAGNOSIS — E440 Moderate protein-calorie malnutrition: Secondary | ICD-10-CM | POA: Diagnosis not present

## 2019-09-23 DIAGNOSIS — U071 COVID-19: Secondary | ICD-10-CM

## 2019-09-23 DIAGNOSIS — E860 Dehydration: Secondary | ICD-10-CM | POA: Diagnosis present

## 2019-09-23 DIAGNOSIS — E039 Hypothyroidism, unspecified: Secondary | ICD-10-CM | POA: Diagnosis present

## 2019-09-23 DIAGNOSIS — K5010 Crohn's disease of large intestine without complications: Secondary | ICD-10-CM | POA: Diagnosis present

## 2019-09-23 DIAGNOSIS — N189 Chronic kidney disease, unspecified: Secondary | ICD-10-CM | POA: Diagnosis present

## 2019-09-23 DIAGNOSIS — R7401 Elevation of levels of liver transaminase levels: Secondary | ICD-10-CM

## 2019-09-23 DIAGNOSIS — N179 Acute kidney failure, unspecified: Secondary | ICD-10-CM | POA: Diagnosis not present

## 2019-09-23 DIAGNOSIS — Z681 Body mass index (BMI) 19 or less, adult: Secondary | ICD-10-CM

## 2019-09-23 DIAGNOSIS — M810 Age-related osteoporosis without current pathological fracture: Secondary | ICD-10-CM | POA: Diagnosis present

## 2019-09-23 DIAGNOSIS — R531 Weakness: Secondary | ICD-10-CM | POA: Diagnosis not present

## 2019-09-23 DIAGNOSIS — I129 Hypertensive chronic kidney disease with stage 1 through stage 4 chronic kidney disease, or unspecified chronic kidney disease: Secondary | ICD-10-CM | POA: Diagnosis present

## 2019-09-23 DIAGNOSIS — C3490 Malignant neoplasm of unspecified part of unspecified bronchus or lung: Secondary | ICD-10-CM | POA: Diagnosis not present

## 2019-09-23 DIAGNOSIS — C3411 Malignant neoplasm of upper lobe, right bronchus or lung: Secondary | ICD-10-CM

## 2019-09-23 DIAGNOSIS — Z8249 Family history of ischemic heart disease and other diseases of the circulatory system: Secondary | ICD-10-CM

## 2019-09-23 DIAGNOSIS — Z87891 Personal history of nicotine dependence: Secondary | ICD-10-CM

## 2019-09-23 LAB — T4, FREE: Free T4: 0.98 ng/dL (ref 0.61–1.12)

## 2019-09-23 LAB — CBC WITH DIFFERENTIAL/PLATELET
Abs Immature Granulocytes: 0.44 10*3/uL — ABNORMAL HIGH (ref 0.00–0.07)
Abs Immature Granulocytes: 0.4 10*3/uL — ABNORMAL HIGH (ref 0.00–0.07)
Basophils Absolute: 0.1 10*3/uL (ref 0.0–0.1)
Basophils Absolute: 0 10*3/uL (ref 0.0–0.1)
Basophils Relative: 0 %
Basophils Relative: 0 %
Eosinophils Absolute: 0.1 10*3/uL (ref 0.0–0.5)
Eosinophils Absolute: 0.1 10*3/uL (ref 0.0–0.5)
Eosinophils Relative: 1 %
Eosinophils Relative: 0 %
HCT: 43.2 % (ref 36.0–46.0)
HCT: 42.2 % (ref 36.0–46.0)
Hemoglobin: 12.9 g/dL (ref 12.0–15.0)
Hemoglobin: 12.7 g/dL (ref 12.0–15.0)
Immature Granulocytes: 2 %
Immature Granulocytes: 2 %
Lymphocytes Relative: 5 %
Lymphocytes Relative: 5 %
Lymphs Abs: 1.2 10*3/uL (ref 0.7–4.0)
Lymphs Abs: 1.2 10*3/uL (ref 0.7–4.0)
MCH: 25.1 pg — ABNORMAL LOW (ref 26.0–34.0)
MCH: 25.1 pg — ABNORMAL LOW (ref 26.0–34.0)
MCHC: 29.9 g/dL — ABNORMAL LOW (ref 30.0–36.0)
MCHC: 30.1 g/dL (ref 30.0–36.0)
MCV: 84 fL (ref 80.0–100.0)
MCV: 83.6 fL (ref 80.0–100.0)
Monocytes Absolute: 1.4 10*3/uL — ABNORMAL HIGH (ref 0.1–1.0)
Monocytes Absolute: 1.2 10*3/uL — ABNORMAL HIGH (ref 0.1–1.0)
Monocytes Relative: 5 %
Monocytes Relative: 5 %
Neutro Abs: 22.8 10*3/uL — ABNORMAL HIGH (ref 1.7–7.7)
Neutro Abs: 21.3 10*3/uL — ABNORMAL HIGH (ref 1.7–7.7)
Neutrophils Relative %: 87 %
Neutrophils Relative %: 88 %
Platelets: 140 10*3/uL — ABNORMAL LOW (ref 150–400)
Platelets: 139 10*3/uL — ABNORMAL LOW (ref 150–400)
RBC: 5.14 MIL/uL — ABNORMAL HIGH (ref 3.87–5.11)
RBC: 5.05 MIL/uL (ref 3.87–5.11)
RDW: 17.2 % — ABNORMAL HIGH (ref 11.5–15.5)
RDW: 17.2 % — ABNORMAL HIGH (ref 11.5–15.5)
WBC: 26 10*3/uL — ABNORMAL HIGH (ref 4.0–10.5)
WBC: 24.2 10*3/uL — ABNORMAL HIGH (ref 4.0–10.5)
nRBC: 0 % (ref 0.0–0.2)
nRBC: 0 % (ref 0.0–0.2)

## 2019-09-23 LAB — COMPREHENSIVE METABOLIC PANEL
ALT: 83 U/L — ABNORMAL HIGH (ref 0–44)
ALT: 80 U/L — ABNORMAL HIGH (ref 0–44)
AST: 178 U/L — ABNORMAL HIGH (ref 15–41)
AST: 177 U/L — ABNORMAL HIGH (ref 15–41)
Albumin: 2.4 g/dL — ABNORMAL LOW (ref 3.5–5.0)
Albumin: 2.3 g/dL — ABNORMAL LOW (ref 3.5–5.0)
Alkaline Phosphatase: 673 U/L — ABNORMAL HIGH (ref 38–126)
Alkaline Phosphatase: 639 U/L — ABNORMAL HIGH (ref 38–126)
Anion gap: 17 — ABNORMAL HIGH (ref 5–15)
Anion gap: 16 — ABNORMAL HIGH (ref 5–15)
BUN: 93 mg/dL — ABNORMAL HIGH (ref 8–23)
BUN: 91 mg/dL — ABNORMAL HIGH (ref 8–23)
CO2: 21 mmol/L — ABNORMAL LOW (ref 22–32)
CO2: 19 mmol/L — ABNORMAL LOW (ref 22–32)
Calcium: 8.1 mg/dL — ABNORMAL LOW (ref 8.9–10.3)
Calcium: 8 mg/dL — ABNORMAL LOW (ref 8.9–10.3)
Chloride: 90 mmol/L — ABNORMAL LOW (ref 98–111)
Chloride: 93 mmol/L — ABNORMAL LOW (ref 98–111)
Creatinine, Ser: 1.51 mg/dL — ABNORMAL HIGH (ref 0.44–1.00)
Creatinine, Ser: 1.46 mg/dL — ABNORMAL HIGH (ref 0.44–1.00)
GFR calc Af Amer: 40 mL/min — ABNORMAL LOW (ref >60)
GFR calc Af Amer: 42 mL/min — ABNORMAL LOW (ref >60)
GFR calc non Af Amer: 35 mL/min — ABNORMAL LOW (ref >60)
GFR calc non Af Amer: 36 mL/min — ABNORMAL LOW (ref >60)
Glucose, Bld: 107 mg/dL — ABNORMAL HIGH (ref 70–99)
Glucose, Bld: 91 mg/dL (ref 70–99)
Potassium: 5.9 mmol/L — ABNORMAL HIGH (ref 3.5–5.1)
Potassium: 6 mmol/L — ABNORMAL HIGH (ref 3.5–5.1)
Sodium: 128 mmol/L — ABNORMAL LOW (ref 135–145)
Sodium: 128 mmol/L — ABNORMAL LOW (ref 135–145)
Total Bilirubin: 2 mg/dL — ABNORMAL HIGH (ref 0.3–1.2)
Total Bilirubin: 2.2 mg/dL — ABNORMAL HIGH (ref 0.3–1.2)
Total Protein: 5.6 g/dL — ABNORMAL LOW (ref 6.5–8.1)
Total Protein: 5.3 g/dL — ABNORMAL LOW (ref 6.5–8.1)

## 2019-09-23 LAB — ABO/RH: ABO/RH(D): A POS

## 2019-09-23 LAB — BASIC METABOLIC PANEL
Anion gap: 15 (ref 5–15)
BUN: 87 mg/dL — ABNORMAL HIGH (ref 8–23)
CO2: 18 mmol/L — ABNORMAL LOW (ref 22–32)
Calcium: 7.7 mg/dL — ABNORMAL LOW (ref 8.9–10.3)
Chloride: 98 mmol/L (ref 98–111)
Creatinine, Ser: 1.39 mg/dL — ABNORMAL HIGH (ref 0.44–1.00)
GFR calc Af Amer: 44 mL/min — ABNORMAL LOW (ref >60)
GFR calc non Af Amer: 38 mL/min — ABNORMAL LOW (ref >60)
Glucose, Bld: 98 mg/dL (ref 70–99)
Potassium: 5 mmol/L (ref 3.5–5.1)
Sodium: 131 mmol/L — ABNORMAL LOW (ref 135–145)

## 2019-09-23 LAB — C-REACTIVE PROTEIN: CRP: 21 mg/dL — ABNORMAL HIGH (ref <1.0)

## 2019-09-23 LAB — RESPIRATORY PANEL BY RT PCR (FLU A&B, COVID)
Influenza A by PCR: NEGATIVE
Influenza B by PCR: NEGATIVE
SARS Coronavirus 2 by RT PCR: POSITIVE — AB

## 2019-09-23 LAB — FIBRINOGEN: Fibrinogen: 167 mg/dL — ABNORMAL LOW (ref 210–475)

## 2019-09-23 LAB — LACTATE DEHYDROGENASE: LDH: 2976 U/L — ABNORMAL HIGH (ref 98–192)

## 2019-09-23 LAB — FERRITIN: Ferritin: 2682 ng/mL — ABNORMAL HIGH (ref 11–307)

## 2019-09-23 LAB — CK: Total CK: 133 U/L (ref 38–234)

## 2019-09-23 LAB — TRIGLYCERIDES: Triglycerides: 361 mg/dL — ABNORMAL HIGH (ref <150)

## 2019-09-23 LAB — D-DIMER, QUANTITATIVE: D-Dimer, Quant: >20 ug/mL-FEU — ABNORMAL HIGH (ref 0.00–0.50)

## 2019-09-23 LAB — CBG MONITORING, ED: Glucose-Capillary: 70 mg/dL (ref 70–99)

## 2019-09-23 LAB — GLUCOSE, CAPILLARY: Glucose-Capillary: 75 mg/dL (ref 70–99)

## 2019-09-23 LAB — TSH: TSH: 5.195 u[IU]/mL — ABNORMAL HIGH (ref 0.350–4.500)

## 2019-09-23 MED ORDER — INSULIN ASPART 100 UNIT/ML IV SOLN
6.0000 [IU] | Freq: Once | INTRAVENOUS | Status: AC
  Administered 2019-09-23: 6 [IU] via INTRAVENOUS

## 2019-09-23 MED ORDER — SODIUM CHLORIDE 0.9 % IV BOLUS
500.0000 mL | Freq: Once | INTRAVENOUS | Status: AC
  Administered 2019-09-23: 15:00:00 500 mL via INTRAVENOUS

## 2019-09-23 MED ORDER — PROMETHAZINE HCL 12.5 MG PO TABS: 12.5000 mg | ORAL_TABLET | Freq: Four times a day (QID) | ORAL | Status: DC | PRN

## 2019-09-23 MED ORDER — SODIUM CHLORIDE 0.9 % IV SOLN: Freq: Once | INTRAVENOUS | Status: AC

## 2019-09-23 MED ORDER — HEPARIN SODIUM (PORCINE) 5000 UNIT/ML IJ SOLN
5000.0000 [IU] | Freq: Three times a day (TID) | INTRAMUSCULAR | Status: DC
  Administered 2019-09-23 – 2019-09-25 (×6): 5000 [IU] via SUBCUTANEOUS
  Filled 2019-09-23 (×6): qty 1

## 2019-09-23 MED ORDER — ACETAMINOPHEN 325 MG PO TABS: 650.0000 mg | ORAL_TABLET | Freq: Four times a day (QID) | ORAL | Status: DC | PRN

## 2019-09-23 MED ORDER — UMECLIDINIUM BROMIDE 62.5 MCG/INH IN AEPB
1.0000 | INHALATION_SPRAY | Freq: Every day | RESPIRATORY_TRACT | Status: DC
  Administered 2019-09-24 – 2019-09-25 (×2): 1 via RESPIRATORY_TRACT
  Filled 2019-09-23: qty 7

## 2019-09-23 MED ORDER — ALBUTEROL SULFATE HFA 108 (90 BASE) MCG/ACT IN AERS
2.0000 | INHALATION_SPRAY | Freq: Four times a day (QID) | RESPIRATORY_TRACT | Status: DC | PRN
  Administered 2019-09-25 (×2): 2 via RESPIRATORY_TRACT
  Filled 2019-09-23: qty 6.7

## 2019-09-23 MED ORDER — DEXAMETHASONE SODIUM PHOSPHATE 10 MG/ML IJ SOLN
6.0000 mg | Freq: Once | INTRAMUSCULAR | Status: AC
  Administered 2019-09-23: 6 mg via INTRAVENOUS
  Filled 2019-09-23: qty 1

## 2019-09-23 MED ORDER — SODIUM CHLORIDE 0.9 % IV BOLUS
500.0000 mL | Freq: Once | INTRAVENOUS | Status: AC
  Administered 2019-09-23: 18:00:00 500 mL via INTRAVENOUS

## 2019-09-23 MED ORDER — OXYCODONE HCL ER 10 MG PO T12A
10.0000 mg | EXTENDED_RELEASE_TABLET | Freq: Once | ORAL | Status: AC
  Administered 2019-09-23: 10 mg via ORAL
  Filled 2019-09-23: qty 1

## 2019-09-23 MED ORDER — IOHEXOL 300 MG/ML  SOLN
75.0000 mL | Freq: Once | INTRAMUSCULAR | Status: AC | PRN
  Administered 2019-09-23: 75 mL via INTRAVENOUS

## 2019-09-23 MED ORDER — OXYCODONE HCL 5 MG PO TABS
10.0000 mg | ORAL_TABLET | Freq: Four times a day (QID) | ORAL | Status: DC | PRN
  Administered 2019-09-23 – 2019-09-24 (×2): 10 mg via ORAL
  Filled 2019-09-23 (×3): qty 2

## 2019-09-23 MED ORDER — POLYETHYLENE GLYCOL 3350 17 G PO PACK: 17.0000 g | PACK | Freq: Every day | ORAL | Status: DC | PRN

## 2019-09-23 MED ORDER — TIOTROPIUM BROMIDE MONOHYDRATE 18 MCG IN CAPS: 18.0000 ug | ORAL_CAPSULE | Freq: Every evening | RESPIRATORY_TRACT | Status: DC

## 2019-09-23 MED ORDER — DEXTROSE 5 % IV SOLN: Freq: Once | INTRAVENOUS | Status: AC

## 2019-09-23 MED ORDER — GUAIFENESIN-DM 100-10 MG/5ML PO SYRP: 10.0000 mL | ORAL_SOLUTION | ORAL | Status: DC | PRN

## 2019-09-23 MED ORDER — FLUTICASONE PROPIONATE 50 MCG/ACT NA SUSP: 1.0000 | Freq: Every day | NASAL | Status: DC | PRN

## 2019-09-23 MED ORDER — SODIUM ZIRCONIUM CYCLOSILICATE 10 G PO PACK
10.0000 g | PACK | Freq: Once | ORAL | Status: AC
  Administered 2019-09-23: 10 g via ORAL
  Filled 2019-09-23: qty 1

## 2019-09-23 MED ORDER — CALCIUM GLUCONATE-NACL 1-0.675 GM/50ML-% IV SOLN
1.0000 g | Freq: Once | INTRAVENOUS | Status: AC
  Administered 2019-09-23: 1000 mg via INTRAVENOUS
  Filled 2019-09-23: qty 50

## 2019-09-23 MED ORDER — ENSURE ENLIVE PO LIQD
237.0000 mL | Freq: Two times a day (BID) | ORAL | Status: DC
  Administered 2019-09-24 – 2019-09-25 (×4): 237 mL via ORAL

## 2019-09-23 MED ORDER — SODIUM CHLORIDE 0.9 % IV SOLN: INTRAVENOUS | Status: AC

## 2019-09-23 MED ORDER — DEXTROSE 50 % IV SOLN
25.0000 mL | Freq: Once | INTRAVENOUS | Status: AC
  Administered 2019-09-23: 25 mL via INTRAVENOUS
  Filled 2019-09-23: qty 50

## 2019-09-23 MED ORDER — ORAL CARE MOUTH RINSE
15.0000 mL | Freq: Two times a day (BID) | OROMUCOSAL | Status: DC
  Administered 2019-09-23 – 2019-09-25 (×4): 15 mL via OROMUCOSAL

## 2019-09-23 MED ORDER — SODIUM CHLORIDE 0.9 % IV SOLN: 1.0000 g | Freq: Once | INTRAVENOUS | Status: DC

## 2019-09-23 NOTE — Progress Notes (Signed)
Patient has been assessed, vital signs and labs have been reviewed by Dr. Delton Coombes.NO treatment today due lab results. Sending to ER for evaluation.

## 2019-09-23 NOTE — Progress Notes (Signed)
Sodium 128 and Potassium 5.9 today with serum creatinine 1.51 also.  Patient for oncology follow up and treatment today.    Patient transported to the emergency room with oxygen and wheelchair.  Family at side.  No s/s of distress noted.  Dr. Delton Coombes to call the ER physician with report.

## 2019-09-23 NOTE — Progress Notes (Signed)
Stacey Nash, [place] [id number]   CLINIC:  Medical Oncology/Hematology  PCP:  Celene Squibb, MD [place] [address] [phone]   REASON FOR VISIT:  Metastatic lung cancer  CURRENT THERAPY: [place]  BRIEF ONCOLOGIC HISTORY:  Oncology History  Adenocarcinoma of lung, stage 4, right ([place])  08/28/2019 Initial Diagnosis   Adenocarcinoma of lung, stage 4, right ([place])   08/28/2019 Cancer Staging   Staging form: Lung, AJCC 8th Edition - Clinical stage from 08/28/2019: Stage IVB (cT2a, cN2, pM1c) - Signed by [name], MD on 08/28/2019   09/08/2019 Genetic Testing   PD-L1 IHC Analysis     09/11/2019 Genetic Testing   Foundation One CDx     09/24/2019 -  Chemotherapy   The patient had pembrolizumab (KEYTRUDA) 200 mg in sodium chloride 0.9 % 50 mL chemo infusion, 200 mg, Intravenous, Once, 0 of 6 cycles  for chemotherapy treatment.       CANCER STAGING: Cancer Staging Adenocarcinoma of lung, stage 4, right [facility]) Staging form: Lung, AJCC 8th Edition - Clinical stage from 08/28/2019: Stage IVB (cT2a, cN2, pM1c) - Signed by [name], MD on 08/28/2019    INTERVAL HISTORY:  Stacey Nash 69 y.o. female seen for follow-up of metastatic lung cancer. Appetite and energy levels are low at 25%. She feels weak today. She also has slight nausea. She reportedly took 1 pain medication this morning. She is slightly sleepy. Does not report any fevers at home. Temperature today is 97.   REVIEW OF SYSTEMS:  Review of Systems  Constitutional: Positive for fatigue.  Respiratory: Positive for shortness of breath.   Gastrointestinal: Positive for nausea.  All other systems reviewed and are negative.    PAST MEDICAL/SURGICAL HISTORY:  Past Medical History:  Diagnosis Date  . Allergy or intolerance to drug    Doxycycline caused worsening shortness of breath and loose stools.  . Asthma   . Chronic neck pain   . COPD  (chronic obstructive pulmonary disease) ([place])   . Crohn's colitis ([place]) 10/03/2018  . Diverticula of intestine   . Dyspnea   . Hypertension   . Left adrenal mass ([place])   . On home oxygen therapy   . Osteoporosis   . Tobacco abuse    Past Surgical History:  Procedure Laterality Date  . APPENDECTOMY     2013  . BIOPSY  10/01/2017   Procedure: BIOPSY;  Surgeon: Rogene Houston, MD;  Location: [facility];  Service: Endoscopy;;  left and right colon  . CHOLECYSTECTOMY    . COLONOSCOPY WITH PROPOFOL N/A 10/01/2017   Procedure: COLONOSCOPY WITH PROPOFOL;  Surgeon: Rogene Houston, MD;  Location: [facility];  Service: Endoscopy;  Laterality: N/A;  . ESOPHAGOGASTRODUODENOSCOPY N/A 09/09/2014   Procedure: ESOPHAGOGASTRODUODENOSCOPY (EGD);  Surgeon: Rogene Houston, MD;  Location: [facility];  Service: Endoscopy;  Laterality: N/A;  210 - moved to 9:10 - Ann to notify pt  . EYE SURGERY     cataract/implants  . LAPAROSCOPIC APPENDECTOMY  07/14/2011   Procedure: APPENDECTOMY LAPAROSCOPIC;  Surgeon: Donato Heinz, MD;  Location: [facility] ORS;  Service: General;  Laterality: N/A;  . POLYPECTOMY  10/01/2017   Procedure: POLYPECTOMY;  Surgeon: Rogene Houston, MD;  Location: [facility];  Service: Endoscopy;;  rectal     SOCIAL HISTORY:  Social History   Socioeconomic History  . Marital status: Widowed    Spouse name: Not on file  .  Number of children: Not on file  . Years of education: Not on file  . Highest education level: Not on file  Occupational History  . Not on file  Tobacco Use  . Smoking status: Former Smoker    Packs/day: 2.00    Years: 50.00    Pack years: 100.00    Types: Cigarettes    Quit date: 09/29/2013    Years since quitting: 5.9  . Smokeless tobacco: Never Used  . Tobacco comment: quit 4 yrs ago.   Substance and Sexual Activity  . Alcohol use: No    Alcohol/week: 0.0 standard drinks  . Drug use: No  . Sexual activity: Not Currently  Other Topics Concern  . Not  on file  Social History Narrative   Retired from book keeping and taxes.   Single, divorced.   3 boys.    Enjoys puzzles, reading, shopping.   Does not attend church, but watches tv church.   Does not eat red meat. Does not eat eggs or drink dairy.   Drives.   Wear seatbelt.    Youngest son lives with her.    Has grandchildren.    Social Determinants of Health   Financial Resource Strain:   . Difficulty of Paying Living Expenses:   Food Insecurity:   . Worried About Charity fundraiser in the Last Year:   . Arboriculturist in the Last Year:   Transportation Needs:   . Film/video editor (Medical):   Marland Kitchen Lack of Transportation (Non-Medical):   Physical Activity:   . Days of Exercise per Week:   . Minutes of Exercise per Session:   Stress:   . Feeling of Stress :   Social Connections:   . Frequency of Communication with Friends and Family:   . Frequency of Social Gatherings with Friends and Family:   . Attends Religious Services:   . Active Member of Clubs or Organizations:   . Attends Archivist Meetings:   Marland Kitchen Marital Status:   Intimate Partner Violence:   . Fear of Current or Ex-Partner:   . Emotionally Abused:   Marland Kitchen Physically Abused:   . Sexually Abused:     FAMILY HISTORY:  Family History  Problem Relation Age of Onset  . Hypertension Mother   . Thyroid disease Mother   . Diabetes Father   . Alcoholism Father   . Crohn's disease Brother   . Hypertension Son     CURRENT MEDICATIONS:  No facility-administered encounter medications on file as of 09/23/2019.   Outpatient Encounter Medications as of 09/23/2019  Medication Sig Note  . acetaminophen (TYLENOL) 500 MG tablet Take 1,000 mg by mouth daily as needed for mild pain, moderate pain or headache.    . albuterol (PROVENTIL HFA;VENTOLIN HFA) 108 (90 BASE) MCG/ACT inhaler Inhale 2 puffs into the lungs every 6 (six) hours as needed for wheezing or shortness of breath.    Marland Kitchen albuterol (PROVENTIL) (2.5  MG/3ML) 0.083% nebulizer solution Take 2.5 mg by nebulization every 6 (six) hours as needed for wheezing or shortness of breath.   Marland Kitchen aluminum hydroxide-magnesium carbonate (GAVISCON) 95-358 MG/15ML SUSP Take 15 mLs by mouth 3 (three) times daily as needed for indigestion or heartburn.    Marland Kitchen amLODipine (NORVASC) 5 MG tablet Take 1 tablet (5 mg total) by mouth daily. (Patient taking differently: Take 5 mg by mouth daily as needed. ) 09/23/2019: Patient has not had to take regularly due to blood pressure being low  .  Calcium Carb-Cholecalciferol (CALCIUM 600 + D PO) Take 1 tablet by mouth daily.    . Cholecalciferol (VITAMIN D3) 25 MCG (1000 UT) CAPS Take 2,000 Units by mouth daily.    . feeding supplement, ENSURE ENLIVE, (ENSURE ENLIVE) LIQD Take 237 mLs by mouth 2 (two) times daily between meals.   . fluticasone (FLONASE) 50 MCG/ACT nasal spray Place 1-2 sprays into both nostrils daily as needed.   . furosemide (LASIX) 20 MG tablet TAKE 0.5 TABLETS (10 MG TOTAL) BY MOUTH DAILY AS NEEDED. (Patient not taking: Reported on 09/23/2019) 09/23/2019: Patient last took 61m on 09/22/19; Dr. KZigmund Danielstopped medication on 09/23/19 per patient   . loperamide (IMODIUM) 2 MG capsule Take 1 capsule (2 mg total) by mouth 4 (four) times daily as needed for diarrhea or loose stools. 09/23/2019: Patient has if needed but has not had to take recently  . meclizine (ANTIVERT) 25 MG tablet Take 1 tablet (25 mg total) by mouth 3 (three) times daily as needed for dizziness.   . Menthol, Topical Analgesic, (ICY HOT EX) Apply 1 application topically daily as needed (pain).   . Misc. Devices (ROLLER WALKER) MISC Pt requires a roll-aider walker   . Misc. Devices MISC Rollaider walker   . Multiple Vitamins-Minerals (CENTRUM SILVER ULTRA WOMENS PO) Take by mouth daily.   . Omega-3 Fatty Acids (FISH OIL) 1000 MG CAPS Take 1,000 mg by mouth 2 (two) times daily.    . Oxycodone HCl 10 MG TABS Take 1 tablet (10 mg total) by mouth every 6 (six)  hours as needed.   . OXYGEN Inhale 2 L into the lungs daily.   . polyethylene glycol (MIRALAX / GLYCOLAX) 17 g packet Take 17 g by mouth daily. (Patient taking differently: Take 17 g by mouth daily as needed. ) 09/23/2019: Patient has if needed but has not had to take recently  . promethazine (PHENERGAN) 25 MG tablet Take 12.5-25 mg by mouth every 6 (six) hours as needed.   . propranolol (INDERAL) 10 MG tablet TAKE 1 TABLET BY MOUTH TWICE A DAY (Patient taking differently: Take 10 mg by mouth 2 (two) times daily. )   . senna (SENOKOT) 8.6 MG TABS tablet Take 1 tablet (8.6 mg total) by mouth at bedtime as needed for mild constipation. 09/23/2019: Patient has if needed but has not had to take recently  . sodium chloride 0.9 % SOLN 50 mL with pembrolizumab 100 MG/4ML SOLN 2 mg/kg Inject 200 mg into the vein every 21 ( twenty-one) days.   .Marland Kitchentiotropium (SPIRIVA HANDIHALER) 18 MCG inhalation capsule Place 18 mcg into inhaler and inhale every evening.      ALLERGIES:  Allergies  Allergen Reactions  . Sulfa Antibiotics Shortness Of Breath  . Tetanus Toxoids Rash  . Doxycycline Diarrhea  . Statins     Leg pain and weakness  . Zofran [Ondansetron Hcl] Hives and Rash    Rash at injection site that spread immediately following administration       PHYSICAL EXAM:  ECOG Performance status: 1  Vitals:   09/23/19 1054  BP: (!) 122/35  Pulse: 93  Resp: 18  Temp: (!) 96.8 F (36 C)  SpO2: 97%   Filed Weights   09/23/19 1054  Weight: 105 lb 1.6 oz (47.7 kg)    Physical Exam Vitals reviewed.  Constitutional:      Appearance: Normal appearance.  HENT:     Head: Normocephalic and atraumatic.  Eyes:     Conjunctiva/sclera: Conjunctivae normal.  Cardiovascular:     Rate and Rhythm: Regular rhythm.  Pulmonary:     Effort: Pulmonary effort is normal.     Breath sounds: Normal breath sounds.  Abdominal:     Palpations: Abdomen is soft.  Skin:    General: Skin is warm.  Neurological:      General: No focal deficit present.     Mental Status: She is alert and oriented to person, place, and time.  Psychiatric:        Mood and Affect: Mood normal.        Behavior: Behavior normal.      LABORATORY DATA:  I have reviewed the labs as listed.  CBC    Component Value Date/Time   WBC 24.2 (H) 09/23/2019 1244   RBC 5.05 09/23/2019 1244   HGB 12.7 09/23/2019 1244   HCT 42.2 09/23/2019 1244   PLT 139 (L) 09/23/2019 1244   MCV 83.6 09/23/2019 1244   MCH 25.1 (L) 09/23/2019 1244   MCHC 30.1 09/23/2019 1244   RDW 17.2 (H) 09/23/2019 1244   LYMPHSABS 1.2 09/23/2019 1244   MONOABS 1.2 (H) 09/23/2019 1244   EOSABS 0.1 09/23/2019 1244   BASOSABS 0.0 09/23/2019 1244   CMP Latest Ref Rng & Units 09/23/2019 09/23/2019 08/21/2019  Glucose 70 - 99 mg/dL 91 107(H) 112(H)  BUN 8 - 23 mg/dL 91(H) 93(H) 11  Creatinine 0.44 - 1.00 mg/dL 1.46(H) 1.51(H) 0.49  Sodium 135 - 145 mmol/L 128(L) 128(L) 137  Potassium 3.5 - 5.1 mmol/L 6.0(H) 5.9(H) 3.8  Chloride 98 - 111 mmol/L 93(L) 90(L) 102  CO2 22 - 32 mmol/L 19(L) 21(L) 24  Calcium 8.9 - 10.3 mg/dL 8.0(L) 8.1(L) 8.2(L)  Total Protein 6.5 - 8.1 g/dL 5.3(L) 5.6(L) 5.7(L)  Total Bilirubin 0.3 - 1.2 mg/dL 2.2(H) 2.0(H) 0.7  Alkaline Phos 38 - 126 U/L 639(H) 673(H) 270(H)  AST 15 - 41 U/L 177(H) 178(H) 34  ALT 0 - 44 U/L 80(H) 83(H) 21       DIAGNOSTIC IMAGING:  I have reviewed scans.     ASSESSMENT & PLAN:   Adenocarcinoma of lung, stage 4, right ([place]) 1. 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 Foundation 1 with no targetable mutations. -CT CAP on 2019-08-19 showed 3.8 cm spiculated right upper lobe lung mass, hilar and paratracheal lymph nodes, innumerable liver mets. Brain MRI was negative. -Based on her performance status we have recommended single agent Keytruda. -Today she feels very weak. Also has right upper quadrant pain. -We reviewed her labs. LFTs are elevated. She also has elevated creatinine.  White count is elevated at 24. -I have recommended to hold off on starting her immunotherapy today. I will talk to the ER attending and send her to the ER immediately for further evaluation.  2. Right upper quadrant pain: -She is taking OxyContin 10 mg twice daily and oxycodone 10 mg 1 tablet in between which is helping.  3. Difficulty ambulation: -She has difficult ambulation from leg swellings. She also has low albumin.  4. Lower extremity swelling: -She is a vegetarian and has a low albumin. She met with our dietitian. Will consider checking a B12 level. -She has increased Lasix to 20 mg daily. This is probably caused her creatinine to go up along with hyperkalemia.      Orders placed this encounter:  No orders of the defined types were placed in this encounter.     [name], MD [place] [phone]

## 2019-09-23 NOTE — ED Provider Notes (Signed)
[facility] EMERGENCY DEPARTMENT Provider Note   CSN: [id number] Arrival date & time: 09/23/19  1204     History Chief Complaint  Patient presents with  . Abnormal Lab    Stacey Nash is a 69 y.o. 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  Patient was at oncology office today and was found to have hyperkalemia, hyponatremia and abnormal liver enzymes so she sent to the emergency department for further evaluation.  She was supposed to start her first  [place] treatment today. Oncologist is Dr. Delton Coombes.  Patient's daughter-in-law is at the bedside and is contributing historian.  She states for the last 3 days patient has been her usual self.  Today patient was complaining of feeling generalized weakness and very tired.  She is also having right upper quadrant abdominal pain x3 months.  She describes the pain as a constant aching sensation.  She rates the pain 10 of 10 in severity.  She takes OxyContin for pain however has not taken her any today.  She has also noticed bilateral lower extremity edema.  She takes Lasix 20 mg prn and does not feel this is helping.  She denies any fever, chills, cough, nausea, vomiting, urinary symptoms, diarrhea. She also denies any recent fall, injury, or prolonged down time.  History provided by patient and daughter with additional history obtained from chart review.     Past Medical History:  Diagnosis Date  . Allergy or intolerance to drug    Doxycycline caused worsening shortness of breath and loose stools.  . Asthma   . Chronic neck pain   . COPD (chronic obstructive pulmonary disease) ([place])   . Crohn's colitis ([place]) 10/03/2018  . Diverticula of intestine   . Dyspnea   . Hypertension   . Left adrenal mass ([place])   . On home oxygen therapy   .  Osteoporosis   . Tobacco abuse     Patient Active Problem List   Diagnosis Date Noted  . AKI (acute kidney injury) ([place]) 09/23/2019  . Adenocarcinoma of lung, stage 4, right ([place]) 08/28/2019  . Goals of care, counseling/discussion   . Palliative care by specialist   . Lung mass   . Liver mass   . Lung cancer ([place]) 08/19/2019  . Chronic respiratory failure with hypoxia ([place]) 08/19/2019  . COPD with asthma ([place]) 08/19/2019  . Tachycardia 08/19/2019  . Fever 08/19/2019  . Crohn's colitis ([place]) 10/03/2018  . Essential hypertension, benign 02/14/2018  . HTN, goal below 140/90 02/14/2018  . Adrenal mass ([place]) 02/14/2018  . Hyperthyroidism 10/16/2017  . Guaiac positive stools 09/25/2017  . Diarrhea 09/25/2017  . COPD exacerbation ([place]) 04/14/2013  . Hyperglycemia, drug-induced 04/14/2013  . Acute respiratory failure with hypoxia ([place]) 04/14/2013  . Hypokalemia 04/14/2013  . Allergy or intolerance to drug 04/14/2013  . Tobacco abuse 04/14/2013    Past Surgical History:  Procedure Laterality Date  . APPENDECTOMY     2013  . BIOPSY  10/01/2017   Procedure: BIOPSY;  Surgeon: Rogene Houston, MD;  Location: [facility] ENDO SUITE;  Service: Endoscopy;;  left and right colon  . CHOLECYSTECTOMY    . COLONOSCOPY WITH PROPOFOL N/A 10/01/2017   Procedure: COLONOSCOPY WITH PROPOFOL;  Surgeon: Rogene Houston, MD;  Location: [facility] ENDO SUITE;  Service: Endoscopy;  Laterality: N/A;  . ESOPHAGOGASTRODUODENOSCOPY N/A 09/09/2014  Procedure: ESOPHAGOGASTRODUODENOSCOPY (EGD);  Surgeon: Rogene Houston, MD;  Location: [facility] ENDO SUITE;  Service: Endoscopy;  Laterality: N/A;  210 - moved to 9:10 - Ann to notify pt  . EYE SURGERY     cataract/implants  . LAPAROSCOPIC APPENDECTOMY  07/14/2011   Procedure: APPENDECTOMY LAPAROSCOPIC;  Surgeon: Donato Heinz, MD;  Location: [facility] ORS;  Service: General;  Laterality: N/A;  . POLYPECTOMY  10/01/2017   Procedure: POLYPECTOMY;  Surgeon: Rogene Houston, MD;  Location: [facility]  ENDO SUITE;  Service: Endoscopy;;  rectal     OB History   No obstetric history on file.     Family History  Problem Relation Age of Onset  . Hypertension Mother   . Thyroid disease Mother   . Diabetes Father   . Alcoholism Father   . Crohn's disease Brother   . Hypertension Son     Social History   Tobacco Use  . Smoking status: Former Smoker    Packs/day: 2.00    Years: 50.00    Pack years: 100.00    Types: Cigarettes    Quit date: 09/29/2013    Years since quitting: 5.9  . Smokeless tobacco: Never Used  . Tobacco comment: quit 4 yrs ago.   Substance Use Topics  . Alcohol use: No    Alcohol/week: 0.0 standard drinks  . Drug use: No    Home Medications Prior to Admission medications   Medication Sig Start Date End Date Taking? Authorizing Provider  acetaminophen (TYLENOL) 500 MG tablet Take 1,000 mg by mouth daily as needed for mild pain, moderate pain or headache.     [provider]  albuterol (PROVENTIL HFA;VENTOLIN HFA) 108 (90 BASE) MCG/ACT inhaler Inhale 2 puffs into the lungs every 6 (six) hours as needed for wheezing or shortness of breath.     [provider]  albuterol (PROVENTIL) (2.5 MG/3ML) 0.083% nebulizer solution Take 2.5 mg by nebulization every 6 (six) hours as needed for wheezing or shortness of breath.    [provider]  aluminum hydroxide-magnesium carbonate (GAVISCON) 95-358 MG/15ML SUSP Take 15 mLs by mouth 3 (three) times daily as needed for indigestion or heartburn.     [provider]  amLODipine (NORVASC) 5 MG tablet Take 1 tablet (5 mg total) by mouth daily. 02/14/18   Cassandria Anger, MD  Calcium Carb-Cholecalciferol (CALCIUM 600 + D PO) Take 1 tablet by mouth daily.     [provider]  Cholecalciferol (VITAMIN D3) 25 MCG (1000 UT) CAPS Take 2,000 Units by mouth daily.     [provider]  feeding supplement, ENSURE ENLIVE, (ENSURE ENLIVE) LIQD Take 237 mLs by mouth 2 (two) times  daily between meals. 08/21/19   Manuella Ghazi, Pratik D, DO  fluticasone (FLONASE) 50 MCG/ACT nasal spray Place 1-2 sprays into both nostrils daily as needed. 04/28/19   [provider]  furosemide (LASIX) 20 MG tablet TAKE 0.5 TABLETS (10 MG TOTAL) BY MOUTH DAILY AS NEEDED. 09/22/19   [name], MD  loperamide (IMODIUM) 2 MG capsule Take 1 capsule (2 mg total) by mouth 4 (four) times daily as needed for diarrhea or loose stools. 03/07/16   Horton, Barbette Hair, MD  meclizine (ANTIVERT) 25 MG tablet Take 1 tablet (25 mg total) by mouth 3 (three) times daily as needed for dizziness. 09/15/19   [name], MD  Menthol, Topical Analgesic, (ICY HOT EX) Apply 1 application topically daily as needed (pain).    [provider]  Misc. Devices (  ROLLER WALKER) MISC Pt requires a roll-aider walker 09/18/19   [name], MD  Misc. Devices MISC Rollaider walker 09/16/19   [name], MD  Multiple Vitamins-Minerals (CENTRUM SILVER ULTRA WOMENS PO) Take by mouth daily.    [provider]  Omega-3 Fatty Acids (FISH OIL) 1000 MG CAPS Take 1,000 mg by mouth 2 (two) times daily.     [provider]  Oxycodone HCl 10 MG TABS Take 1 tablet (10 mg total) by mouth every 6 (six) hours as needed. 09/15/19   [name], MD  OXYGEN Inhale 2 L into the lungs daily.    [provider]  polyethylene glycol (MIRALAX / GLYCOLAX) 17 g packet Take 17 g by mouth daily. 08/22/19   Manuella Ghazi, Pratik D, DO  promethazine (PHENERGAN) 25 MG tablet Take 12.5-25 mg by mouth every 6 (six) hours as needed. 08/06/19   [provider]  propranolol (INDERAL) 10 MG tablet TAKE 1 TABLET BY MOUTH TWICE A DAY 03/06/18   Cassandria Anger, MD  senna (SENOKOT) 8.6 MG TABS tablet Take 1 tablet (8.6 mg total) by mouth at bedtime as needed for mild constipation. 08/21/19   Manuella Ghazi, Pratik D, DO  sodium chloride 0.9 % SOLN 50 mL with pembrolizumab 100 MG/4ML SOLN 2 mg/kg Inject  200 mg into the vein every 21 ( twenty-one) days. 09/23/19   [provider]  tiotropium (SPIRIVA HANDIHALER) 18 MCG inhalation capsule Place 18 mcg into inhaler and inhale every evening.     [provider]    Allergies    Sulfa antibiotics, Tetanus toxoids, Doxycycline, Statins, and Zofran [ondansetron hcl]  Review of Systems   Review of Systems  All other systems are reviewed and are negative for acute change except as noted in the HPI.   Physical Exam Updated Vital Signs BP (!) 107/55   Pulse 89   Temp (!) 97.5 F (36.4 C) (Oral)   Resp 15   Ht 5' 2"  (1.575 m)   Wt 47.7 kg   SpO2 99%   BMI 19.22 kg/m   Physical Exam Vitals and nursing note reviewed.  Constitutional:      General: She is not in acute distress.    Appearance: She is not toxic-appearing.     Comments: Chronically ill-appearing female.  Frail.  HENT:     Head: Normocephalic and atraumatic.     Right Ear: Tympanic membrane and external ear normal.     Left Ear: Tympanic membrane and external ear normal.     Nose: Nose normal.     Mouth/Throat:     Mouth: Mucous membranes are dry.     Pharynx: Oropharynx is clear.  Eyes:     General: No scleral icterus.       Right eye: No discharge.        Left eye: No discharge.     Extraocular Movements: Extraocular movements intact.     Conjunctiva/sclera: Conjunctivae normal.     Pupils: Pupils are equal, round, and reactive to light.  Neck:     Vascular: No JVD.  Cardiovascular:     Rate and Rhythm: Normal rate and regular rhythm.     Pulses: Normal pulses.          Radial pulses are 2+ on the right side and 2+ on the left side.     Heart sounds: Normal heart sounds.  Pulmonary:     Comments: Lungs clear to auscultation in all fields. Symmetric chest rise. No wheezing, rales,  or rhonchi. Abdominal:     Comments: Abdomen is soft, non-distended.  Tenderness to palpation in right upper quadrant with voluntary guarding.  No rigidity.  No  peritoneal signs.  Normoactive bowel sounds.  No CVA tenderness.  Musculoskeletal:        General: Normal range of motion.     Cervical back: Normal range of motion.     Right lower leg: 1+ Pitting Edema present.     Left lower leg: 1+ Pitting Edema present.  Skin:    General: Skin is warm and dry.     Capillary Refill: Capillary refill takes less than 2 seconds.  Neurological:     Mental Status: She is oriented to person, place, and time.     GCS: GCS eye subscore is 4. GCS verbal subscore is 5. GCS motor subscore is 6.     Comments: Fluent speech, no facial droop.  Psychiatric:        Behavior: Behavior normal.     ED Results / Procedures / Treatments   Labs (all labs ordered are listed, but only abnormal results are displayed) Labs Reviewed  COMPREHENSIVE METABOLIC PANEL - Abnormal; Notable for the following components:      Result Value   Sodium 128 (*)    Potassium 6.0 (*)    Chloride 93 (*)    CO2 19 (*)    BUN 91 (*)    Creatinine, Ser 1.46 (*)    Calcium 8.0 (*)    Total Protein 5.3 (*)    Albumin 2.3 (*)    AST 177 (*)    ALT 80 (*)    Alkaline Phosphatase 639 (*)    Total Bilirubin 2.2 (*)    GFR calc non Af Amer 36 (*)    GFR calc Af Amer 42 (*)    Anion gap 16 (*)    All other components within normal limits  CBC WITH DIFFERENTIAL/PLATELET - Abnormal; Notable for the following components:   WBC 24.2 (*)    MCH 25.1 (*)    RDW 17.2 (*)    Platelets 139 (*)    Neutro Abs 21.3 (*)    Monocytes Absolute 1.2 (*)    Abs Immature Granulocytes 0.40 (*)    All other components within normal limits  RESPIRATORY PANEL BY RT PCR (FLU A&B, COVID)  CK  URINALYSIS, ROUTINE W REFLEX MICROSCOPIC    EKG EKG Interpretation  Date/Time:  Tuesday September 23 2019 13:54:14 EDT Ventricular Rate:  89 PR Interval:    QRS Duration: 95 QT Interval:  350 QTC Calculation: 426 R Axis:   73 Text Interpretation: Sinus rhythm Anteroseptal infarct, age indeterminate Artifact  Confirmed by Elnora Morrison [phone]) on 09/23/2019 2:50:41 PM   Radiology CT ABDOMEN PELVIS W CONTRAST  Result Date: 09/23/2019 CLINICAL DATA:  Lung carcinoma with known liver metastasis with right upper quadrant pain EXAM: CT ABDOMEN AND PELVIS WITH CONTRAST TECHNIQUE: Multidetector CT imaging of the abdomen and pelvis was performed using the standard protocol following bolus administration of intravenous contrast. CONTRAST:  93m OMNIPAQUE IOHEXOL 300 MG/ML  SOLN COMPARISON:  08/19/2019 FINDINGS: Lower chest: Lung bases are free of acute infiltrate or sizable effusion. Hepatobiliary: Liver demonstrates innumerable large centrally hypodense lesions which have increased in the interval from the prior exam in both size and number consistent with progression of metastatic disease. Index lesion in segment 8 now measures 4.0 x 3.3 cm and previously measured 2.2 x 2.8 cm. Segment 2 index lesion previously measured 2  cm in greatest dimension and now measures 3.3 cm in greatest dimension. Segment 7 index lesion previously measured 5.1 cm in greatest dimension and now measures at least 5.9 cm in greatest dimension and appears more confluent with smaller adjacent lesions. Segment 3 index lesion that measured 2.1 cm now measures approximately 3.9 cm in greatest dimension. 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 There also changes of tumor burden extending into the confluence of the inferior vena cava and right atrium. Gallbladder has been surgically removed. Pancreas: Unremarkable. No pancreatic ductal dilatation or surrounding inflammatory changes. Spleen: Normal in size without focal abnormality. Adrenals/Urinary Tract: Right adrenal gland again demonstrates mild nodularity. The left adrenal gland is enlarged with a partially  calcified partially fatty mass lesion measuring 3.1 x 3.1 cm. 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 The kidneys show no normal enhancement bilaterally with normal excretion of contrast material. No renal calculi or obstructive changes are seen. The bladder is well distended. Stomach/Bowel: Diverticular change of the colon is noted without evidence of diverticulitis. The colon shows no significant obstructive or inflammatory changes. The appendix has been surgically removed. No small bowel or gastric abnormality is noted. Vascular/Lymphatic: Atherosclerotic calcifications of the abdominal aorta are noted. No aneurysmal dilatation is seen. No definitive lymphadenopathy is noted. Reproductive: Uterus and bilateral adnexa are unremarkable. Surgical clips are noted which are likely related to prior tubal ligation bilaterally. Other: Mild free fluid is noted within the pelvis new from the prior exam. No herniation is identified. Musculoskeletal: Degenerative changes of lumbar spine are noted. No definitive lytic or sclerotic lesions are seen. No compression deformities are noted. IMPRESSION: Significant increase in both size and number of hepatic metastatic lesions when compared with the recent exam from one month previous. Additionally, there is now further ingrowth of tumor into portal venous branches as well as into the hepatic venous branches with areas of tumor extending into the confluence of the IVC and right atrium. New mild ascites is noted as well. Diverticulosis without diverticulitis. Stable adrenal lesions bilaterally. Electronically Signed   By: Inez Catalina M.D.   On: 09/23/2019 15:44   DG Chest Portable 1 View  Result Date: 09/23/2019 CLINICAL DATA:  Weakness, history of metastatic lung cancer. EXAM: PORTABLE CHEST 1 VIEW COMPARISON:  August 19, 2019. FINDINGS: The heart size and mediastinal contours are within normal limits. No  pneumothorax or pleural effusion is noted. Stable large right midlung mass is noted consistent with malignancy. Left lung is clear. The visualized skeletal structures are unremarkable. IMPRESSION: Stable large right midlung mass consistent with malignancy. No significant change compared to prior exam. Electronically Signed   By: Marijo Conception M.D.   On: 09/23/2019 12:44    Procedures .Critical Care Performed by: Cherre Robins, PA-C Authorized by: Cherre Robins, PA-C   Critical care provider statement:    Critical care time (minutes):  38   Critical care was necessary to treat or prevent imminent or life-threatening deterioration of the following conditions:  Metabolic crisis (metabolic crisis)   Critical care was time spent personally by me on the following activities:  Blood draw for specimens, development of treatment plan with patient or surrogate, discussions with consultants, evaluation of patient's response to treatment, obtaining history from patient or surrogate, ordering  and performing treatments and interventions, ordering and review of laboratory studies, ordering and review of radiographic studies, pulse oximetry, re-evaluation of patient's condition and review of old charts   (including critical care time)  Medications Ordered in ED Medications  oxyCODONE (OXYCONTIN) 12 hr tablet 10 mg (10 mg Oral Given 09/23/19 1326)  dextrose 5 % solution ( Intravenous Stopped 09/23/19 1610)  insulin aspart (novoLOG) injection 6 Units (6 Units Intravenous Given 09/23/19 1500)  sodium chloride 0.9 % bolus 500 mL (0 mLs Intravenous Stopped 09/23/19 1610)  calcium gluconate 1 g/ 50 mL sodium chloride IVPB (0 g Intravenous Stopped 09/23/19 1610)  iohexol (OMNIPAQUE) 300 MG/ML solution 75 mL (75 mLs Intravenous Contrast Given 09/23/19 1508)    ED Course  I have reviewed the triage vital signs and the nursing notes.  Pertinent labs & imaging results that were available during my care of  the patient were reviewed by me and considered in my medical decision making (see chart for details).    MDM Rules/Calculators/A&P                      Patient seen and examined. Patient presents awake, alert, hemodynamically stable, afebrile, non toxic.  She is chronically ill-appearing and frail.  She is on 2 L nasal cannula is her baseline with oxygen saturation of 98%.  Does appear dehydrated, mucous membranes are dry, decreased skin turgor on hands.  She has right upper quadrant abdominal tenderness with voluntary guarding, no peritoneal signs.  Normoactive bowel sounds.  She also has 1+ bilateral lower extremity edema.  She has multiple lab abnormalities today including leukocytosis of 24.2.  She also has multiple electrolyte abnormalities including hyponatremia of 128, hyperkalemia 6.0, elevated BUN/creatinine at 91/1.46, hypocalcemia 8.0.  Liver enzymes are elevated as well, AST 177, ALT 80, alk phos 639 and total bilirubin 2.2.  She has an elevated anion gap of 16.  CK is within normal range.  EKG shows sinus rhythm, no T wave inversions.  As her glucose was 91, will give dextrose and insulin for her hyperkalemia.  We will also give calcium gluconate and small fluid bolus for her elevated BUN and dehydration. Chest x-ray viewed by me shows no changes when compared to prior x 1 month ago, no malignancy. CT abdomen pelvis viewed by me and shows a significant increase in hepatic metastatic lesion.  She has a new mild ascites.  She has diverticulosis without diverticulitis. This case was discussed with Dr. Reather Converse who has seen the patient and agrees with plan to admit. Spoke with Dr. Arlyce Dice with hospitalist service who agrees to assume care of patient and bring into the hospital for further evaluation and management. Appreciate admission.    Portions of this note were generated with [employer]. Dictation errors may occur despite best attempts at proofreading.   Final Clinical  Impression(s) / ED Diagnoses Final diagnoses:  AKI (acute kidney injury) ([place])  Hyponatremia  Hyperkalemia  Transaminitis    Rx / DC Orders ED Discharge Orders    None       Flint Melter 09/23/19 1621    Elnora Morrison, MD 09/24/19 1616

## 2019-09-23 NOTE — H&P (Addendum)
History and Physical    THERASA LORENZI ZOX:[id number] DOB: 17-May-1951 DOA: 09/23/2019  PCP: Celene Squibb, MD   Patient coming from: Home  I have personally briefly reviewed patient's old medical records in [place]  Chief Complaint: Weakness, lethargy, abnormal labs  HPI: LADY WISHAM is a 69 y.o. 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 Presented today to the cancer center to start chemotherapy with Keytruda, but this was deferred for today, due to abnormal labs.  Patient reported poor appetite, generalized weakness.  Blood work at the cancer center showed sodium of 128, potassium of 5.9, elevated creatinine, elevated liver enzymes.  She was subsequently referred to the ED. Patient's daughter in law [name] is at bedside and assist with the history.  Reports over the past several weeks patient has gradually become more weak, yesterday she was unable to stand up from the commode.  No falls.  Reports poor p.o. intake over the past several weeks.  No vomiting, no loose stools, reports chronic right flank pain.  Chronic unchanged difficulty breathing, no cough.  No chest pain.  No pain with urination, no urinary frequency.  Reports bilateral lower extremity swelling that has not improved despite daily Lasix use.  Recent hospitalization 3/23-3/25 for SIRs-tachycardia, fever and leukocytosis thought secondary to high burden of metastatic disease and primary lung cancer with dehydration.  Blood and urine cultures were unremarkable.  ED Course: Blood pressure systolic 10 3-1 1 4, temperature 97.5.  Blood work in the ED showed WBC 24.  Hyponatremia 128, potassium 6, serum bicarb of 19, anion gap of 16, BUN 91, creatinine of 1.46, AST of 177, ALT of 80, ALP 639, total bilirubin 2.2.  Tissues significant increase in both size and number of hepatic metastatic lesions, also further input of  tumor into portal venous branches (please see detailed report).  Port chest x-ray stable large lung mass without acute abnormality.  581ms bolus of fluids given, 1 g calcium gluconate given.  Hospitalist to admit for further evaluation and management.  Review of Systems: As per HPI all other systems reviewed and negative.  Past Medical History:  Diagnosis Date  . Allergy or intolerance to drug    Doxycycline caused worsening shortness of breath and loose stools.  . Asthma   . Chronic neck pain   . COPD (chronic obstructive pulmonary disease) ([place]   . [place] 10/03/2018  . Diverticula of intestine   . Dyspnea   . Hypertension   . Left adrenal mass ([facility]   . On home oxygen therapy   . Osteoporosis   . Tobacco abuse     Past Surgical History:  Procedure Laterality Date  . APPENDECTOMY     2013  . BIOPSY  10/01/2017   Procedure: BIOPSY;  Surgeon: RRogene Houston MD;  [facility];  Service: Endoscopy;;  left and right colon  . CHOLECYSTECTOMY    . COLONOSCOPY WITH PROPOFOL N/A 10/01/2017   Procedure: COLONOSCOPY WITH PROPOFOL;  Surgeon: RRogene Houston MD;  Location: [facility];  Service: Endoscopy;  Laterality: N/A;  . ESOPHAGOGASTRODUODENOSCOPY N/A 09/09/2014   Procedure: ESOPHAGOGASTRODUODENOSCOPY (EGD);  Surgeon: NRogene Houston MD;  Location: [facility];  Service: Endoscopy;  Laterality: N/A;  210 - moved to 9:10 - Ann to notify pt  . EYE SURGERY     cataract/implants  . LAPAROSCOPIC APPENDECTOMY  07/14/2011   Procedure:  APPENDECTOMY LAPAROSCOPIC;  Surgeon: Donato Heinz, MD;  Location: [facility];  Service: General;  Laterality: N/A;  . POLYPECTOMY  10/01/2017   Procedure: POLYPECTOMY;  Surgeon: Rogene Houston, MD;  Location[facility];  Service: Endoscopy;;  rectal     reports that she quit smoking about 5 years ago. Her smoking use included cigarettes. She has a 100.00 pack-year smoking history. She has never used smokeless tobacco. She  reports that she does not drink alcohol or use drugs.  Allergies  Allergen Reactions  . Sulfa Antibiotics Shortness Of Breath  . Tetanus Toxoids Rash  . Doxycycline Diarrhea  . Statins     Leg pain and weakness  . Zofran [Ondansetron Hcl] Hives and Rash    Rash at injection site that spread immediately following administration      Family History  Problem Relation Age of Onset  . Hypertension Mother   . Thyroid disease Mother   . Diabetes Father   . Alcoholism Father   . Crohn's disease Brother   . Hypertension Son     Prior to Admission medications   Medication Sig Start Date End Date Taking? Authorizing Provider  acetaminophen (TYLENOL) 500 MG tablet Take 1,000 mg by mouth daily as needed for mild pain, moderate pain or headache.     [provider]  albuterol (PROVENTIL HFA;VENTOLIN HFA) 108 (90 BASE) MCG/ACT inhaler Inhale 2 puffs into the lungs every 6 (six) hours as needed for wheezing or shortness of breath.     [provider]  albuterol (PROVENTIL) (2.5 MG/3ML) 0.083% nebulizer solution Take 2.5 mg by nebulization every 6 (six) hours as needed for wheezing or shortness of breath.    [provider]  aluminum hydroxide-magnesium carbonate (GAVISCON) 95-358 MG/15ML SUSP Take 15 mLs by mouth 3 (three) times daily as needed for indigestion or heartburn.     [provider]  amLODipine (NORVASC) 5 MG tablet Take 1 tablet (5 mg total) by mouth daily. 02/14/18   Cassandria Anger, MD  Calcium Carb-Cholecalciferol (CALCIUM 600 + D PO) Take 1 tablet by mouth daily.     [provider]  Cholecalciferol (VITAMIN D3) 25 MCG (1000 UT) CAPS Take 2,000 Units by mouth daily.     [provider]  feeding supplement, ENSURE ENLIVE, (ENSURE ENLIVE) LIQD Take 237 mLs by mouth 2 (two) times daily between meals. 08/21/19   Manuella Ghazi, Pratik D, DO  fluticasone (FLONASE) 50 MCG/ACT nasal spray Place 1-2 sprays into both nostrils daily as needed.  04/28/19   [provider]  furosemide (LASIX) 20 MG tablet TAKE 0.5 TABLETS (10 MG TOTAL) BY MOUTH DAILY AS NEEDED. 09/22/19   [name], MD  loperamide (IMODIUM) 2 MG capsule Take 1 capsule (2 mg total) by mouth 4 (four) times daily as needed for diarrhea or loose stools. 03/07/16   Horton, Barbette Hair, MD  meclizine (ANTIVERT) 25 MG tablet Take 1 tablet (25 mg total) by mouth 3 (three) times daily as needed for dizziness. 09/15/19   [name], MD  Menthol, Topical Analgesic, (ICY HOT EX) Apply 1 application topically daily as needed (pain).    [provider]  Misc. Devices (ROLLER [place]) MISC Pt requires a roll-aider walker 09/18/19   [name], MD  Misc. Devices MISC Rollaider walker 09/16/19   [name], MD  Multiple Vitamins-Minerals (CENTRUM SILVER ULTRA WOMENS PO) Take by mouth daily.    [provider]  Omega-3 Fatty Acids (FISH OIL) 1000 MG CAPS Take  1,000 mg by mouth 2 (two) times daily.     [provider]  Oxycodone HCl 10 MG TABS Take 1 tablet (10 mg total) by mouth every 6 (six) hours as needed. 09/15/19   [name], MD  OXYGEN Inhale 2 L into the lungs daily.    [provider]  polyethylene glycol (MIRALAX / GLYCOLAX) 17 g packet Take 17 g by mouth daily. 08/22/19   Manuella Ghazi, Pratik D, DO  promethazine (PHENERGAN) 25 MG tablet Take 12.5-25 mg by mouth every 6 (six) hours as needed. 08/06/19   [provider]  propranolol (INDERAL) 10 MG tablet TAKE 1 TABLET BY MOUTH TWICE A DAY 03/06/18   Cassandria Anger, MD  senna (SENOKOT) 8.6 MG TABS tablet Take 1 tablet (8.6 mg total) by mouth at bedtime as needed for mild constipation. 08/21/19   Manuella Ghazi, Pratik D, DO  sodium chloride 0.9 % SOLN 50 mL with pembrolizumab 100 MG/4ML SOLN 2 mg/kg Inject 200 mg into the vein every 21 ( twenty-one) days. 09/23/19   [provider]  tiotropium (SPIRIVA HANDIHALER) 18 MCG inhalation capsule Place  18 mcg into inhaler and inhale every evening.     [provider]    Physical Exam: Vitals:   09/23/19 1350 09/23/19 1400 09/23/19 1430 09/23/19 1500  BP:  109/64 (!) 114/59 (!) 107/55  Pulse:  91 91 89  Resp:  16 14 15   Temp: (!) 97.5 F (36.4 C)     TempSrc: Oral     SpO2:  99% 96% 99%  Weight:      Height:        Constitutional: Chronically ill-appearing calm, comfortable Vitals:   09/23/19 1350 09/23/19 1400 09/23/19 1430 09/23/19 1500  BP:  109/64 (!) 114/59 (!) 107/55  Pulse:  91 91 89  Resp:  16 14 15   Temp: (!) 97.5 F (36.4 C)     TempSrc: Oral     SpO2:  99% 96% 99%  Weight:      Height:       Eyes: PERRL, lids and conjunctivae normal ENMT: Mucous membranes are dry  Neck: normal, supple, no masses, no thyromegaly Respiratory: Normal respiratory effort. No accessory muscle use.  Cardiovascular: Regular rate and rhythm, no murmurs / rubs / gallops.  1+ extremity edema to mid leg right greater than left, bilateral feet cool to touch-chronic. Abdomen: no tenderness, no masses palpated. No hepatosplenomegaly.  Musculoskeletal: no clubbing / cyanosis. No joint deformity upper and lower extremities. Good ROM, no contractures. Normal muscle tone.  Skin: no rashes, lesions, ulcers. No induration Neurologic: No apparent cranial nerve abnormality, moving all extremities spontaneously. Psychiatric: Normal judgment and insight. Alert and oriented x 3. Normal mood.   Labs on Admission: I have personally reviewed following labs and imaging studies  CBC: Recent Labs  Lab 09/23/19 1023 09/23/19 1244  WBC 26.0* 24.2*  NEUTROABS 22.8* 21.3*  HGB 12.9 12.7  HCT 43.2 42.2  MCV 84.0 83.6  PLT 140* 349*   Basic Metabolic Panel: Recent Labs  Lab 09/23/19 1023 09/23/19 1244  NA 128* 128*  K 5.9* 6.0*  CL 90* 93*  CO2 21* 19*  GLUCOSE 107* 91  BUN 93* 91*  CREATININE 1.51* 1.46*  CALCIUM 8.1* 8.0*   Liver Function Tests: Recent Labs  Lab 09/23/19 1023  09/23/19 1244  AST 178* 177*  ALT 83* 80*  ALKPHOS 673* 639*  BILITOT 2.0* 2.2*  PROT 5.6* 5.3*  ALBUMIN 2.4* 2.3*   Cardiac  Enzymes: Recent Labs  Lab 09/23/19 1244  CKTOTAL 133   Thyroid Function Tests: Recent Labs    09/23/19 1019 09/23/19 1023  TSH  --  5.195*  FREET4 0.98  --     Radiological Exams on Admission: CT ABDOMEN PELVIS W CONTRAST  Result Date: 09/23/2019 CLINICAL DATA:  Lung carcinoma with known liver metastasis with right upper quadrant pain EXAM: CT ABDOMEN AND PELVIS WITH CONTRAST TECHNIQUE: Multidetector CT imaging of the abdomen and pelvis was performed using the standard protocol following bolus administration of intravenous contrast. CONTRAST:  54m OMNIPAQUE IOHEXOL 300 MG/ML  SOLN COMPARISON:  08/19/2019 FINDINGS: Lower chest: Lung bases are free of acute infiltrate or sizable effusion. Hepatobiliary: Liver demonstrates innumerable large centrally hypodense lesions which have increased in the interval from the prior exam in both size and number consistent with progression of metastatic disease. Index lesion in segment 8 now measures 4.0 x 3.3 cm and previously measured 2.2 x 2.8 cm. Segment 2 index lesion previously measured 2 cm in greatest dimension and now measures 3.3 cm in greatest dimension. Segment 7 index lesion previously measured 5.1 cm in greatest dimension and now measures at least 5.9 cm in greatest dimension and appears more confluent with smaller adjacent lesions. Segment 3 index lesion that measured 2.1 cm now measures approximately 3.9 cm in greatest dimension. 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 There also changes of tumor burden extending into the confluence of the inferior vena cava and right atrium. Gallbladder has been surgically removed. Pancreas:  Unremarkable. No pancreatic ductal dilatation or surrounding inflammatory changes. Spleen: Normal in size without focal abnormality. Adrenals/Urinary Tract: Right adrenal gland again demonstrates mild nodularity. The left adrenal gland is enlarged with a partially calcified partially fatty mass lesion measuring 3.1 x 3.1 cm. 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 The kidneys show no normal enhancement bilaterally with normal excretion of contrast material. No renal calculi or obstructive changes are seen. The bladder is well distended. Stomach/Bowel: Diverticular change of the colon is noted without evidence of diverticulitis. The colon shows no significant obstructive or inflammatory changes. The appendix has been surgically removed. No small bowel or gastric abnormality is noted. Vascular/Lymphatic: Atherosclerotic calcifications of the abdominal aorta are noted. No aneurysmal dilatation is seen. No definitive lymphadenopathy is noted. Reproductive: Uterus and bilateral adnexa are unremarkable. Surgical clips are noted which are likely related to prior tubal ligation bilaterally. Other: Mild free fluid is noted within the pelvis new from the prior exam. No herniation is identified. Musculoskeletal: Degenerative changes of lumbar spine are noted. No definitive lytic or sclerotic lesions are seen. No compression deformities are noted. IMPRESSION: Significant increase in both size and number of hepatic metastatic lesions when compared with the recent exam from one month previous. Additionally, there is now further ingrowth of tumor into portal venous branches as well as into the hepatic venous branches with areas of tumor extending into the confluence of the IVC and right atrium. New mild ascites is noted as well. Diverticulosis without diverticulitis. Stable adrenal lesions bilaterally. Electronically Signed   By: MInez CatalinaM.D.   On: 09/23/2019  15:44   DG Chest Portable 1 View  Result Date: 09/23/2019 CLINICAL DATA:  Weakness, history of metastatic lung cancer. EXAM: PORTABLE CHEST 1 VIEW  COMPARISON:  August 19, 2019. FINDINGS: The heart size and mediastinal contours are within normal limits. No pneumothorax or pleural effusion is noted. Stable large right midlung mass is noted consistent with malignancy. Left lung is clear. The visualized skeletal structures are unremarkable. IMPRESSION: Stable large right midlung mass consistent with malignancy. No significant change compared to prior exam. Electronically Signed   By: Marijo Conception M.D.   On: 09/23/2019 12:44    EKG: Independently reviewed.  EKG showing sinus rhythm, with QTC within normal limits, artifacts present in both EKGs otherwise no significant ST or T wave changes compared to prior EKG.  Assessment/Plan Principal Problem:   AKI (acute kidney injury) ([place]) Active Problems:   COVID-19 virus infection   Essential hypertension, benign   Crohn's colitis ([place])   Adenocarcinoma of lung, stage 4, right (H[facility])   Hyponatremia   Acute kidney injury-likely prerenal, chronic poor p.o. intake, in the setting of Lasix use. Creatinine elevated 1.46, baseline 0.4, with marked elevated BUN at 91.  Appears dehydrated clinically.  CK normal. - 1 L  bolus given, continue  N/s 100cc/hr x 1 day - BMP a.m  COVID-19 virus infection-incidental finding.  Viral infection likely contributing to generalized weakness, but otherwise she is mostly asymptomatic.  Chest x-ray with stable large lung mass.  Patient has not gotten vaccinated, neither has any family members who have been in contact with her. -COVID-19 admission protocol -Obtain inflammatory markers follow daily -Will give a dose of dexamethasone 6 mg x 1 -Airborne and contact precautions.  Electrolyte abnormality, hyponatremia, hyperkalemia-sodium 128, potassium 6.  Hyponatremia likely from poor p.o. intake, hyponatremia likely due to  acute kidney injury. -Hydrate -Monitor electrolytes, closely -Calcium gluconate, D50 and 6 unit novolog given in the ED - Lokelma 30m x 1  Leukocytosis-24.  Up from  ~14 about a month ago.  Then and at this time no focus of infection identified.  Portable chest x-ray stable large lung mass.  ?? Likely reactive from burden of metastatic disease. -Follow-up UA -CBC a.m.  Elevated liver enzymes-AST 178, ALT 83, ALP 673, total bilirubin 2.  Likely due to increasing number and size of hepatic metastatic disease. -CMP a.m., hydrate.  Metastatic lung cancer-stage IV adenocarcinoma.  Follows with Dr. KDelton Coombes  Initial plans to start KMadeira Beachtoday-deferred for now due to abnormal blood work.  COPD history with chronic respiratory failure-on 2 L O2.  Respiratory status stable and unchanged despite COVID-19 infection. -Continue supplemental O2 -As needed albuterol inhaler, Flonase.  History of Crohn's-stable.   History of hyperthyroidism- TSH 5.195. -T3, T4 ordered in ED, follow-up as outpatient.   DVT prophylaxis: Heparin  code Status: Full code, confirmed with patient and daughter-in-law JJanett Billowat bedside. Family Communication: Daughter in law-Jessica, who is pPharmacist, hospital present at bedside. Disposition Plan: 1 - 2 days, pending improvement in renal function and electrolytes. Consults called: None. Admission status: Observation, telemetry.   EBethena RoysMD [facility]  09/23/2019, 6:19 PM

## 2019-09-23 NOTE — Patient Instructions (Signed)
[place] at [facility] Discharge Instructions  You were seen today by Dr. Delton Coombes. He went over your recent lab results. Due to your lab results you will not be able to get your treatment. Please try to increase your protein intake as well as fluid intake.    Thank you for choosing [place] at [facility] to provide your oncology and hematology care.  To afford each patient quality time with our provider, please arrive at least 15 minutes before your scheduled appointment time.   If you have a lab appointment with the [facility] please come in thru the  Main Entrance and check in at the main information desk  You need to re-schedule your appointment should you arrive 10 or more minutes late.  We strive to give you quality time with our providers, and arriving late affects you and other patients whose appointments are after yours.  Also, if you no show three or more times for appointments you may be dismissed from the clinic at the providers discretion.     Again, thank you for choosing [facility].  Our hope is that these requests will decrease the amount of time that you wait before being seen by our physicians.       _____________________________________________________________  Should you have questions after your visit to [facility], please contact our office at [phone] between the hours of 8:00 a.m. and 4:30 p.m.  Voicemails left after 4:00 p.m. will not be returned until the following business day.  For prescription refill requests, have your pharmacy contact our office and allow 72 hours.    Cancer Center Support Programs:   > Cancer Support Group  2nd Tuesday of the month 1pm-2pm, Journey Room

## 2019-09-23 NOTE — ED Triage Notes (Addendum)
Pt brought from [place] for abnormal labs per Dr. Raliegh Ip. Pt starting treatment today with Keytruda, but unable to start treatment due to labs. Hx of metastatic lung cancer with liver involvement.  Pt cc/o lethargy and weakness.

## 2019-09-23 NOTE — Patient Instructions (Signed)
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  You will be treated every 3 weeks with an immunotherapy medication called pembrolizumab Beryle Flock).  The intent of treatment is to help control your cancer, prevent it from spreading further, and to alleviate any symptoms you may be having related to your disease.  You will see the doctor regularly throughout treatment.  We will obtain blood work from you prior to every treatment and monitor your results to make sure it is safe to give your treatment. The doctor monitors your response to treatment by the way you are feeling, your blood work, and by obtaining scans periodically.  There will be wait times while you are here for treatment.  It will take about 30 minutes to 1 hour for your lab work to result.  Then there will be wait times while pharmacy mixes your medications.    Pembrolizumab Beryle Flock)   About This Drug Pembrolizumab is used to treat cancer. It is given in the vein (IV).  It takes 30 minutes to infuse.  Possible Side Effects  . Nausea . Diarrhea (loose bowel movements) . Constipation (not able to move bowels) . Pain in your abdomen . Tiredness . Fever . Decreased appetite (decreased hunger) . Muscle and bone pain . Trouble breathing . Cough . Rash . Itching  Note: Each of the side effects above was reported in 20% or greater of patients treated with pembrolizumab. Your side effects may be different if you are receiving pembrolizumab in combination with another chemotherapy agent. Not all possible side effects are included above.  Warnings and Precautions  . This drug works with your immune system and can cause inflammation in any of your organs and tissues and can change how they work. This may put you at risk for developing serious medical problems, which can be life-threatening.  . Inflammation in the colon (colitis), which can be  life-threatening - symptoms are loose bowel movements (diarrhea) stomach cramping, and sometimes blood in the bowel movements  . Changes in liver function  . Changes in kidney function  . Inflammation (swelling) of the lungs, which can be life-threatening. You may have a dry cough or trouble breathing.  . This drug may affect some of your hormone glands (especially the thyroid, adrenals, pituitary and pancreas).  . Blood sugar levels may change, and you may develop diabetes. If you already have diabetes, changes may need to be made to your diabetes medication.  . Severe allergic skin reaction, which can be life-threatening. You may develop blisters on your skin that are filled with fluid or a severe red rash all over your body that may be painful.  . Increased risk of organ rejection in patients who have received donor organs  . Increased risk of complications in patients who will undergo a stem cell transplant after receiving pembrolizumab.  . While you are getting this drug in your vein (IV), you may have a reaction to the drug. Your nurse ill check you closely for these signs: fever or shaking chills, flushing, facial swelling, feeling dizzy, headache, trouble breathing, rash, itching, chest tightness, or chest pain. These reactions may occur after your infusion. If this happens, call 911 for emergency care.  Note: Some of the side effects above are very rare. If you have concerns and/or questions, please discuss them with your medical team.  Important Information . This drug may be present in the saliva, tears, sweat, urine, stool, vomit,  semen, and vaginal secretions. Talk to your doctor and/or your nurse about the necessary precautions to take during this time.  Treating Side Effects  . Drink plenty of fluids (a minimum of eight glasses per day is recommended).  . If you throw up or have loose bowel movements, you should drink more fluids so that you do not become dehydrated (lack  of water in the body from losing too much fluid).  . To help with nausea and vomiting, eat small, frequent meals instead of three large meals a day. Choose foods and drinks that are at room temperature. Ask your nurse or doctor about other helpful tips and medicine that is available to help stop or lessen these symptoms.  . If you have diarrhea, eat low-fiber foods that are high in protein and calories and avoid foods that can irritate your digestive tracts or lead to cramping.  . If you are not able to move your bowels, check with your doctor or nurse before you use any enemas, laxatives, or suppositories.  . Ask your doctor or nurse about medicines that are available to help stop or lessen constipation or diarrhea.  . To help with decreased appetite, eat small, frequent meals. Eat foods high in calories and protein, such as meat, poultry, fish, dry beans, tofu, eggs, nuts, milk, yogurt, cheese, ice cream, pudding, and nutritional supplements.  . Consider using sauces and spices to increase taste. Daily exercise, with your doctor's approval, may increase your appetite.  . Manage tiredness by pacing your activities for the day. Be sure to include periods of rest between energy-draining activities.  Marland Kitchen Keeping your pain under control is important to your wellbeing. Please tell your doctor or nurse if you are experiencing pain.  . If you have diabetes, keep good control of your blood sugar level. Tell your nurse or your doctor if your glucose levels are higher or lower than normal.  . If you get a rash do not put anything on it unless your doctor or nurse says you may. Keep the area around the rash clean and dry. Ask your doctor for medicine if your rash bothers you.  . Moisturize your skin several times a day  . Avoid sun exposure and apply sunscreen routinely when outdoors  . Infusion reactions may happen after your infusion. If this happens, call 911 for emergency care.   Food and Drug  Interactions  . There are no known interactions of pembrolizumab with food.  . This drug may interact with other medicines. Tell your doctor and pharmacist about all the prescription and over-the-counter medicines and dietary supplements (vitamins, minerals, herbs and others) that you are taking at this time. Also, check with your doctor or pharmacist before starting any new prescription or over-the-counter medicines, or dietary supplements to make sure that there are no interactions.  When to Call the Doctor  Call your doctor or nurse if you have any of the following symptoms and/or any new or unusual symptoms:  . Fever of 100.4 F (38 C) or higher  . Chills  . Headache that does not go away  . Tiredness that interferes with your daily activities  . Feeling dizzy or lightheaded  . Wheezing or trouble breathing  . Chest pain  . Dry cough  . Coughing up yellow, green or bloody mucus  . Nausea that stops you from eating or drinking, and/or that is not relieved by prescribed medicines  . Lasting loss of appetite or rapid weight loss of five  pounds in a week  . Diarrhea, 4 times in one day or diarrhea with lack of strength or a feeling of being dizzy  . Blood in your stool  . No bowel movement for 3 days or when you feel uncomfortable  . Extreme weakness that interferes with normal activities  . Decreased urine  . Abnormal blood sugar  . Unusual thirst, passing urine often, headache, sweating, shakiness, irritability  . A new rash and/or itching  . Rash that is not relieved by prescribed medicines  . Pain that does not go away or is not relieved by prescribed medicines, especially in the upper right abdomen  . Flu-like symptoms: fever, headache, muscle and joint aches, and fatigue (low energy, feeling weak)   . Signs of liver problems: dark urine, pale bowel movements, bad stomach pain, feeling very tired and weak, unusual itching, or yellowing of the eyes or  skin  . Signs of infusion reactions such as fever or shaking chills, flushing, facial swelling, feeling dizzy, headache, trouble breathing, rash, itching, chest tightness, or chest pain. If this happens, call 911 for emergency care.  . If you think you are pregnant  Reproduction Warnings  . Pregnancy warning: This drug may have harmful effects on the unborn baby. Women of childbearing potential should use effective methods of birth control during your cancer treatment and for at least 4 months after treatment. Let your doctor know right away if you think you may be pregnant.  . Breast feeding warning: It is not known if this drug passes into breast milk. For this reason, women should not breastfeed during treatment and for 4 months after treatment because this drug could enter the breast milk and cause harm to a breastfeeding baby.  . Fertility warning: Human fertility studies have not been done with this drug. Talk with your doctor or nurse if you plan to have children.   SELF CARE ACTIVITIES WHILE ON IMMUNOTHERAPY:  Hydration Increase your fluid intake 48 hours prior to treatment and drink at least 8 to 12 cups (64 ounces) of water/decaffeinated beverages per day after treatment. You can still have your cup of coffee or soda but these beverages do not count as part of your 8 to 12 cups that you need to drink daily. No alcohol intake.  Medications Continue taking your normal prescription medication as prescribed.  If you start any new herbal or new supplements please let us know first to make sure it is safe.  Mouth Care Have teeth cleaned professionally before starting treatment. Keep dentures and partial plates clean. Use soft toothbrush and do not use mouthwashes that contain alcohol. Biotene is a good mouthwash that is available at most pharmacies or may be ordered by calling [phone]. Use warm salt water gargles (1 teaspoon salt per 1 quart warm water) before and after meals and  at bedtime. Or you may rinse with 2 tablespoons of three-percent hydrogen peroxide mixed in eight ounces of water. If you are still having problems with your mouth or sores in your mouth please call the clinic. If you need dental work, please let the doctor know before you go for your appointment so that we can coordinate the best possible time for you in regards to your chemo regimen. You need to also let your dentist know that you are actively taking chemo. We may need to do labs prior to your dental appointment.  Skin Care Always use sunscreen that has not expired and with SPF Nancy Fetter Protection Factor)  of 50 or higher. Wear hats to protect your head from the sun. Remember to use sunscreen on your hands, ears, face, & feet.  Use good moisturizing lotions such as udder cream, eucerin, or even Vaseline. Some chemotherapies can cause dry skin, color changes in your skin and nails.    . Avoid long, hot showers or baths. . Use gentle, fragrance-free soaps and laundry detergent. . Use moisturizers, preferably creams or ointments rather than lotions because the thicker consistency is better at preventing skin dehydration. Apply the cream or ointment within 15 minutes of showering. Reapply moisturizer at night, and moisturize your hands every time after you wash them.   Infection Prevention Please wash your hands for at least 30 seconds using warm soapy water. Handwashing is the #1 way to prevent the spread of germs. Stay away from sick people or people who are getting over a cold. If you develop respiratory systems such as green/yellow mucus production or productive cough or persistent cough let us know and we will see if you need an antibiotic. It is a good idea to keep a pair of gloves on when going into grocery stores/[employer] to decrease your risk of coming into contact with germs on the carts, etc. Carry alcohol hand gel with you at all times and use it frequently if out in public. If your temperature  reaches 100.5 or higher please call the clinic and let us know.  If it is after hours or on the weekend please go to the ER if your temperature is over 100.4.  Please have your own personal thermometer at home to use.    Sex and bodily fluids If you are going to have sex, a condom must be used to protect the person that isn't taking immunotherapy. For a few days after treatment, immunotherapy can be excreted through your bodily fluids.  When using the toilet please close the lid and flush the toilet twice.  Do this for a few day after you have had immunotherapy.   Contraception It is not known for sure whether or not immunotherapy drugs can be passed on through semen or secretions from the vagina. Because of this some doctors advise people to use a barrier method if you have sex during treatment. This applies to vaginal, anal or oral sex.  Generally, doctors advise a barrier method only for the time you are actually having the treatment and for about a week after your treatment.  Advice like this can be worrying, but this does not mean that you have to avoid being intimate with your partner. You can still have close contact with your partner and continue to enjoy sex.  Animals If you have cats or birds we just ask that you not change the litter or change the cage.  Please have someone else do this for you while you are on immunotherapy.   Food Safety During and After Cancer Treatment Food safety is important for people both during and after cancer treatment. Cancer and cancer treatments, such as chemotherapy, radiation therapy, and stem cell/bone marrow transplantation, often weaken the immune system. This makes it harder for your body to protect itself from foodborne illness, also called food poisoning. Foodborne illness is caused by eating food that contains harmful bacteria, parasites, or viruses.  Foods to avoid Some foods have a higher risk of becoming tainted with bacteria. These  include: Marland Kitchen Unwashed fresh fruit and vegetables, especially leafy vegetables that can hide dirt and other contaminants  Raw sprouts, such  as alfalfa sprouts . Raw or undercooked beef, especially ground beef, or other raw or undercooked meat and poultry . Fatty, fried, or spicy foods immediately before or after treatment.  These can sit heavy on your stomach and make you feel nauseous. . Raw or undercooked shellfish, such as oysters. . Sushi and sashimi, which often contain raw fish.  . Unpasteurized beverages, such as unpasteurized fruit juices, raw milk, raw yogurt, or cider . Undercooked eggs, such as soft boiled, over easy, and poached; raw, unpasteurized eggs; or foods made with raw egg, such as homemade raw cookie dough and homemade mayonnaise  Simple steps for food safety  Shop smart. . Do not buy food stored or displayed in an unclean area. . Do not buy bruised or damaged fruits or vegetables. . Do not buy cans that have cracks, dents, or bulges. . Pick up foods that can spoil at the end of your shopping trip and store them in a cooler on the way home.  Prepare and clean up foods carefully. . Rinse all fresh fruits and vegetables under running water, and dry them with a clean towel or paper towel. . Clean the top of cans before opening them. . After preparing food, wash your hands for 20 seconds with hot water and soap. Pay special attention to areas between fingers and under nails. . Clean your utensils and dishes with hot water and soap. Marland Kitchen Disinfect your kitchen and cutting boards using 1 teaspoon of liquid, unscented bleach mixed into 1 quart of water.    Dispose of old food. . Eat canned and packaged food before its expiration date (the "use by" or "best before" date). . Consume refrigerated leftovers within 3 to 4 days. After that time, throw out the food. Even if the food does not smell or look spoiled, it still may be unsafe. Some bacteria, such as Listeria, can grow even on  foods stored in the refrigerator if they are kept for too long.  Take precautions when eating out. . At restaurants, avoid buffets and salad bars where food sits out for a long time and comes in contact with many people. Food can become contaminated when someone with a virus, often a norovirus, or another "bug" handles it. . Put any leftover food in a "to-go" container yourself, rather than having the server do it. And, refrigerate leftovers as soon as you get home. . Choose restaurants that are clean and that are willing to prepare your food as you order it cooked.    SYMPTOMS TO REPORT AS SOON AS POSSIBLE AFTER TREATMENT:   FEVER GREATER THAN 100.4 F  CHILLS WITH OR WITHOUT FEVER  NAUSEA AND VOMITING THAT IS NOT CONTROLLED WITH YOUR NAUSEA MEDICATION  UNUSUAL SHORTNESS OF BREATH  UNUSUAL BRUISING OR BLEEDING  TENDERNESS IN MOUTH AND THROAT WITH OR WITHOUT PRESENCE OF ULCERS  URINARY PROBLEMS  BOWEL PROBLEMS  UNUSUAL RASH     Wear comfortable clothing and clothing appropriate for easy access to any Portacath or PICC line. Let us know if there is anything that we can do to make your therapy better!   What to do if you need assistance after hours or on the weekends: CALL [phone].  HOLD on the line, do not hang up.  You will hear multiple messages but at the end you will be connected with a nurse triage line.  They will contact the doctor if necessary.  Most of the time they will be able to assist you.  Do  not call the hospital operator.     I have been informed and understand all of the instructions given to me and have received a copy. I have been instructed to call the clinic [phone] or my family physician as soon as possible for continued medical care, if indicated. I do not have any more questions at this time but understand that I may call the [place] or the Patient Navigator at [phone] during office hours should I have questions or need assistance  in obtaining follow-up care.

## 2019-09-23 NOTE — Assessment & Plan Note (Addendum)
1. 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 Foundation 1 with no targetable mutations. -CT CAP on 2019-08-19 showed 3.8 cm spiculated right upper lobe lung mass, hilar and paratracheal lymph nodes, innumerable liver mets. Brain MRI was negative. -Based on her performance status we have recommended single agent Keytruda. -Today she feels very weak. Also has right upper quadrant pain. -We reviewed her labs. LFTs are elevated. She also has elevated creatinine. White count is elevated at 24. -I have recommended to hold off on starting her immunotherapy today. I will talk to the ER attending and send her to the ER immediately for further evaluation.  2. Right upper quadrant pain: -She is taking OxyContin 10 mg twice daily and oxycodone 10 mg 1 tablet in between which is helping.  3. Difficulty ambulation: -She has difficult ambulation from leg swellings. She also has low albumin.  4. Lower extremity swelling: -She is a vegetarian and has a low albumin. She met with our dietitian. Will consider checking a B12 level. -She has increased Lasix to 20 mg daily. This is probably caused her creatinine to go up along with hyperkalemia.

## 2019-09-24 ENCOUNTER — Telehealth: Payer: Medicare Other | Admitting: "Endocrinology

## 2019-09-24 ENCOUNTER — Encounter (HOSPITAL_COMMUNITY): Payer: Self-pay | Admitting: Hematology

## 2019-09-24 DIAGNOSIS — I10 Essential (primary) hypertension: Secondary | ICD-10-CM | POA: Diagnosis not present

## 2019-09-24 DIAGNOSIS — Z9981 Dependence on supplemental oxygen: Secondary | ICD-10-CM | POA: Diagnosis not present

## 2019-09-24 DIAGNOSIS — N179 Acute kidney failure, unspecified: Secondary | ICD-10-CM | POA: Diagnosis present

## 2019-09-24 DIAGNOSIS — H9190 Unspecified hearing loss, unspecified ear: Secondary | ICD-10-CM | POA: Diagnosis present

## 2019-09-24 DIAGNOSIS — E860 Dehydration: Secondary | ICD-10-CM | POA: Diagnosis present

## 2019-09-24 DIAGNOSIS — R7401 Elevation of levels of liver transaminase levels: Secondary | ICD-10-CM | POA: Diagnosis not present

## 2019-09-24 DIAGNOSIS — E440 Moderate protein-calorie malnutrition: Secondary | ICD-10-CM | POA: Diagnosis present

## 2019-09-24 DIAGNOSIS — C3491 Malignant neoplasm of unspecified part of right bronchus or lung: Secondary | ICD-10-CM | POA: Diagnosis present

## 2019-09-24 DIAGNOSIS — N189 Chronic kidney disease, unspecified: Secondary | ICD-10-CM | POA: Diagnosis present

## 2019-09-24 DIAGNOSIS — E875 Hyperkalemia: Secondary | ICD-10-CM

## 2019-09-24 DIAGNOSIS — U071 COVID-19: Secondary | ICD-10-CM

## 2019-09-24 DIAGNOSIS — J9611 Chronic respiratory failure with hypoxia: Secondary | ICD-10-CM | POA: Diagnosis present

## 2019-09-24 DIAGNOSIS — E039 Hypothyroidism, unspecified: Secondary | ICD-10-CM | POA: Diagnosis present

## 2019-09-24 DIAGNOSIS — C787 Secondary malignant neoplasm of liver and intrahepatic bile duct: Secondary | ICD-10-CM | POA: Diagnosis present

## 2019-09-24 DIAGNOSIS — Z79899 Other long term (current) drug therapy: Secondary | ICD-10-CM | POA: Diagnosis not present

## 2019-09-24 DIAGNOSIS — Z8249 Family history of ischemic heart disease and other diseases of the circulatory system: Secondary | ICD-10-CM | POA: Diagnosis not present

## 2019-09-24 DIAGNOSIS — E871 Hypo-osmolality and hyponatremia: Secondary | ICD-10-CM

## 2019-09-24 DIAGNOSIS — M810 Age-related osteoporosis without current pathological fracture: Secondary | ICD-10-CM | POA: Diagnosis present

## 2019-09-24 DIAGNOSIS — I129 Hypertensive chronic kidney disease with stage 1 through stage 4 chronic kidney disease, or unspecified chronic kidney disease: Secondary | ICD-10-CM | POA: Diagnosis present

## 2019-09-24 DIAGNOSIS — Z87891 Personal history of nicotine dependence: Secondary | ICD-10-CM | POA: Diagnosis not present

## 2019-09-24 DIAGNOSIS — Z833 Family history of diabetes mellitus: Secondary | ICD-10-CM | POA: Diagnosis not present

## 2019-09-24 DIAGNOSIS — Z681 Body mass index (BMI) 19 or less, adult: Secondary | ICD-10-CM | POA: Diagnosis not present

## 2019-09-24 DIAGNOSIS — N1832 Chronic kidney disease, stage 3b: Secondary | ICD-10-CM | POA: Diagnosis present

## 2019-09-24 DIAGNOSIS — D72829 Elevated white blood cell count, unspecified: Secondary | ICD-10-CM | POA: Diagnosis present

## 2019-09-24 DIAGNOSIS — J449 Chronic obstructive pulmonary disease, unspecified: Secondary | ICD-10-CM | POA: Diagnosis present

## 2019-09-24 DIAGNOSIS — Z7951 Long term (current) use of inhaled steroids: Secondary | ICD-10-CM | POA: Diagnosis not present

## 2019-09-24 LAB — URINALYSIS, ROUTINE W REFLEX MICROSCOPIC
Bilirubin Urine: NEGATIVE
Glucose, UA: NEGATIVE mg/dL
Hgb urine dipstick: NEGATIVE
Ketones, ur: NEGATIVE mg/dL
Leukocytes,Ua: NEGATIVE
Nitrite: NEGATIVE
Protein, ur: NEGATIVE mg/dL
Specific Gravity, Urine: 1.032 — ABNORMAL HIGH (ref 1.005–1.030)
pH: 5 (ref 5.0–8.0)

## 2019-09-24 LAB — CBC WITH DIFFERENTIAL/PLATELET
Abs Immature Granulocytes: 0.34 10*3/uL — ABNORMAL HIGH (ref 0.00–0.07)
Basophils Absolute: 0 10*3/uL (ref 0.0–0.1)
Basophils Relative: 0 %
Eosinophils Absolute: 0 10*3/uL (ref 0.0–0.5)
Eosinophils Relative: 0 %
HCT: 39 % (ref 36.0–46.0)
Hemoglobin: 11.5 g/dL — ABNORMAL LOW (ref 12.0–15.0)
Immature Granulocytes: 2 %
Lymphocytes Relative: 6 %
Lymphs Abs: 1.1 10*3/uL (ref 0.7–4.0)
MCH: 24.9 pg — ABNORMAL LOW (ref 26.0–34.0)
MCHC: 29.5 g/dL — ABNORMAL LOW (ref 30.0–36.0)
MCV: 84.4 fL (ref 80.0–100.0)
Monocytes Absolute: 0.4 10*3/uL (ref 0.1–1.0)
Monocytes Relative: 2 %
Neutro Abs: 17.4 10*3/uL — ABNORMAL HIGH (ref 1.7–7.7)
Neutrophils Relative %: 90 %
Platelets: 141 10*3/uL — ABNORMAL LOW (ref 150–400)
RBC: 4.62 MIL/uL (ref 3.87–5.11)
RDW: 17.1 % — ABNORMAL HIGH (ref 11.5–15.5)
WBC: 19.2 10*3/uL — ABNORMAL HIGH (ref 4.0–10.5)
nRBC: 0 % (ref 0.0–0.2)

## 2019-09-24 LAB — COMPREHENSIVE METABOLIC PANEL
ALT: 72 U/L — ABNORMAL HIGH (ref 0–44)
AST: 160 U/L — ABNORMAL HIGH (ref 15–41)
Albumin: 2 g/dL — ABNORMAL LOW (ref 3.5–5.0)
Alkaline Phosphatase: 542 U/L — ABNORMAL HIGH (ref 38–126)
Anion gap: 16 — ABNORMAL HIGH (ref 5–15)
BUN: 79 mg/dL — ABNORMAL HIGH (ref 8–23)
CO2: 18 mmol/L — ABNORMAL LOW (ref 22–32)
Calcium: 7.7 mg/dL — ABNORMAL LOW (ref 8.9–10.3)
Chloride: 99 mmol/L (ref 98–111)
Creatinine, Ser: 1.19 mg/dL — ABNORMAL HIGH (ref 0.44–1.00)
GFR calc Af Amer: 54 mL/min — ABNORMAL LOW (ref >60)
GFR calc non Af Amer: 46 mL/min — ABNORMAL LOW (ref >60)
Glucose, Bld: 113 mg/dL — ABNORMAL HIGH (ref 70–99)
Potassium: 5.2 mmol/L — ABNORMAL HIGH (ref 3.5–5.1)
Sodium: 133 mmol/L — ABNORMAL LOW (ref 135–145)
Total Bilirubin: 1.7 mg/dL — ABNORMAL HIGH (ref 0.3–1.2)
Total Protein: 4.8 g/dL — ABNORMAL LOW (ref 6.5–8.1)

## 2019-09-24 LAB — D-DIMER, QUANTITATIVE: D-Dimer, Quant: >20 ug/mL-FEU — ABNORMAL HIGH (ref 0.00–0.50)

## 2019-09-24 LAB — C-REACTIVE PROTEIN: CRP: 20.6 mg/dL — ABNORMAL HIGH (ref <1.0)

## 2019-09-24 LAB — T4: T4, Total: 4.9 ug/dL (ref 4.5–12.0)

## 2019-09-24 LAB — FERRITIN: Ferritin: 2887 ng/mL — ABNORMAL HIGH (ref 11–307)

## 2019-09-24 MED ORDER — OXYCODONE HCL 5 MG PO TABS
5.0000 mg | ORAL_TABLET | Freq: Four times a day (QID) | ORAL | Status: DC | PRN
  Administered 2019-09-24: 5 mg via ORAL
  Administered 2019-09-24: 10 mg via ORAL
  Administered 2019-09-25: 5 mg via ORAL
  Administered 2019-09-25: 10 mg via ORAL
  Administered 2019-09-25: 5 mg via ORAL
  Filled 2019-09-24: qty 2
  Filled 2019-09-24 (×3): qty 1
  Filled 2019-09-24: qty 2

## 2019-09-24 MED ORDER — SODIUM BICARBONATE 650 MG PO TABS
650.0000 mg | ORAL_TABLET | Freq: Three times a day (TID) | ORAL | Status: AC
  Administered 2019-09-24 – 2019-09-25 (×3): 650 mg via ORAL
  Filled 2019-09-24 (×3): qty 1

## 2019-09-24 MED ORDER — SODIUM ZIRCONIUM CYCLOSILICATE 10 G PO PACK
10.0000 g | PACK | Freq: Once | ORAL | Status: AC
  Administered 2019-09-24: 10 g via ORAL
  Filled 2019-09-24: qty 1

## 2019-09-24 NOTE — Progress Notes (Addendum)
Notified on call physician of pt not voiding this shift. Bladder scan noted 700cc. Awaiting new orders  No new orders at this time. Pt not in any distress/alert and oriented. Will continue to monitor pt for output

## 2019-09-24 NOTE — Progress Notes (Signed)
PROGRESS NOTE    Stacey Nash  MVH:[id number] DOB: 19-Jun-1950 DOA: 09/23/2019 PCP: Stacey Squibb, MD    Chief Complaint  Patient presents with  . Abnormal Lab    Brief Narrative:  As per H&P written by Dr. Denton Nash on 09/23/2019 69 y.o. 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 Presented today to the cancer center to start chemotherapy with Keytruda, but this was deferred for today, due to abnormal labs.  Patient reported poor appetite, generalized weakness.  Blood work at the cancer center showed sodium of 128, potassium of 5.9, elevated creatinine, elevated liver enzymes.  She was subsequently referred to the ED. Patient's daughter in law Stacey Nash is at bedside and assist with the history.  Reports over the past several weeks patient has gradually become more weak, yesterday she was unable to stand up from the commode.  No falls.  Reports poor p.o. intake over the past several weeks.  No vomiting, no loose stools, reports chronic right flank pain.  Chronic unchanged difficulty breathing, no cough.  No chest pain.  No pain with urination, no urinary frequency.  Reports bilateral lower extremity swelling that has not improved despite daily Lasix use.  Recent hospitalization 3/23-3/25 for SIRs-tachycardia, fever and leukocytosis thought secondary to high burden of metastatic disease and primary lung cancer with dehydration.  Blood and urine cultures were unremarkable.  ED Course: Blood pressure systolic 10 3-1 1 4, temperature 97.5.  Blood work in the ED showed WBC 24.  Hyponatremia 128, potassium 6, serum bicarb of 19, anion gap of 16, BUN 91, creatinine of 1.46, AST of 177, ALT of 80, ALP 639, total bilirubin 2.2.  Tissues significant increase in both size and number of hepatic metastatic lesions, also further input of tumor into portal venous branches (please see detailed report).  Port  chest x-ray stable large lung mass without acute abnormality.  523ms bolus of fluids given, 1 g calcium gluconate given.  Hospitalist to admit for further evaluation and management.  Assessment & Plan: 1-[place] -Appears prerenal in nature in the setting of poor oral intake and continue diuretic usage (chronically on Lasix). -Continue holding nephrotoxic agents -Avoid hypotension -Continue IV fluids. -Renal function has improved with fluid resuscitation -Creatinine peaked at 1.53 on admission; down to 1.3 currently.  Baseline 0.4 -BUN 93 on presentation, currently 79. -Patient has been encouraged to maintain adequate nutrition/hydration.  2-hyperkalemia -In the setting of renal failure -Good response to the use of Lokelma -Continue telemetry monitoring -Will repeat 1 more dose of Lokelma -Still high borderline elevated K I will repeat blood work today -Repeat basic metabolic panel in a.m.  3-hyponatremia -In the setting of dehydration and poor oral intake along with the use of diuretics -Will continue IV fluids -Sodium improvement and up to 133 currently -Repeat basic metabolic panel in a.m.  4-leukocytosis -No focal bacterial infection appreciated -Appears to be reactive in the setting of demargination and hemoconcentration from dehydration. -Repeat CBC in a.m. -Continue IV fluids.  5-COPD with chronic respiratory failure -No wheezing on exam -Continue 2 L oxygen supplementation -Continue as needed bronchodilators and the use of Flonase.  6-positive Covid 19 -No significant respiratory symptoms appreciated -Patient is afebrile and with a stable inflammatory markers -Continue holding treatment for Covid at this time. -Continue symptomatic management as needed.  7-history of hypothyroidism -TSH 5.195 -Continue to follow T3 and T4 -Not on Synthroid prior to  admission.  8-transaminitis -From ongoing dehydration, acute phase reactant and ongoing  liver metastases -Continue to follow trend -Improvement appreciated on LFTs after IV fluids -Continue outpatient follow-up with oncology service.  9-metastatic lung cancer -Stage IV adenocarcinoma -Continue to follow-up with Stacey Nash -Plan is for initiation of Keytruda as an outpatient.  Active Problems:   Essential hypertension, benign   Crohn's colitis ([place])   Adenocarcinoma of lung, stage 4, right (HCC)   Hyponatremia   COVID-19 virus infection   Acute on chronic renal failure (HCC)      DVT prophylaxis: Heparin Code Status: Full code Family Communication: Daughter over the phone. Disposition:   Status is: Inpatient  Dispo: The patient is from: Home              Anticipated d/c is to: Home              Anticipated d/c date is: 09/25/2019              Patient currently not medically ready for discharge in the setting of decreased oral intake, still abnormal electrolytes and ongoing nausea.  Continue electrolytes repletion, follow specifically potassium trend, continue telemetry monitoring and IV fluids.  Follow renal function further improvement.  Hopefully discharge in the next 24 hours.   Consultants:   None   Procedures:   See below for x-ray reports.   Antimicrobials:  None   Subjective: Afebrile, hard of hearing, no vomiting.  Reports no shortness of breath, no chest pain.  Mild intermittent nausea and poor appetite reported.  Per nursing staff having some difficulty urinating and with some urine retention on bladder scan.  Objective: Vitals:   09/24/19 0018 09/24/19 0419 09/24/19 0805 09/24/19 1200  BP: 96/67 108/74 122/64 117/75  Pulse: 99 94 97 88  Resp: 17 18 18 18   Temp: 97.8 F (36.6 C) 97.9 F (36.6 C) (!) 97.5 F (36.4 C) 97.8 F (36.6 C)  TempSrc:   Oral Oral  SpO2: 100% 98% 97%   Weight:      Height:        Intake/Output Summary (Last 24 hours) at 09/24/2019 1613 Last data filed at 09/24/2019 1300 Gross per 24 hour  Intake  2111.51 ml  Output 660 ml  Net 1451.51 ml   Filed Weights   09/23/19 1209 09/23/19 2000  Weight: 47.7 kg 50.1 kg    Examination: General exam: Appears calm and comfortable, chronically ill and underweight.  Patient reports decreased appetite and mildly nausea feeling.  No chest pain, no fever, no vomiting, no feeling short of breath or complaining of abdominal pain/dysuria. Respiratory system: Further movement bilaterally, no wheezing, no crackles, positive scattered rhonchi.  No using accessory muscle.  Respiratory effort normal. Cardiovascular system: S1 & S2 heard, RRR. No JVD, murmurs, rubs, gallops or clicks. No pedal edema. Gastrointestinal system: Abdomen is nondistended, soft and nontender. No organomegaly or masses felt. Normal bowel sounds heard. Central nervous system: Alert and oriented. No focal neurological deficits. Extremities: Symmetric 4 x 5 power.  In the setting of poor effort and deconditioning. Skin: No rashes, no petechiae. Psychiatry: Judgement and insight appear normal. Mood & affect appropriate.    Data Reviewed: I have personally reviewed following labs and imaging studies  CBC: Recent Labs  Lab 09/23/19 1023 09/23/19 1244 09/24/19 0603  WBC 26.0* 24.2* 19.2*  NEUTROABS 22.8* 21.3* 17.4*  HGB 12.9 12.7 11.5*  HCT 43.2 42.2 39.0  MCV 84.0 83.6 84.4  PLT 140* 139* 141*  Basic Metabolic Panel: Recent Labs  Lab 09/23/19 1023 09/23/19 1244 09/23/19 2033 09/24/19 0603  NA 128* 128* 131* 133*  K 5.9* 6.0* 5.0 5.2*  CL 90* 93* 98 99  CO2 21* 19* 18* 18*  GLUCOSE 107* 91 98 113*  BUN 93* 91* 87* 79*  CREATININE 1.51* 1.46* 1.39* 1.19*  CALCIUM 8.1* 8.0* 7.7* 7.7*    GFR: Estimated Creatinine Clearance: 34.8 mL/min (A) (by C-G formula based on SCr of 1.19 mg/dL (H)).  Liver Function Tests: Recent Labs  Lab 09/23/19 1023 09/23/19 1244 09/24/19 0603  AST 178* 177* 160*  ALT 83* 80* 72*  ALKPHOS 673* 639* 542*  BILITOT 2.0* 2.2* 1.7*    PROT 5.6* 5.3* 4.8*  ALBUMIN 2.4* 2.3* 2.0*    CBG: Recent Labs  Lab 09/23/19 1738 09/23/19 2213  GLUCAP 70 75     Recent Results (from the past 240 hour(s))  Respiratory Panel by RT PCR (Flu A&B, Covid) - Nasopharyngeal Swab     Status: Abnormal   Collection Time: 09/23/19  4:21 PM   Specimen: Nasopharyngeal Swab  Result Value Ref Range Status   SARS Coronavirus 2 by RT PCR POSITIVE (A) NEGATIVE Final    Comment: RESULT CALLED TO, READ BACK BY AND VERIFIED WITH: ELLIS,K ON 09/23/19 AT 1750 BY LOY,C (NOTE) SARS-CoV-2 target nucleic acids are DETECTED. SARS-CoV-2 RNA is generally detectable in upper respiratory specimens  during the acute phase of infection. Positive results are indicative of the presence of the identified virus, but do not rule out bacterial infection or co-infection with other pathogens not detected by the test. Clinical correlation with patient history and other diagnostic information is necessary to determine patient infection status. The expected result is Negative. Fact Sheet for Patients:  PinkCheek.be Fact Sheet for Healthcare Providers: GravelBags.it This test is not yet approved or cleared by the [place] FDA and  has been authorized for detection and/or diagnosis of SARS-CoV-2 by FDA under an Emergency Use Authorization (EUA).  This EUA will remain in effect (meaning this test can be used) f or the duration of  the COVID-19 declaration under Section 564(b)(1) of the Act, 21 U.S.C. section 360bbb-3(b)(1), unless the authorization is terminated or revoked sooner.    Influenza A by PCR NEGATIVE NEGATIVE Final   Influenza B by PCR NEGATIVE NEGATIVE Final    Comment: (NOTE) The Xpert Xpress SARS-CoV-2/FLU/RSV assay is intended as an aid in  the diagnosis of influenza from Nasopharyngeal swab specimens and  should not be used as a sole basis for treatment. Nasal washings and  aspirates  are unacceptable for Xpert Xpress SARS-CoV-2/FLU/RSV  testing. Fact Sheet for Patients: PinkCheek.be Fact Sheet for Healthcare Providers: GravelBags.it This test is not yet approved or cleared by the [place] FDA and  has been authorized for detection and/or diagnosis of SARS-CoV-2 by  FDA under an Emergency Use Authorization (EUA). This EUA will remain  in effect (meaning this test can be used) for the duration of the  Covid-19 declaration under Section 564(b)(1) of the Act, 21  U.S.C. section 360bbb-3(b)(1), unless the authorization is  terminated or revoked. Performed at [facility], [address], [place], [place] [address]      Radiology Studies: CT ABDOMEN PELVIS W CONTRAST  Result Date: 09/23/2019 CLINICAL DATA:  Lung carcinoma with known liver metastasis with right upper quadrant pain EXAM: CT ABDOMEN AND PELVIS WITH CONTRAST TECHNIQUE: Multidetector CT imaging of the abdomen and pelvis was performed using the standard protocol following bolus  administration of intravenous contrast. CONTRAST:  26m OMNIPAQUE IOHEXOL 300 MG/ML  SOLN COMPARISON:  08/19/2019 FINDINGS: Lower chest: Lung bases are free of acute infiltrate or sizable effusion. Hepatobiliary: Liver demonstrates innumerable large centrally hypodense lesions which have increased in the interval from the prior exam in both size and number consistent with progression of metastatic disease. Index lesion in segment 8 now measures 4.0 x 3.3 cm and previously measured 2.2 x 2.8 cm. Segment 2 index lesion previously measured 2 cm in greatest dimension and now measures 3.3 cm in greatest dimension. Segment 7 index lesion previously measured 5.1 cm in greatest dimension and now measures at least 5.9 cm in greatest dimension and appears more confluent with smaller adjacent lesions. Segment 3 index lesion that measured 2.1 cm now measures approximately 3.9 cm in greatest  dimension. 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 There also changes of tumor burden extending into the confluence of the inferior vena cava and right atrium. Gallbladder has been surgically removed. Pancreas: Unremarkable. No pancreatic ductal dilatation or surrounding inflammatory changes. Spleen: Normal in size without focal abnormality. Adrenals/Urinary Tract: Right adrenal gland again demonstrates mild nodularity. The left adrenal gland is enlarged with a partially calcified partially fatty mass lesion measuring 3.1 x 3.1 cm. 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 The kidneys show no normal enhancement bilaterally with normal excretion of contrast material. No renal calculi or obstructive changes are seen. The bladder is well distended. Stomach/Bowel: Diverticular change of the colon is noted without evidence of diverticulitis. The colon shows no significant obstructive or inflammatory changes. The appendix has been surgically removed. No small bowel or gastric abnormality is noted. Vascular/Lymphatic: Atherosclerotic calcifications of the abdominal aorta are noted. No aneurysmal dilatation is seen. No definitive lymphadenopathy is noted. Reproductive: Uterus and bilateral adnexa are unremarkable. Surgical clips are noted which are likely related to prior tubal ligation bilaterally. Other: Mild free fluid is noted within the pelvis new from the prior exam. No herniation is identified. Musculoskeletal: Degenerative changes of lumbar spine are noted. No definitive lytic or sclerotic lesions are seen. No compression deformities are noted. IMPRESSION: Significant increase in both size and number of hepatic metastatic lesions when compared with the recent exam from  one month previous. Additionally, there is now further ingrowth of tumor into portal venous branches as well as into the hepatic venous branches with areas of tumor extending into the confluence of the IVC and right atrium. New mild ascites is noted as well. Diverticulosis without diverticulitis. Stable adrenal lesions bilaterally. Electronically Signed   By: MInez CatalinaM.D.   On: 09/23/2019 15:44   DG Chest Portable 1 View  Result Date: 09/23/2019 CLINICAL DATA:  Weakness, history of metastatic lung cancer. EXAM: PORTABLE CHEST 1 VIEW COMPARISON:  August 19, 2019. FINDINGS: The heart size and mediastinal contours are within normal limits. No pneumothorax or pleural effusion is noted. Stable large right midlung mass is noted consistent with malignancy. Left lung is clear. The visualized skeletal structures are unremarkable. IMPRESSION: Stable large right midlung mass consistent with malignancy. No significant change compared to prior exam. Electronically Signed   By: JMarijo ConceptionM.D.   On: 09/23/2019 12:44    Scheduled Meds: . feeding supplement (ENSURE ENLIVE)  237 mL Oral BID BM  .  heparin  5,000 Units Subcutaneous Q8H  . mouth rinse  15 mL Mouth Rinse BID  . sodium bicarbonate  650 mg Oral Q8H  . sodium zirconium cyclosilicate  10 g Oral Once  . umeclidinium bromide  1 puff Inhalation Daily   Continuous Infusions: . sodium chloride 100 mL/hr at 09/24/19 0851     LOS: 0 days    Time spent: 35 minutes     Barton Dubois, MD [facility]  To contact the attending provider between 7A-7P or the covering provider during after hours 7P-7A, please log into the web site [email or web address] and access using universal [place] password for that web site. If you do not have the password, please call the hospital operator.  09/24/2019, 4:13 PM

## 2019-09-24 NOTE — Care Management Obs Status (Signed)
[place] NOTIFICATION   Patient Details  Name: Stacey Nash MRN: [id number] Date of Birth: December 11, 1950   Medicare Observation Status Notification Given:  Yes(spoke with family member, placed letter on chart to go with dc paperwork as requested)    Tommy Medal 09/24/2019, 3:57 PM

## 2019-09-25 ENCOUNTER — Other Ambulatory Visit (HOSPITAL_COMMUNITY): Payer: Self-pay

## 2019-09-25 DIAGNOSIS — C3491 Malignant neoplasm of unspecified part of right bronchus or lung: Secondary | ICD-10-CM

## 2019-09-25 DIAGNOSIS — E875 Hyperkalemia: Secondary | ICD-10-CM

## 2019-09-25 DIAGNOSIS — R7401 Elevation of levels of liver transaminase levels: Secondary | ICD-10-CM

## 2019-09-25 DIAGNOSIS — N1832 Chronic kidney disease, stage 3b: Secondary | ICD-10-CM

## 2019-09-25 DIAGNOSIS — N179 Acute kidney failure, unspecified: Secondary | ICD-10-CM | POA: Diagnosis not present

## 2019-09-25 DIAGNOSIS — I10 Essential (primary) hypertension: Secondary | ICD-10-CM | POA: Diagnosis not present

## 2019-09-25 LAB — CBC WITH DIFFERENTIAL/PLATELET
Abs Immature Granulocytes: 0.35 10*3/uL — ABNORMAL HIGH (ref 0.00–0.07)
Basophils Absolute: 0 10*3/uL (ref 0.0–0.1)
Basophils Relative: 0 %
Eosinophils Absolute: 0 10*3/uL (ref 0.0–0.5)
Eosinophils Relative: 0 %
HCT: 38.9 % (ref 36.0–46.0)
Hemoglobin: 11.6 g/dL — ABNORMAL LOW (ref 12.0–15.0)
Immature Granulocytes: 1 %
Lymphocytes Relative: 5 %
Lymphs Abs: 1.3 10*3/uL (ref 0.7–4.0)
MCH: 24.9 pg — ABNORMAL LOW (ref 26.0–34.0)
MCHC: 29.8 g/dL — ABNORMAL LOW (ref 30.0–36.0)
MCV: 83.7 fL (ref 80.0–100.0)
Monocytes Absolute: 1.6 10*3/uL — ABNORMAL HIGH (ref 0.1–1.0)
Monocytes Relative: 6 %
Neutro Abs: 22.1 10*3/uL — ABNORMAL HIGH (ref 1.7–7.7)
Neutrophils Relative %: 88 %
Platelets: 136 10*3/uL — ABNORMAL LOW (ref 150–400)
RBC: 4.65 MIL/uL (ref 3.87–5.11)
RDW: 17.2 % — ABNORMAL HIGH (ref 11.5–15.5)
WBC: 25.4 10*3/uL — ABNORMAL HIGH (ref 4.0–10.5)
nRBC: 0 % (ref 0.0–0.2)

## 2019-09-25 LAB — COMPREHENSIVE METABOLIC PANEL
ALT: 81 U/L — ABNORMAL HIGH (ref 0–44)
AST: 175 U/L — ABNORMAL HIGH (ref 15–41)
Albumin: 2.1 g/dL — ABNORMAL LOW (ref 3.5–5.0)
Alkaline Phosphatase: 579 U/L — ABNORMAL HIGH (ref 38–126)
Anion gap: 14 (ref 5–15)
BUN: 71 mg/dL — ABNORMAL HIGH (ref 8–23)
CO2: 18 mmol/L — ABNORMAL LOW (ref 22–32)
Calcium: 7.4 mg/dL — ABNORMAL LOW (ref 8.9–10.3)
Chloride: 97 mmol/L — ABNORMAL LOW (ref 98–111)
Creatinine, Ser: 1.08 mg/dL — ABNORMAL HIGH (ref 0.44–1.00)
GFR calc Af Amer: >60 mL/min (ref >60)
GFR calc non Af Amer: 52 mL/min — ABNORMAL LOW (ref >60)
Glucose, Bld: 90 mg/dL (ref 70–99)
Potassium: 4.5 mmol/L (ref 3.5–5.1)
Sodium: 129 mmol/L — ABNORMAL LOW (ref 135–145)
Total Bilirubin: 2 mg/dL — ABNORMAL HIGH (ref 0.3–1.2)
Total Protein: 4.8 g/dL — ABNORMAL LOW (ref 6.5–8.1)

## 2019-09-25 LAB — C-REACTIVE PROTEIN: CRP: 15.9 mg/dL — ABNORMAL HIGH (ref <1.0)

## 2019-09-25 LAB — D-DIMER, QUANTITATIVE: D-Dimer, Quant: 16.88 ug/mL-FEU — ABNORMAL HIGH (ref 0.00–0.50)

## 2019-09-25 LAB — FERRITIN: Ferritin: 3396 ng/mL — ABNORMAL HIGH (ref 11–307)

## 2019-09-25 MED ORDER — MISC. DEVICES MISC: 0 refills | Status: AC

## 2019-09-25 MED ORDER — OXYCODONE HCL 10 MG PO TABS: 5.0000 mg | ORAL_TABLET | Freq: Four times a day (QID) | ORAL | Status: AC | PRN

## 2019-09-25 NOTE — Progress Notes (Signed)
IV and tele removed. D/C instructions reviewed with patient, verbalized understanding. To be transported to private vehicle via wheelchair, will continue to monitor.

## 2019-09-25 NOTE — Discharge Summary (Signed)
Physician Discharge Summary  Stacey Nash:[id number] DOB: 06-Jul-1950 DOA: 09/23/2019  PCP: Stacey Squibb, MD  Admit date: 09/23/2019 Discharge date: 09/25/2019  Time spent: 35 minutes  Recommendations for Outpatient Follow-up:  1. Repeat basic metabolic panel to evaluate lites and renal function 2. Repeat LFTs to follow liver function trend 3. Would recommend discussion for goals of cares and advance directive; long-term prognosis is guarded.   Discharge Diagnoses:  AKI (acute kidney injury) ([place]) Essential hypertension, benign Crohn's colitis ([place]) Adenocarcinoma of lung, stage 4, right ([place]) Hyponatremia COVID-19 virus infection Acute on chronic renal failure ([place]): Stage IIIb at baseline. Hyperkalemia Transaminitis Chronic Respiratory failure with hypoxia in the setting of COPD.    Discharge Condition: Stable and improved.  Discharged home with instructions to follow-up with oncology service and PCP as an outpatient.  CODE STATUS: Full code  Diet recommendation: Regular diet  Filed Weights   09/23/19 1209 09/23/19 2000  Weight: 47.7 kg 50.1 kg    History of present illness:  As per H&P written by Dr. Denton Nash on 09/23/2019 69 y.o.femalewith medical history significant formetastatic lung cancer, Crohn's disease, COPD with chronic respiratory failureon 2 L, hypertension. Presented today to the cancer center to start chemotherapy with Keytruda, but this was deferred for today, due to abnormal labs. Patient reported poor appetite, generalized weakness.Blood work at the cancer center showed sodium of 128, potassium of 5.9, elevated creatinine, elevated liver enzymes. She was subsequently referred to the ED. Patient's daughter in law Stacey Nash is at bedside and assist with the history. Reports over the past several weeks patient has gradually become more weak, yesterday she was unable to stand up from the commode. No falls. Reports poor p.o. intake over the past  several weeks. No vomiting, no loose stools,reports chronic right flank pain. Chronic unchanged difficulty breathing, no cough. No chest pain. No pain with urination, no urinary frequency. Reports bilateral lower extremity swelling that has not improved despite daily Lasix use.  Recent hospitalization 3/23-3/25 forSIRs-tachycardia, fever and leukocytosis thought secondary to high burden of metastatic disease and primary lung cancerwithdehydration.Blood and urine cultures were unremarkable.  ED Course:Blood pressure systolic 10 3-1 1 4, temperature 97.5. Blood work in the ED showed WBC 24. Hyponatremia 128, potassium 6, serum bicarb of 19, anion gap of 16, BUN91, creatinine of 1.46, AST of 177, ALT of 80, ALP 639, total bilirubin 2.2. Tissues significant increase in both size and number of hepatic metastatic lesions, also further input of tumor into portal venous branches (please see detailed report). Port chest x-ray stable large lung mass without acute abnormality.565msbolus of fluids given, 1 g calcium gluconate given.Hospitalist toadmit for further evaluation and management.   Hospital Course:  1-AKI (acute kidney injury) ([facility]: Based on patient's GFR/creatinine clearance qualifying to chronic kidney disease stage IIIb. -Appears prerenal in nature in the setting of poor oral intake and continue diuretic usage (chronically on Lasix prior to admission). -Continue holding nephrotoxic agents -Avoid hypotension -Continue adequate hydration -Renal function has improved with fluid resuscitation; at discharge creatinine level 1.08 -Creatinine peaked at 1.53 on admission; down to 1.3 currently.  Baseline around 0.4 -BUN 93 on presentation, currently 70. -Patient has been encouraged to maintain adequate nutrition/hydration.  2-hyperkalemia -In the setting of renal failure -Good response to the use of Lokelma -Continue to to monitor electrolytes trend at follow-up visit. -No  normalities appreciated on telemetry; patient potassium stabilized prior to discharge. -Adequate hydration encouraged.  3-hyponatremia -In the setting of dehydration and poor oral intake  along with the use of diuretics -Will continue IV fluids -Sodium improvement and up to 129-133 prior to discharge appreciated. -Repeat basic metabolic panel to follow electrolytes trend.  4-leukocytosis -No focal bacterial infection appreciated -Appears to be reactive in the setting of demargination and hemoconcentration from dehydration. -Repeat CBC to follow-up WBCs trend. -Continue adequate hydration.  5-COPD with chronic hypoxic respiratory failure -No wheezing on exam -Continue 2 L oxygen supplementation -Continue as needed bronchodilators and the use of Flonase.  6-positive Covid 19 -No significant respiratory symptoms appreciated -Patient is afebrile and with a stable inflammatory markers -Continue holding treatment for Covid at this time. -Continue symptomatic management as needed (as needed bronchodilators and mucolytic's).  7-history of hypothyroidism -TSH 5.195 -Continue to follow T3 and T4 -Not on Synthroid prior to admission. -We will recommend close monitoring of thyroid panel as an outpatient.  8-transaminitis -From ongoing dehydration, acute phase reactant and ongoing liver metastases -Continue to follow trend intermittently. -Improvement appreciated on LFTs after IV fluids -Continue outpatient follow-up with oncology service. -Patient advised to maintain adequate hydration.  9-metastatic lung cancer -Stage IV adenocarcinoma -Continue to follow-up with Stacey Nash -Plan is for initiation of Keytruda as an outpatient.  10-overall with guarded prognosis -Would recommend goals of care discussion as an outpatient and establishment of advance directive.    11-moderate protein calorie malnutrition -In the setting of decreased oral intake and underlying  malignancy. -BMI 19.1 -Patient encouraged to increase oral intake and to use feeding supplements.   Procedures:  See below for x-ray reports.  Consultations:  None  Discharge Exam: Vitals:   09/25/19 0800 09/25/19 1200  BP: 121/65 124/68  Pulse: 78 (!) 118  Resp: 17 18  Temp:    SpO2: 95% 96%   General exam: Appears calm and comfortable, chronically ill and underweight.  Patient reports improvement in her appetite and reports ability to keep down food and medications.  No chest pain and breathing at baseline with 2 L chronic oxygen supplementation. Respiratory system: no wheezing, no crackles, positive scattered rhonchi.  No using accessory muscle.  Respiratory effort normal. Cardiovascular system: S1 & S2 heard, RRR. No JVD, murmurs, rubs, gallops or clicks. No pedal edema. Gastrointestinal system: Abdomen is nondistended, soft and nontender. No organomegaly or masses felt. Normal bowel sounds heard. Central nervous system: Alert and oriented. No focal neurological deficits. Extremities: Symmetric 4 x 5 power.  In the setting of poor effort and deconditioning.  Per patient reports feels at baseline and is looking to go home today. Skin: No rashes, no petechiae. Psychiatry: Judgement and insight appear normal. Mood & affect appropriate.   Discharge Instructions   Discharge Instructions    Discharge instructions   Complete by: As directed    Maintain adequate hydration Continue good nutrition Follow-up with oncology service as previously scheduled Follow-up with PCP in 2 weeks. Take medications as prescribed.     Allergies as of 09/25/2019      Reactions   Sulfa Antibiotics Shortness Of Breath   Tetanus Toxoids Rash   Doxycycline Diarrhea   Statins    Leg pain and weakness   Zofran [ondansetron Hcl] Hives, Rash   Rash at injection site that spread immediately following administration        Medication List    STOP taking these medications   amLODipine 5 MG  tablet Commonly known as: NORVASC   furosemide 20 MG tablet Commonly known as: LASIX   propranolol 10 MG tablet Commonly known as: INDERAL  TAKE these medications   acetaminophen 500 MG tablet Commonly known as: TYLENOL Take 1,000 mg by mouth daily as needed for mild pain, moderate pain or headache.   albuterol (2.5 MG/3ML) 0.083% nebulizer solution Commonly known as: PROVENTIL Take 2.5 mg by nebulization every 6 (six) hours as needed for wheezing or shortness of breath.   albuterol 108 (90 Base) MCG/ACT inhaler Commonly known as: VENTOLIN HFA Inhale 2 puffs into the lungs every 6 (six) hours as needed for wheezing or shortness of breath.   CALCIUM 600 + D PO Take 1 tablet by mouth daily.   CENTRUM SILVER ULTRA WOMENS PO Take by mouth daily.   feeding supplement (ENSURE ENLIVE) Liqd Take 237 mLs by mouth 2 (two) times daily between meals.   Fish Oil 1000 MG Caps Take 1,000 mg by mouth 2 (two) times daily.   fluticasone 50 MCG/ACT nasal spray Commonly known as: FLONASE Place 1-2 sprays into both nostrils daily as needed.   Gaviscon 95-358 MG/15ML Susp Generic drug: aluminum hydroxide-magnesium carbonate Take 15 mLs by mouth 3 (three) times daily as needed for indigestion or heartburn.   ICY HOT EX Apply 1 application topically daily as needed (pain).   loperamide 2 MG capsule Commonly known as: IMODIUM Take 1 capsule (2 mg total) by mouth 4 (four) times daily as needed for diarrhea or loose stools.   meclizine 25 MG tablet Commonly known as: ANTIVERT Take 1 tablet (25 mg total) by mouth 3 (three) times daily as needed for dizziness.   Misc. Devices Misc Rollaider walker What changed: Another medication with the same name was added. Make sure you understand how and when to take each.   Roller Walker Misc Pt requires a roll-aider walker What changed: Another medication with the same name was added. Make sure you understand how and when to take each.    Misc. Devices Misc Rollaider Walker What changed: You were already taking a medication with the same name, and this prescription was added. Make sure you understand how and when to take each.   OxyCONTIN 10 mg 12 hr tablet Generic drug: oxyCODONE Take 10 mg by mouth 2 (two) times daily as needed. What changed: Another medication with the same name was changed. Make sure you understand how and when to take each.   Oxycodone HCl 10 MG Tabs Take 0.5-1 tablets (5-10 mg total) by mouth every 6 (six) hours as needed (pain). What changed:   how much to take  reasons to take this   OXYGEN Inhale 2 L into the lungs daily.   polyethylene glycol 17 g packet Commonly known as: MIRALAX / GLYCOLAX Take 17 g by mouth daily. What changed:   when to take this  reasons to take this   promethazine 25 MG tablet Commonly known as: PHENERGAN Take 12.5-25 mg by mouth every 6 (six) hours as needed.   senna 8.6 MG Tabs tablet Commonly known as: SENOKOT Take 1 tablet (8.6 mg total) by mouth at bedtime as needed for mild constipation.   sodium chloride 0.9 % SOLN 50 mL with pembrolizumab 100 MG/4ML SOLN 2 mg/kg Inject 200 mg into the vein every 21 ( twenty-one) days.   Spiriva HandiHaler 18 MCG inhalation capsule Generic drug: tiotropium Place 18 mcg into inhaler and inhale every evening.   Vitamin D3 25 MCG (1000 UT) Caps Take 2,000 Units by mouth daily.      Allergies  Allergen Reactions  . Sulfa Antibiotics Shortness Of Breath  . Tetanus Toxoids  Rash  . Doxycycline Diarrhea  . Statins     Leg pain and weakness  . Zofran [Ondansetron Hcl] Hives and Rash    Rash at injection site that spread immediately following administration     Follow-up Information    Stacey Squibb, MD. Schedule an appointment as soon as possible for a visit in 2 week(s).   Specialty: Internal Medicine Contact information: [place] [address] [phone]            The  results of significant diagnostics from this hospitalization (including imaging, microbiology, ancillary and laboratory) are listed below for reference.    Significant Diagnostic Studies: MR Brain W Wo Contrast  Result Date: 09/10/2019 CLINICAL DATA:  Stage IV metastatic lung carcinoma, adenocarcinoma. EXAM: MRI HEAD WITHOUT AND WITH CONTRAST TECHNIQUE: Multiplanar, multiecho pulse sequences of the brain and surrounding structures were obtained without and with intravenous contrast. CONTRAST:  26m GADAVIST GADOBUTROL 1 MMOL/ML IV SOLN COMPARISON:  None. FINDINGS: Brain: No acute infarct, acute hemorrhage or extra-axial collection. Mild white matter hyperintensity, most commonly due to chronic ischemic microangiopathy, though not unexpected for age. Normal volume of CSF spaces. No chronic microhemorrhage. Normal midline structures. There is no abnormal contrast enhancement. Vascular: Normal flow voids. Skull and upper cervical spine: Normal marrow signal. Sinuses/Orbits: Negative. Other: None. IMPRESSION: No findings of intracranial metastatic disease. Electronically Signed   By: KUlyses JarredM.D.   On: 09/10/2019 21:20   CT ABDOMEN PELVIS W CONTRAST  Result Date: 09/23/2019 CLINICAL DATA:  Lung carcinoma with known liver metastasis with right upper quadrant pain EXAM: CT ABDOMEN AND PELVIS WITH CONTRAST TECHNIQUE: Multidetector CT imaging of the abdomen and pelvis was performed using the standard protocol following bolus administration of intravenous contrast. CONTRAST:  725mOMNIPAQUE IOHEXOL 300 MG/ML  SOLN COMPARISON:  08/19/2019 FINDINGS: Lower chest: Lung bases are free of acute infiltrate or sizable effusion. Hepatobiliary: Liver demonstrates innumerable large centrally hypodense lesions which have increased in the interval from the prior exam in both size and number consistent with progression of metastatic disease. Index lesion in segment 8 now measures 4.0 x 3.3 cm and previously measured 2.2 x  2.8 cm. Segment 2 index lesion previously measured 2 cm in greatest dimension and now measures 3.3 cm in greatest dimension. Segment 7 index lesion previously measured 5.1 cm in greatest dimension and now measures at least 5.9 cm in greatest dimension and appears more confluent with smaller adjacent lesions. Segment 3 index lesion that measured 2.1 cm now measures approximately 3.9 cm in greatest dimension. 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 There also changes of tumor burden extending into the confluence of the inferior vena cava and right atrium. Gallbladder has been surgically removed. Pancreas: Unremarkable. No pancreatic ductal dilatation or surrounding inflammatory changes. Spleen: Normal in size without focal abnormality. Adrenals/Urinary Tract: Right adrenal gland again demonstrates mild nodularity. The left adrenal gland is enlarged with a partially calcified partially fatty mass lesion measuring 3.1 x 3.1 cm. 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 The kidneys show no normal enhancement bilaterally with normal excretion of contrast material. No renal calculi or obstructive changes are seen. The bladder is well distended. Stomach/Bowel: Diverticular change of the colon is noted without evidence of diverticulitis. The colon  shows no significant obstructive or inflammatory changes. The appendix has been surgically removed. No small bowel or gastric abnormality is noted. Vascular/Lymphatic: Atherosclerotic calcifications of the abdominal aorta are noted. No aneurysmal dilatation is seen. No definitive lymphadenopathy is noted. Reproductive: Uterus and bilateral adnexa are unremarkable. Surgical clips are noted which are likely related to prior tubal  ligation bilaterally. Other: Mild free fluid is noted within the pelvis new from the prior exam. No herniation is identified. Musculoskeletal: Degenerative changes of lumbar spine are noted. No definitive lytic or sclerotic lesions are seen. No compression deformities are noted. IMPRESSION: Significant increase in both size and number of hepatic metastatic lesions when compared with the recent exam from one month previous. Additionally, there is now further ingrowth of tumor into portal venous branches as well as into the hepatic venous branches with areas of tumor extending into the confluence of the IVC and right atrium. New mild ascites is noted as well. Diverticulosis without diverticulitis. Stable adrenal lesions bilaterally. Electronically Signed   By: Inez Catalina M.D.   On: 09/23/2019 15:44   DG Chest Portable 1 View  Result Date: 09/23/2019 CLINICAL DATA:  Weakness, history of metastatic lung cancer. EXAM: PORTABLE CHEST 1 VIEW COMPARISON:  August 19, 2019. FINDINGS: The heart size and mediastinal contours are within normal limits. No pneumothorax or pleural effusion is noted. Stable large right midlung mass is noted consistent with malignancy. Left lung is clear. The visualized skeletal structures are unremarkable. IMPRESSION: Stable large right midlung mass consistent with malignancy. No significant change compared to prior exam. Electronically Signed   By: Marijo Conception M.D.   On: 09/23/2019 12:44    Microbiology: Recent Results (from the past 240 hour(s))  Respiratory Panel by RT PCR (Flu A&B, Covid) - Nasopharyngeal Swab     Status: Abnormal   Collection Time: 09/23/19  4:21 PM   Specimen: Nasopharyngeal Swab  Result Value Ref Range Status   SARS Coronavirus 2 by RT PCR POSITIVE (A) NEGATIVE Final    Comment: RESULT CALLED TO, READ BACK BY AND VERIFIED WITH: ELLIS,K ON 09/23/19 AT 1750 BY LOY,C (NOTE) SARS-CoV-2 target nucleic acids are DETECTED. SARS-CoV-2 RNA is generally detectable  in upper respiratory specimens  during the acute phase of infection. Positive results are indicative of the presence of the identified virus, but do not rule out bacterial infection or co-infection with other pathogens not detected by the test. Clinical correlation with patient history and other diagnostic information is necessary to determine patient infection status. The expected result is Negative. Fact Sheet for Patients:  PinkCheek.be Fact Sheet for Healthcare Providers: GravelBags.it This test is not yet approved or cleared by the [place] FDA and  has been authorized for detection and/or diagnosis of SARS-CoV-2 by FDA under an Emergency Use Authorization (EUA).  This EUA will remain in effect (meaning this test can be used) f or the duration of  the COVID-19 declaration under Section 564(b)(1) of the Act, 21 U.S.C. section 360bbb-3(b)(1), unless the authorization is terminated or revoked sooner.    Influenza A by PCR NEGATIVE NEGATIVE Final   Influenza B by PCR NEGATIVE NEGATIVE Final    Comment: (NOTE) The Xpert Xpress SARS-CoV-2/FLU/RSV assay is intended as an aid in  the diagnosis of influenza from Nasopharyngeal swab specimens and  should not be used as a sole basis for treatment. Nasal washings and  aspirates are unacceptable for Xpert Xpress SARS-CoV-2/FLU/RSV  testing. Fact Sheet for Patients: PinkCheek.be Fact Sheet for Healthcare Providers: GravelBags.it  This test is not yet approved or cleared by the [place] and  has been authorized for detection and/or diagnosis of SARS-CoV-2 by  FDA under an Emergency Use Authorization (EUA). This EUA will remain  in effect (meaning this test can be used) for the duration of the  Covid-19 declaration under Section 564(b)(1) of the Act, 21  U.S.C. section 360bbb-3(b)(1), unless the authorization is   terminated or revoked. Performed at [facility], [address] [place], [place] [address]      Labs: Basic Metabolic Panel: Recent Labs  Lab 09/23/19 1023 09/23/19 1244 09/23/19 2033 09/24/19 0603 09/25/19 0630  NA 128* 128* 131* 133* 129*  K 5.9* 6.0* 5.0 5.2* 4.5  CL 90* 93* 98 99 97*  CO2 21* 19* 18* 18* 18*  GLUCOSE 107* 91 98 113* 90  BUN 93* 91* 87* 79* 71*  CREATININE 1.51* 1.46* 1.39* 1.19* 1.08*  CALCIUM 8.1* 8.0* 7.7* 7.7* 7.4*   Liver Function Tests: Recent Labs  Lab 09/23/19 1023 09/23/19 1244 09/24/19 0603 09/25/19 0630  AST 178* 177* 160* 175*  ALT 83* 80* 72* 81*  ALKPHOS 673* 639* 542* 579*  BILITOT 2.0* 2.2* 1.7* 2.0*  PROT 5.6* 5.3* 4.8* 4.8*  ALBUMIN 2.4* 2.3* 2.0* 2.1*   CBC: Recent Labs  Lab 09/23/19 1023 09/23/19 1244 09/24/19 0603 09/25/19 0630  WBC 26.0* 24.2* 19.2* 25.4*  NEUTROABS 22.8* 21.3* 17.4* 22.1*  HGB 12.9 12.7 11.5* 11.6*  HCT 43.2 42.2 39.0 38.9  MCV 84.0 83.6 84.4 83.7  PLT 140* 139* 141* 136*   Cardiac Enzymes: Recent Labs  Lab 09/23/19 1244  CKTOTAL 133   CBG: Recent Labs  Lab 09/23/19 1738 09/23/19 2213  GLUCAP 70 75   Signed:  Barton Dubois MD.  [facility] 09/25/2019, 3:40 PM

## 2019-09-25 NOTE — Plan of Care (Signed)

## 2019-09-26 ENCOUNTER — Inpatient Hospital Stay (HOSPITAL_COMMUNITY)
Admission: EM | Admit: 2019-09-26 | Discharge: 2019-10-28 | DRG: 682 | Disposition: E | Payer: Medicare Other | Attending: Internal Medicine | Admitting: Internal Medicine

## 2019-09-26 ENCOUNTER — Encounter (HOSPITAL_COMMUNITY): Payer: Self-pay | Admitting: *Deleted

## 2019-09-26 ENCOUNTER — Other Ambulatory Visit: Payer: Self-pay

## 2019-09-26 ENCOUNTER — Emergency Department (HOSPITAL_COMMUNITY): Payer: Medicare Other

## 2019-09-26 DIAGNOSIS — R54 Age-related physical debility: Secondary | ICD-10-CM | POA: Diagnosis present

## 2019-09-26 DIAGNOSIS — Z9049 Acquired absence of other specified parts of digestive tract: Secondary | ICD-10-CM

## 2019-09-26 DIAGNOSIS — Z515 Encounter for palliative care: Secondary | ICD-10-CM | POA: Diagnosis not present

## 2019-09-26 DIAGNOSIS — D72829 Elevated white blood cell count, unspecified: Secondary | ICD-10-CM | POA: Diagnosis present

## 2019-09-26 DIAGNOSIS — N390 Urinary tract infection, site not specified: Secondary | ICD-10-CM | POA: Diagnosis present

## 2019-09-26 DIAGNOSIS — M810 Age-related osteoporosis without current pathological fracture: Secondary | ICD-10-CM | POA: Diagnosis present

## 2019-09-26 DIAGNOSIS — N179 Acute kidney failure, unspecified: Principal | ICD-10-CM | POA: Diagnosis present

## 2019-09-26 DIAGNOSIS — R918 Other nonspecific abnormal finding of lung field: Secondary | ICD-10-CM | POA: Diagnosis present

## 2019-09-26 DIAGNOSIS — I959 Hypotension, unspecified: Secondary | ICD-10-CM | POA: Diagnosis present

## 2019-09-26 DIAGNOSIS — D696 Thrombocytopenia, unspecified: Secondary | ICD-10-CM | POA: Diagnosis present

## 2019-09-26 DIAGNOSIS — C787 Secondary malignant neoplasm of liver and intrahepatic bile duct: Secondary | ICD-10-CM | POA: Diagnosis present

## 2019-09-26 DIAGNOSIS — R627 Adult failure to thrive: Secondary | ICD-10-CM | POA: Diagnosis present

## 2019-09-26 DIAGNOSIS — R404 Transient alteration of awareness: Secondary | ICD-10-CM | POA: Diagnosis not present

## 2019-09-26 DIAGNOSIS — J9611 Chronic respiratory failure with hypoxia: Secondary | ICD-10-CM | POA: Diagnosis present

## 2019-09-26 DIAGNOSIS — R7401 Elevation of levels of liver transaminase levels: Secondary | ICD-10-CM | POA: Diagnosis present

## 2019-09-26 DIAGNOSIS — E860 Dehydration: Secondary | ICD-10-CM | POA: Diagnosis not present

## 2019-09-26 DIAGNOSIS — R160 Hepatomegaly, not elsewhere classified: Secondary | ICD-10-CM | POA: Diagnosis present

## 2019-09-26 DIAGNOSIS — U071 COVID-19: Secondary | ICD-10-CM | POA: Diagnosis present

## 2019-09-26 DIAGNOSIS — J449 Chronic obstructive pulmonary disease, unspecified: Secondary | ICD-10-CM | POA: Diagnosis present

## 2019-09-26 DIAGNOSIS — N1832 Chronic kidney disease, stage 3b: Secondary | ICD-10-CM | POA: Diagnosis present

## 2019-09-26 DIAGNOSIS — Z833 Family history of diabetes mellitus: Secondary | ICD-10-CM

## 2019-09-26 DIAGNOSIS — K5010 Crohn's disease of large intestine without complications: Secondary | ICD-10-CM | POA: Diagnosis present

## 2019-09-26 DIAGNOSIS — Z8349 Family history of other endocrine, nutritional and metabolic diseases: Secondary | ICD-10-CM

## 2019-09-26 DIAGNOSIS — Z811 Family history of alcohol abuse and dependence: Secondary | ICD-10-CM

## 2019-09-26 DIAGNOSIS — R7989 Other specified abnormal findings of blood chemistry: Secondary | ICD-10-CM | POA: Diagnosis present

## 2019-09-26 DIAGNOSIS — E875 Hyperkalemia: Principal | ICD-10-CM | POA: Diagnosis present

## 2019-09-26 DIAGNOSIS — E43 Unspecified severe protein-calorie malnutrition: Secondary | ICD-10-CM | POA: Diagnosis present

## 2019-09-26 DIAGNOSIS — R64 Cachexia: Secondary | ICD-10-CM | POA: Diagnosis present

## 2019-09-26 DIAGNOSIS — C3491 Malignant neoplasm of unspecified part of right bronchus or lung: Secondary | ICD-10-CM | POA: Diagnosis present

## 2019-09-26 DIAGNOSIS — R0602 Shortness of breath: Secondary | ICD-10-CM | POA: Diagnosis not present

## 2019-09-26 DIAGNOSIS — Z8249 Family history of ischemic heart disease and other diseases of the circulatory system: Secondary | ICD-10-CM

## 2019-09-26 DIAGNOSIS — I129 Hypertensive chronic kidney disease with stage 1 through stage 4 chronic kidney disease, or unspecified chronic kidney disease: Secondary | ICD-10-CM | POA: Diagnosis present

## 2019-09-26 DIAGNOSIS — R531 Weakness: Secondary | ICD-10-CM

## 2019-09-26 DIAGNOSIS — R52 Pain, unspecified: Secondary | ICD-10-CM | POA: Diagnosis not present

## 2019-09-26 DIAGNOSIS — Z20822 Contact with and (suspected) exposure to covid-19: Secondary | ICD-10-CM | POA: Diagnosis not present

## 2019-09-26 DIAGNOSIS — Z9981 Dependence on supplemental oxygen: Secondary | ICD-10-CM

## 2019-09-26 DIAGNOSIS — N189 Chronic kidney disease, unspecified: Secondary | ICD-10-CM | POA: Diagnosis present

## 2019-09-26 DIAGNOSIS — E872 Acidosis: Secondary | ICD-10-CM | POA: Diagnosis present

## 2019-09-26 DIAGNOSIS — Z66 Do not resuscitate: Secondary | ICD-10-CM | POA: Diagnosis not present

## 2019-09-26 DIAGNOSIS — Z681 Body mass index (BMI) 19 or less, adult: Secondary | ICD-10-CM

## 2019-09-26 DIAGNOSIS — R410 Disorientation, unspecified: Secondary | ICD-10-CM | POA: Diagnosis not present

## 2019-09-26 DIAGNOSIS — Z87891 Personal history of nicotine dependence: Secondary | ICD-10-CM

## 2019-09-26 DIAGNOSIS — R000 Tachycardia, unspecified: Secondary | ICD-10-CM | POA: Diagnosis present

## 2019-09-26 LAB — CBC WITH DIFFERENTIAL/PLATELET
Abs Immature Granulocytes: 0.76 10*3/uL — ABNORMAL HIGH (ref 0.00–0.07)
Basophils Absolute: 0.1 10*3/uL (ref 0.0–0.1)
Basophils Relative: 0 %
Eosinophils Absolute: 0 10*3/uL (ref 0.0–0.5)
Eosinophils Relative: 0 %
HCT: 42.4 % (ref 36.0–46.0)
Hemoglobin: 12.7 g/dL (ref 12.0–15.0)
Immature Granulocytes: 3 %
Lymphocytes Relative: 3 %
Lymphs Abs: 0.8 10*3/uL (ref 0.7–4.0)
MCH: 24.9 pg — ABNORMAL LOW (ref 26.0–34.0)
MCHC: 30 g/dL (ref 30.0–36.0)
MCV: 83.1 fL (ref 80.0–100.0)
Monocytes Absolute: 1 10*3/uL (ref 0.1–1.0)
Monocytes Relative: 3 %
Neutro Abs: 25.9 10*3/uL — ABNORMAL HIGH (ref 1.7–7.7)
Neutrophils Relative %: 91 %
Platelets: 88 10*3/uL — ABNORMAL LOW (ref 150–400)
RBC: 5.1 MIL/uL (ref 3.87–5.11)
RDW: 17.6 % — ABNORMAL HIGH (ref 11.5–15.5)
WBC: 28.6 10*3/uL — ABNORMAL HIGH (ref 4.0–10.5)
nRBC: 0.1 % (ref 0.0–0.2)

## 2019-09-26 LAB — BASIC METABOLIC PANEL
Anion gap: 14 (ref 5–15)
BUN: 68 mg/dL — ABNORMAL HIGH (ref 8–23)
CO2: 18 mmol/L — ABNORMAL LOW (ref 22–32)
Calcium: 7.5 mg/dL — ABNORMAL LOW (ref 8.9–10.3)
Chloride: 96 mmol/L — ABNORMAL LOW (ref 98–111)
Creatinine, Ser: 1.38 mg/dL — ABNORMAL HIGH (ref 0.44–1.00)
GFR calc Af Amer: 45 mL/min — ABNORMAL LOW (ref >60)
GFR calc non Af Amer: 39 mL/min — ABNORMAL LOW (ref >60)
Glucose, Bld: 110 mg/dL — ABNORMAL HIGH (ref 70–99)
Potassium: 5.7 mmol/L — ABNORMAL HIGH (ref 3.5–5.1)
Sodium: 128 mmol/L — ABNORMAL LOW (ref 135–145)

## 2019-09-26 MED ORDER — SODIUM CHLORIDE 0.9 % IV BOLUS
1000.0000 mL | Freq: Once | INTRAVENOUS | Status: AC
  Administered 2019-09-26: 1000 mL via INTRAVENOUS

## 2019-09-26 MED ORDER — LACTATED RINGERS IV BOLUS
1000.0000 mL | Freq: Once | INTRAVENOUS | Status: AC
  Administered 2019-09-26: 1000 mL via INTRAVENOUS

## 2019-09-26 MED ORDER — SODIUM ZIRCONIUM CYCLOSILICATE 5 G PO PACK
10.0000 g | PACK | Freq: Once | ORAL | Status: AC
  Administered 2019-09-26: 10 g via ORAL
  Filled 2019-09-26: qty 2

## 2019-09-26 NOTE — ED Notes (Signed)
Lab tech notified of lab orders.

## 2019-09-26 NOTE — ED Notes (Signed)
Pt daughter in law reports that since pt was discharged yesterday she hasn't been able to eat, very little PO intake, unable to stand with out assistance. Family concerned that she is continuing to decline at home and needs to "stay" in the hospital to regain her strength.

## 2019-09-26 NOTE — ED Triage Notes (Signed)
Per pt "I tried to walk to the bathroom and my legs folded under me. I can't walk."

## 2019-09-27 ENCOUNTER — Other Ambulatory Visit: Payer: Self-pay

## 2019-09-27 ENCOUNTER — Encounter (HOSPITAL_COMMUNITY): Payer: Self-pay | Admitting: Family Medicine

## 2019-09-27 DIAGNOSIS — C3491 Malignant neoplasm of unspecified part of right bronchus or lung: Secondary | ICD-10-CM | POA: Diagnosis not present

## 2019-09-27 DIAGNOSIS — D696 Thrombocytopenia, unspecified: Secondary | ICD-10-CM | POA: Diagnosis present

## 2019-09-27 DIAGNOSIS — J9611 Chronic respiratory failure with hypoxia: Secondary | ICD-10-CM

## 2019-09-27 DIAGNOSIS — U071 COVID-19: Secondary | ICD-10-CM

## 2019-09-27 DIAGNOSIS — Z9049 Acquired absence of other specified parts of digestive tract: Secondary | ICD-10-CM | POA: Diagnosis not present

## 2019-09-27 DIAGNOSIS — Z833 Family history of diabetes mellitus: Secondary | ICD-10-CM | POA: Diagnosis not present

## 2019-09-27 DIAGNOSIS — E43 Unspecified severe protein-calorie malnutrition: Secondary | ICD-10-CM | POA: Diagnosis not present

## 2019-09-27 DIAGNOSIS — C787 Secondary malignant neoplasm of liver and intrahepatic bile duct: Secondary | ICD-10-CM | POA: Diagnosis present

## 2019-09-27 DIAGNOSIS — J449 Chronic obstructive pulmonary disease, unspecified: Secondary | ICD-10-CM | POA: Diagnosis present

## 2019-09-27 DIAGNOSIS — Z20822 Contact with and (suspected) exposure to covid-19: Secondary | ICD-10-CM | POA: Diagnosis present

## 2019-09-27 DIAGNOSIS — E872 Acidosis: Secondary | ICD-10-CM | POA: Diagnosis present

## 2019-09-27 DIAGNOSIS — R160 Hepatomegaly, not elsewhere classified: Secondary | ICD-10-CM | POA: Diagnosis not present

## 2019-09-27 DIAGNOSIS — N1832 Chronic kidney disease, stage 3b: Secondary | ICD-10-CM

## 2019-09-27 DIAGNOSIS — E875 Hyperkalemia: Secondary | ICD-10-CM | POA: Diagnosis not present

## 2019-09-27 DIAGNOSIS — Z8349 Family history of other endocrine, nutritional and metabolic diseases: Secondary | ICD-10-CM | POA: Diagnosis not present

## 2019-09-27 DIAGNOSIS — D72829 Elevated white blood cell count, unspecified: Secondary | ICD-10-CM | POA: Diagnosis present

## 2019-09-27 DIAGNOSIS — R64 Cachexia: Secondary | ICD-10-CM | POA: Diagnosis present

## 2019-09-27 DIAGNOSIS — E860 Dehydration: Secondary | ICD-10-CM | POA: Diagnosis present

## 2019-09-27 DIAGNOSIS — N390 Urinary tract infection, site not specified: Secondary | ICD-10-CM | POA: Diagnosis present

## 2019-09-27 DIAGNOSIS — I129 Hypertensive chronic kidney disease with stage 1 through stage 4 chronic kidney disease, or unspecified chronic kidney disease: Secondary | ICD-10-CM | POA: Diagnosis present

## 2019-09-27 DIAGNOSIS — Z8249 Family history of ischemic heart disease and other diseases of the circulatory system: Secondary | ICD-10-CM | POA: Diagnosis not present

## 2019-09-27 DIAGNOSIS — N179 Acute kidney failure, unspecified: Principal | ICD-10-CM

## 2019-09-27 DIAGNOSIS — M810 Age-related osteoporosis without current pathological fracture: Secondary | ICD-10-CM | POA: Diagnosis present

## 2019-09-27 DIAGNOSIS — Z681 Body mass index (BMI) 19 or less, adult: Secondary | ICD-10-CM | POA: Diagnosis not present

## 2019-09-27 DIAGNOSIS — R531 Weakness: Secondary | ICD-10-CM | POA: Diagnosis not present

## 2019-09-27 DIAGNOSIS — K5010 Crohn's disease of large intestine without complications: Secondary | ICD-10-CM | POA: Diagnosis present

## 2019-09-27 DIAGNOSIS — R918 Other nonspecific abnormal finding of lung field: Secondary | ICD-10-CM | POA: Diagnosis not present

## 2019-09-27 DIAGNOSIS — Z66 Do not resuscitate: Secondary | ICD-10-CM | POA: Diagnosis present

## 2019-09-27 DIAGNOSIS — Z515 Encounter for palliative care: Secondary | ICD-10-CM | POA: Diagnosis not present

## 2019-09-27 LAB — RESPIRATORY PANEL BY RT PCR (FLU A&B, COVID)
Influenza A by PCR: NEGATIVE
Influenza A by PCR: NEGATIVE
Influenza B by PCR: NEGATIVE
Influenza B by PCR: NEGATIVE
SARS Coronavirus 2 by RT PCR: NEGATIVE
SARS Coronavirus 2 by RT PCR: NEGATIVE

## 2019-09-27 LAB — BASIC METABOLIC PANEL
Anion gap: 15 (ref 5–15)
BUN: 65 mg/dL — ABNORMAL HIGH (ref 8–23)
CO2: 16 mmol/L — ABNORMAL LOW (ref 22–32)
Calcium: 7.1 mg/dL — ABNORMAL LOW (ref 8.9–10.3)
Chloride: 100 mmol/L (ref 98–111)
Creatinine, Ser: 1.21 mg/dL — ABNORMAL HIGH (ref 0.44–1.00)
GFR calc Af Amer: 52 mL/min — ABNORMAL LOW (ref >60)
GFR calc non Af Amer: 45 mL/min — ABNORMAL LOW (ref >60)
Glucose, Bld: 110 mg/dL — ABNORMAL HIGH (ref 70–99)
Potassium: 5.5 mmol/L — ABNORMAL HIGH (ref 3.5–5.1)
Sodium: 131 mmol/L — ABNORMAL LOW (ref 135–145)

## 2019-09-27 LAB — LACTIC ACID, PLASMA
Lactic Acid, Venous: 3.7 mmol/L (ref 0.5–1.9)
Lactic Acid, Venous: 3.3 mmol/L (ref 0.5–1.9)
Lactic Acid, Venous: 3.2 mmol/L (ref 0.5–1.9)

## 2019-09-27 LAB — URINALYSIS, ROUTINE W REFLEX MICROSCOPIC
Bilirubin Urine: NEGATIVE
Glucose, UA: NEGATIVE mg/dL
Ketones, ur: NEGATIVE mg/dL
Nitrite: NEGATIVE
Protein, ur: NEGATIVE mg/dL
Specific Gravity, Urine: 1.012 (ref 1.005–1.030)
pH: 5 (ref 5.0–8.0)

## 2019-09-27 LAB — HEPATIC FUNCTION PANEL
ALT: 96 U/L — ABNORMAL HIGH (ref 0–44)
AST: 280 U/L — ABNORMAL HIGH (ref 15–41)
Albumin: 1.8 g/dL — ABNORMAL LOW (ref 3.5–5.0)
Alkaline Phosphatase: 490 U/L — ABNORMAL HIGH (ref 38–126)
Bilirubin, Direct: 1.1 mg/dL — ABNORMAL HIGH (ref 0.0–0.2)
Indirect Bilirubin: 1 mg/dL — ABNORMAL HIGH (ref 0.3–0.9)
Total Bilirubin: 2.1 mg/dL — ABNORMAL HIGH (ref 0.3–1.2)
Total Protein: 4.2 g/dL — ABNORMAL LOW (ref 6.5–8.1)

## 2019-09-27 LAB — PROCALCITONIN: Procalcitonin: 12.16 ng/mL

## 2019-09-27 LAB — C-REACTIVE PROTEIN
CRP: 24.6 mg/dL — ABNORMAL HIGH (ref <1.0)
CRP: 25.9 mg/dL — ABNORMAL HIGH (ref <1.0)

## 2019-09-27 LAB — VITAMIN D 25 HYDROXY (VIT D DEFICIENCY, FRACTURES): Vit D, 25-Hydroxy: 16.56 ng/mL — ABNORMAL LOW (ref 30–100)

## 2019-09-27 LAB — D-DIMER, QUANTITATIVE: D-Dimer, Quant: >20 ug/mL-FEU — ABNORMAL HIGH (ref 0.00–0.50)

## 2019-09-27 LAB — TROPONIN I (HIGH SENSITIVITY)
Troponin I (High Sensitivity): 29 ng/L — ABNORMAL HIGH (ref <18)
Troponin I (High Sensitivity): 26 ng/L — ABNORMAL HIGH (ref <18)

## 2019-09-27 LAB — AMMONIA: Ammonia: 23 umol/L (ref 9–35)

## 2019-09-27 LAB — LACTATE DEHYDROGENASE: LDH: 8003 U/L — ABNORMAL HIGH (ref 98–192)

## 2019-09-27 LAB — BRAIN NATRIURETIC PEPTIDE: B Natriuretic Peptide: 307 pg/mL — ABNORMAL HIGH (ref 0.0–100.0)

## 2019-09-27 LAB — ABO/RH: ABO/RH(D): A POS

## 2019-09-27 MED ORDER — PROCHLORPERAZINE 25 MG RE SUPP
25.0000 mg | Freq: Two times a day (BID) | RECTAL | Status: DC | PRN
  Filled 2019-09-27: qty 1

## 2019-09-27 MED ORDER — OXYCODONE HCL 5 MG PO TABS
5.0000 mg | ORAL_TABLET | ORAL | Status: DC | PRN
  Administered 2019-09-27: 5 mg via ORAL
  Filled 2019-09-27: qty 1

## 2019-09-27 MED ORDER — SODIUM CHLORIDE 0.9 % IV SOLN
1.0000 g | INTRAVENOUS | Status: DC
  Administered 2019-09-27: 1 g via INTRAVENOUS
  Filled 2019-09-27: qty 10

## 2019-09-27 MED ORDER — LORAZEPAM 2 MG/ML IJ SOLN
1.0000 mg | INTRAMUSCULAR | Status: DC | PRN
  Administered 2019-09-28: 1 mg via INTRAVENOUS
  Filled 2019-09-27: qty 1

## 2019-09-27 MED ORDER — DIPHENHYDRAMINE HCL 50 MG/ML IJ SOLN: 12.5000 mg | INTRAMUSCULAR | Status: DC | PRN

## 2019-09-27 MED ORDER — ZINC SULFATE 220 (50 ZN) MG PO CAPS: 220.0000 mg | ORAL_CAPSULE | Freq: Every day | ORAL | Status: DC

## 2019-09-27 MED ORDER — HYDROCOD POLST-CPM POLST ER 10-8 MG/5ML PO SUER: 5.0000 mL | Freq: Two times a day (BID) | ORAL | Status: DC | PRN

## 2019-09-27 MED ORDER — TRAZODONE HCL 50 MG PO TABS: 25.0000 mg | ORAL_TABLET | Freq: Every evening | ORAL | Status: DC | PRN

## 2019-09-27 MED ORDER — GLYCOPYRROLATE 0.2 MG/ML IJ SOLN: 0.2000 mg | INTRAMUSCULAR | Status: DC | PRN

## 2019-09-27 MED ORDER — IPRATROPIUM-ALBUTEROL 20-100 MCG/ACT IN AERS
1.0000 | INHALATION_SPRAY | Freq: Three times a day (TID) | RESPIRATORY_TRACT | Status: DC
  Administered 2019-09-28 (×2): 1 via RESPIRATORY_TRACT

## 2019-09-27 MED ORDER — CALCIUM GLUCONATE-NACL 1-0.675 GM/50ML-% IV SOLN
1.0000 g | Freq: Once | INTRAVENOUS | Status: AC
  Administered 2019-09-27: 1000 mg via INTRAVENOUS
  Filled 2019-09-27: qty 50

## 2019-09-27 MED ORDER — SODIUM ZIRCONIUM CYCLOSILICATE 10 G PO PACK
10.0000 g | PACK | Freq: Three times a day (TID) | ORAL | Status: AC
  Administered 2019-09-27 (×3): 10 g via ORAL
  Filled 2019-09-27 (×2): qty 1
  Filled 2019-09-27: qty 2

## 2019-09-27 MED ORDER — SODIUM CHLORIDE 0.9 % IV BOLUS
1000.0000 mL | Freq: Once | INTRAVENOUS | Status: AC
  Administered 2019-09-27: 1000 mL via INTRAVENOUS

## 2019-09-27 MED ORDER — PROCHLORPERAZINE EDISYLATE 10 MG/2ML IJ SOLN: 10.0000 mg | Freq: Two times a day (BID) | INTRAMUSCULAR | Status: DC | PRN

## 2019-09-27 MED ORDER — BISACODYL 10 MG RE SUPP: 10.0000 mg | Freq: Every day | RECTAL | Status: DC | PRN

## 2019-09-27 MED ORDER — ACETAMINOPHEN 325 MG PO TABS: 650.0000 mg | ORAL_TABLET | Freq: Four times a day (QID) | ORAL | Status: DC | PRN

## 2019-09-27 MED ORDER — OXYCODONE HCL 5 MG PO TABS
5.0000 mg | ORAL_TABLET | ORAL | Status: DC | PRN
  Administered 2019-09-28: 5 mg via ORAL
  Filled 2019-09-27: qty 1

## 2019-09-27 MED ORDER — LORAZEPAM 2 MG/ML PO CONC: 1.0000 mg | ORAL | Status: DC | PRN

## 2019-09-27 MED ORDER — SENNOSIDES-DOCUSATE SODIUM 8.6-50 MG PO TABS
1.0000 | ORAL_TABLET | Freq: Every day | ORAL | Status: DC
  Administered 2019-09-28: 1 via ORAL
  Filled 2019-09-27 (×4): qty 1

## 2019-09-27 MED ORDER — FUROSEMIDE 10 MG/ML IJ SOLN
30.0000 mg | Freq: Once | INTRAMUSCULAR | Status: AC
  Administered 2019-09-27: 30 mg via INTRAVENOUS
  Filled 2019-09-27: qty 4

## 2019-09-27 MED ORDER — BIOTENE DRY MOUTH MT LIQD: 15.0000 mL | OROMUCOSAL | Status: DC | PRN

## 2019-09-27 MED ORDER — HYDROMORPHONE HCL 1 MG/ML IJ SOLN
0.5000 mg | INTRAMUSCULAR | Status: DC | PRN
Start: 2019-09-27 — End: 2019-09-27

## 2019-09-27 MED ORDER — HYDROMORPHONE HCL 1 MG/ML IJ SOLN
0.5000 mg | INTRAMUSCULAR | Status: DC | PRN
  Administered 2019-09-27 (×2): 0.5 mg via INTRAVENOUS
  Administered 2019-09-28 (×4): 1 mg via INTRAVENOUS
  Administered 2019-09-28 – 2019-09-29 (×2): 0.5 mg via INTRAVENOUS
  Filled 2019-09-27 (×3): qty 1
  Filled 2019-09-27: qty 0.5
  Filled 2019-09-27: qty 1
  Filled 2019-09-27 (×2): qty 0.5
  Filled 2019-09-27: qty 1

## 2019-09-27 MED ORDER — GUAIFENESIN-DM 100-10 MG/5ML PO SYRP: 10.0000 mL | ORAL_SOLUTION | ORAL | Status: DC | PRN

## 2019-09-27 MED ORDER — LACTATED RINGERS IV BOLUS: 1000.0000 mL | Freq: Once | INTRAVENOUS | Status: DC

## 2019-09-27 MED ORDER — SODIUM CHLORIDE 0.9 % IV SOLN: INTRAVENOUS | Status: DC

## 2019-09-27 MED ORDER — GLYCOPYRROLATE 1 MG PO TABS
1.0000 mg | ORAL_TABLET | ORAL | Status: DC | PRN
  Filled 2019-09-27: qty 1

## 2019-09-27 MED ORDER — PANTOPRAZOLE SODIUM 40 MG PO TBEC
40.0000 mg | DELAYED_RELEASE_TABLET | Freq: Every day | ORAL | Status: DC
  Administered 2019-09-28: 40 mg via ORAL
  Filled 2019-09-27: qty 1

## 2019-09-27 MED ORDER — SODIUM CHLORIDE 0.9 % IV SOLN: 1.0000 g | INTRAVENOUS | Status: DC

## 2019-09-27 MED ORDER — PROMETHAZINE HCL 12.5 MG PO TABS: 12.5000 mg | ORAL_TABLET | Freq: Four times a day (QID) | ORAL | Status: DC | PRN

## 2019-09-27 MED ORDER — PROCHLORPERAZINE MALEATE 5 MG PO TABS: 5.0000 mg | ORAL_TABLET | Freq: Four times a day (QID) | ORAL | Status: DC | PRN

## 2019-09-27 MED ORDER — LORAZEPAM 1 MG PO TABS: 1.0000 mg | ORAL_TABLET | ORAL | Status: DC | PRN

## 2019-09-27 MED ORDER — IPRATROPIUM-ALBUTEROL 20-100 MCG/ACT IN AERS
1.0000 | INHALATION_SPRAY | Freq: Four times a day (QID) | RESPIRATORY_TRACT | Status: DC
  Administered 2019-09-27 (×3): 1 via RESPIRATORY_TRACT
  Filled 2019-09-27: qty 4

## 2019-09-27 MED ORDER — ASCORBIC ACID 500 MG PO TABS: 500.0000 mg | ORAL_TABLET | Freq: Every day | ORAL | Status: DC

## 2019-09-27 MED ORDER — POLYVINYL ALCOHOL 1.4 % OP SOLN
1.0000 [drp] | Freq: Four times a day (QID) | OPHTHALMIC | Status: DC | PRN
  Filled 2019-09-27: qty 15

## 2019-09-27 NOTE — ED Notes (Signed)
hospitalist paged to report critical result

## 2019-09-27 NOTE — ED Notes (Signed)
Pt in bed with eyes closed, pt arouses easily, pt offers no complaints at this time.

## 2019-09-27 NOTE — ED Notes (Signed)
Pt in bed with eyes open, pt requests a pain pill and breathing treatment, both given

## 2019-09-27 NOTE — ED Notes (Signed)
Date and time results received: 09/27/19 0410  Test: lactic Critical Value: 3.3  Name of Provider Notified: Darrick Meigs, MD  Orders Received? Or Actions Taken?: acknowledged

## 2019-09-27 NOTE — ED Notes (Signed)
Date and time results received: 09/27/19 0202  Test: lactic Critical Value: 3.7  Name of Provider Notified: Mesner, MD  Orders Received? Or Actions Taken?: acknowledged

## 2019-09-27 NOTE — ED Provider Notes (Signed)
Emergency Department Provider Note   I have reviewed the triage vital signs and the nursing notes.   HISTORY  Chief Complaint Weakness and Respiratory Distress   HPI Stacey Nash is a 69 y.o. female who presents to the emergency department today secondary to weakness, decreased appetite and inability to care for self.  Patient was discharged in the hospital approximately 24 hours ago for similar symptoms where she was found to have AKI and hyperkalemia.  She was discharged home to follow-up with her primary doctor.  Since has been home she is not eating or drinking anything.  She has significantly worsening weakness and some cough and increase in her respiratory rate so she presents here for further evaluation.  Patient no other illnesses.  She was diagnosed with Covid recently.   No other associated or modifying symptoms.    Past Medical History:  Diagnosis Date  . Allergy or intolerance to drug    Doxycycline caused worsening shortness of breath and loose stools.  . Asthma   . Chronic neck pain   . COPD (chronic obstructive pulmonary disease) ([place])   . Crohn's colitis ([place]) 10/03/2018  . Diverticula of intestine   . Dyspnea   . Hypertension   . Left adrenal mass ([place])   . On home oxygen therapy   . Osteoporosis   . Tobacco abuse     Patient Active Problem List   Diagnosis Date Noted  . Hyperkalemia   . Transaminitis   . Acute on chronic renal failure ([place]) 09/24/2019  . AKI (acute kidney injury) ([place]) 09/23/2019  . Hyponatremia 09/23/2019  . COVID-19 virus infection 09/23/2019  . Adenocarcinoma of lung, stage 4, right ([place]) 08/28/2019  . Goals of care, counseling/discussion   . Palliative care by specialist   . Lung mass   . Liver mass   . Lung cancer ([place]) 08/19/2019  . Chronic respiratory failure with hypoxia ([place]) 08/19/2019  . COPD with asthma ([place]) 08/19/2019  . Tachycardia 08/19/2019  . Fever 08/19/2019  . Crohn's colitis ([place]) 10/03/2018  .  Essential hypertension, benign 02/14/2018  . HTN, goal below 140/90 02/14/2018  . Adrenal mass ([place]) 02/14/2018  . Hyperthyroidism 10/16/2017  . Guaiac positive stools 09/25/2017  . Diarrhea 09/25/2017  . COPD exacerbation ([place]) 04/14/2013  . Hyperglycemia, drug-induced 04/14/2013  . Acute respiratory failure with hypoxia ([place]) 04/14/2013  . Hypokalemia 04/14/2013  . Allergy or intolerance to drug 04/14/2013  . Tobacco abuse 04/14/2013    Past Surgical History:  Procedure Laterality Date  . APPENDECTOMY     2013  . BIOPSY  10/01/2017   Procedure: BIOPSY;  Surgeon: Rogene Houston, MD;  Location: [facility];  Service: Endoscopy;;  left and right colon  . CHOLECYSTECTOMY    . COLONOSCOPY WITH PROPOFOL N/A 10/01/2017   Procedure: COLONOSCOPY WITH PROPOFOL;  Surgeon: Rogene Houston, MD;  Location: [facility];  Service: Endoscopy;  Laterality: N/A;  . ESOPHAGOGASTRODUODENOSCOPY N/A 09/09/2014   Procedure: ESOPHAGOGASTRODUODENOSCOPY (EGD);  Surgeon: Rogene Houston, MD;  Location: [facility];  Service: Endoscopy;  Laterality: N/A;  210 - moved to 9:10 - Ann to notify pt  . EYE SURGERY     cataract/implants  . LAPAROSCOPIC APPENDECTOMY  07/14/2011   Procedure: APPENDECTOMY LAPAROSCOPIC;  Surgeon: Donato Heinz, MD;  Location: [facility];  Service: General;  Laterality: N/A;  . POLYPECTOMY  10/01/2017   Procedure: POLYPECTOMY;  Surgeon: Rogene Houston, MD;  Location: [facility];  Service:  Endoscopy;;  rectal    Current Outpatient Rx  . Order #: [id number] Class: Historical Med  . Order #: [id number] Class: Historical Med  . Order #: [id number] Class: Historical Med  . Order #: [id number] Class: Historical Med  . Order #: [id number] Class: Historical Med  . Order #: [id number] Class: Historical Med  . Order #: [id number] Class: Normal  . Order #: [id number] Class: Historical Med  . Order #: [id number] Class: Print  . Order #: [id number] Class: Normal  . Order #: [id number] Class: Historical Med  .  Order #: [id number] Class: Print  . Order #: [id number] Class: Print  . Order #: [id number] Class: Normal  . Order #: [id number] Class: Historical Med  . Order #: [id number] Class: Historical Med  . Order #: [id number] Class: No Print  . Order #: [id number] Class: Historical Med  . Order #: [id number] Class: Historical Med  . Order #: [id number] Class: Normal  . Order #: [id number] Class: Historical Med  . Order #: [id number] Class: Normal  . Order #: [id number] Class: Historical Med  . Order #: [id number] Class: Historical Med    Allergies Sulfa antibiotics, Tetanus toxoids, Doxycycline, Statins, and Zofran [ondansetron hcl]  Family History  Problem Relation Age of Onset  . Hypertension Mother   . Thyroid disease Mother   . Diabetes Father   . Alcoholism Father   . Crohn's disease Brother   . Hypertension Son     Social History Social History   Tobacco Use  . Smoking status: Former Smoker    Packs/day: 2.00    Years: 50.00    Pack years: 100.00    Types: Cigarettes    Quit date: 09/29/2013    Years since quitting: 5.9  . Smokeless tobacco: Never Used  . Tobacco comment: quit 4 yrs ago.   Substance Use Topics  . Alcohol use: No    Alcohol/week: 0.0 standard drinks  . Drug use: No    Review of Systems  All other systems negative except as documented in the HPI. All pertinent positives and negatives as reviewed in the HPI. ____________________________________________   PHYSICAL EXAM:  VITAL SIGNS: ED Triage Vitals  Enc Vitals Group     BP 08/31/2019 1914 100/66     Pulse Rate 09/04/2019 1914 (!) 107     Resp 09/07/2019 1914 (!) 24     Temp 09/23/2019 1923 98.4 F (36.9 C)     Temp Source 09/07/2019 1923 Oral     SpO2 09/11/2019 1912 95 %     Weight 09/22/2019 1916 105 lb (47.6 kg)     Height 09/21/2019 1916 5' 6"  (1.676 m)    Constitutional: Alert and oriented. Chronically ill-appearing and in no acute distress. Cachectic. Eyes: Conjunctivae are normal. PERRL. EOMI. Sunken.  Head: Atraumatic.  Sunken temples.  Nose: No congestion/rhinnorhea. Mouth/Throat: Mucous membranes are dry.  Oropharynx non-erythematous. Neck: No stridor.  No meningeal signs.   Cardiovascular: tachycardic rate, regular rhythm. Good peripheral circulation. Grossly normal heart sounds.   Respiratory: tachypneic respiratory effort.  No retractions. Lungs CTAB. Gastrointestinal: Soft and nontender. No distention.  Musculoskeletal: No lower extremity tenderness nor edema. No gross deformities of extremities. Neurologic:  Normal speech and language. No gross focal neurologic deficits are appreciated.  Skin:  Skin is warm, dry and intact. No rash noted.  ____________________________________________   LABS (all labs ordered are listed, but only abnormal results are displayed)  Labs Reviewed  CBC WITH DIFFERENTIAL/PLATELET - Abnormal; Notable for the following components:      Result Value   WBC 28.6 (*)    MCH 24.9 (*)    RDW  17.6 (*)    Platelets 88 (*)    Neutro Abs 25.9 (*)    Abs Immature Granulocytes 0.76 (*)    All other components within normal limits  BASIC METABOLIC PANEL - Abnormal; Notable for the following components:   Sodium 128 (*)    Potassium 5.7 (*)    Chloride 96 (*)    CO2 18 (*)    Glucose, Bld 110 (*)    BUN 68 (*)    Creatinine, Ser 1.38 (*)    Calcium 7.5 (*)    GFR calc non Af Amer 39 (*)    GFR calc Af Amer 45 (*)    All other components within normal limits  LACTIC ACID, PLASMA - Abnormal; Notable for the following components:   Lactic Acid, Venous 3.7 (*)    All other components within normal limits  TROPONIN I (HIGH SENSITIVITY) - Abnormal; Notable for the following components:   Troponin I (High Sensitivity) 29 (*)    All other components within normal limits  AMMONIA  URINALYSIS, ROUTINE W REFLEX MICROSCOPIC  LACTIC ACID, PLASMA  TROPONIN I (HIGH SENSITIVITY)   ____________________________________________  EKG   EKG Interpretation  Date/Time:  Friday September 26 2019 23:18:40 EDT Ventricular Rate:  104 PR Interval:    QRS Duration: 92 QT Interval:  345 QTC Calculation: 454 R Axis:   99 Text Interpretation: Right and left arm electrode reversal, interpretation assumes no reversal Sinus tachycardia Probable lateral infarct, age indeterminate very similar to earlier in day Confirmed by Merrily Pew [phone]) on 09/27/2019 12:27:28 AM       ____________________________________________  RADIOLOGY  DG Chest Portable 1 View  Result Date: 09/01/2019 CLINICAL DATA:  Shortness of breath, history of COVID-19 positivity and known chest mass and liver metastatic disease EXAM: PORTABLE CHEST 1 VIEW COMPARISON:  09/23/2019 FINDINGS: Cardiac shadow is within normal limits. Right mid lung mass is again identified and stable. No pneumothorax is seen. No focal infiltrate is noted. No acute bony abnormality noted. IMPRESSION: Stable right chest mass.  No new focal abnormality is seen. Electronically Signed   By: Inez Catalina M.D.   On: 09/25/2019 21:28    ____________________________________________   PROCEDURES  Procedure(s) performed:   Procedures   ____________________________________________   INITIAL IMPRESSION / ASSESSMENT AND PLAN / ED COURSE  Appears chronically ill. Failure to thrive. Hyperkalemia again. Likely discharged to home inappropriately, appearrs to need higher level of care, possibly ALF vs rehab. Either way, will give fluids, hydrate, lokelma, d/w hospitalist regarding admission.  Pertinent labs & imaging results that were available during my care of the patient were reviewed by me and considered in my medical decision making (see chart for details). ____________________________________________  FINAL CLINICAL IMPRESSION(S) / ED DIAGNOSES  Final diagnoses:  Hyperkalemia  Weakness  Dehydration     MEDICATIONS GIVEN DURING THIS VISIT:  Medications  sodium chloride 0.9 % bolus 1,000 mL (0 mLs Intravenous Stopped 09/27/19 0031)   lactated ringers bolus 1,000 mL (0 mLs Intravenous Stopped 09/27/19 0119)  sodium zirconium cyclosilicate (LOKELMA) packet 10 g (10 g Oral Given 09/07/2019 2316)     NEW OUTPATIENT MEDICATIONS STARTED DURING THIS VISIT:  New Prescriptions   No medications on file    Note:  This note was prepared with assistance of Dragon voice recognition software. Occasional wrong-word or sound-a-like substitutions may have occurred due to the inherent limitations of voice recognition software.   Jadie Allington, Corene Cornea, MD 09/27/19 [phone]

## 2019-09-27 NOTE — TOC Initial Note (Addendum)
Transition of Care [facility]) - Initial/Assessment Note    Patient Details  Name: Stacey Nash MRN: [id number] Date of Birth: 1951/04/25  [facility] CM/SW Contact:    Shade Flood, LCSW Phone Number: 09/27/2019, 12:37 PM  Clinical Narrative:                  Pt admitted from home. Pt was [facility] at [facility] in Observation status 4/27-4/29. She discharged home with family at that time. Pt has metastatic lung cancer (not yet started chemo) being followed by Dr. Raliegh Ip and has positive Covid diagnosis (diagnosed on 09/23/19).   Pt has family support from her son and daughter in law.   TOC will follow and continue to assess and assist as needed.   Addendum: Received message from MD stating that pt has decided on full comfort care. Per MD, request is for residential hospice but with pt's COVID diagnosis, unsure if she will be eligible to transfer to residential hospice. Pt will likely need to be GIP status here at the hospital. Will refer.   Barriers to Discharge: Continued Medical Work up   Patient Goals and CMS Choice        Expected Discharge Plan and Services         Living arrangements for the past 2 months: Single Family Home                                      Prior Living Arrangements/Services Living arrangements for the past 2 months: Single Family Home Lives with:: Adult Children Patient language and need for interpreter reviewed:: Yes Do you feel safe going back to the place where you live?: Yes      Need for Family Participation in Patient Care: Yes (Comment) Care giver support system in place?: Yes (comment)   Criminal Activity/Legal Involvement Pertinent to Current Situation/Hospitalization: No - Comment as needed  Activities of Daily Living      Permission Sought/Granted                  Emotional Assessment       Orientation: : Oriented to Self, Oriented to Place, Oriented to  Time, Oriented to Situation Alcohol /  Substance Use: Not Applicable Psych Involvement: No (comment)  Admission diagnosis:  AKI (acute kidney injury) ([place]) [N17.9] Patient Active Problem List   Diagnosis Date Noted  . Leukocytosis 09/27/2019  . Thrombocytopenia ([place]) 09/27/2019  . Hyperkalemia   . Transaminitis   . Acute on chronic renal failure ([place]) 09/24/2019  . AKI (acute kidney injury) ([place]) 09/23/2019  . Hyponatremia 09/23/2019  . COVID-19 virus infection 09/23/2019  . Adenocarcinoma of lung, stage 4, right ([place]) 08/28/2019  . Goals of care, counseling/discussion   . Palliative care by specialist   . Lung mass   . Liver mass   . Lung cancer ([place]) 08/19/2019  . Chronic respiratory failure with hypoxia ([place]) 08/19/2019  . COPD with asthma ([name]) 08/19/2019  . Tachycardia 08/19/2019  . Fever 08/19/2019  . Crohn's colitis ([place]) 10/03/2018  . Essential hypertension, benign 02/14/2018  . HTN, goal below 140/90 02/14/2018  . Adrenal mass ([place]) 02/14/2018  . Hyperthyroidism 10/16/2017  . Guaiac positive stools 09/25/2017  . Diarrhea 09/25/2017  . COPD exacerbation ([place]) 04/14/2013  . Hyperglycemia, drug-induced 04/14/2013  . Acute respiratory failure with hypoxia ([place]) 04/14/2013  . Hypokalemia 04/14/2013  . Allergy or intolerance to  drug 04/14/2013  . Tobacco abuse 04/14/2013   PCP:  Celene Squibb, MD Pharmacy:   [facility] [address] [place] - [place] [address] [place] [address] Phone: [phone] Fax: [place], [place] - [place] [address] Phone: [phone] Fax: [phone]  [id number] [place] [id number]: [phone]: [phone]    Social Determinants of Health (SDOH) Interventions    Readmission Risk Interventions Readmission Risk Prevention Plan 09/27/2019  Transportation Screening Complete  Medication Review  (Press photographer Complete  Some recent data might be hidden

## 2019-09-27 NOTE — H&P (Signed)
History and Physical  [facility]  MINIYA MIGUEZ TLX:[id number] DOB: 1951/03/10 DOA: 09/21/2019  PCP: Celene Squibb, MD  Patient coming from: Home   I have personally briefly reviewed patient's old medical records in [place]  Chief Complaint: weakness  HPI: DOCIA KLAR is a 69 y.o. 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  She was recently discharged from the hospital after being treated for weakness dehydration electrolyte abnormalities including hyperkalemia and COVID-19 infection.  Unfortunately after arriving home she continued to have severe weakness decreased appetite and inability to care for self.  She reports nonproductive cough and worsening weakness.  She has occasional chest pain symptoms.  ED Course: Patient was lethargic and hypotensive on arrival with a BP of 83/58, pulse ox 95%, glucose 110, sodium 128, potassium 5.7, chloride 96, CO2 18, BUN 68, creatinine 1.38, calcium 7.5, LDH 8003, CRP 24.6, lactic acid 3.7, WBC 28.6, hemoglobin 12.7 platelet 88, ammonia 23, total bilirubin 2.1, total protein 4.2, ALT 96, AST 280.8.  Chest x-ray with stable tumor mass no acute findings.  Urinalysis with many bacteria WBC 21-50.  EKG with sinus tachycardia.  The patient was given 1 L bolus of IV fluids, Lokelma 10 g x 1 dose given and placed on supplemental oxygen.  Admission was requested for further management.  Review of Systems: As per HPI otherwise 10 point review of systems negative.    Past Medical History:  Diagnosis Date  . Allergy or intolerance to drug    Doxycycline caused worsening shortness of breath and loose stools.  . Asthma   . Chronic neck pain   . COPD (chronic obstructive pulmonary disease) ([place])   . Crohn's colitis ([place]) 10/03/2018  . Diverticula of intestine   . Dyspnea   . Hypertension   . Left adrenal mass  ([place])   . On home oxygen therapy   . Osteoporosis   . Tobacco abuse     Past Surgical History:  Procedure Laterality Date  . APPENDECTOMY     2013  . BIOPSY  10/01/2017   Procedure: BIOPSY;  Surgeon: Rogene Houston, MD;  Location: [facility];  Service: Endoscopy;;  left and right colon  . CHOLECYSTECTOMY    . COLONOSCOPY WITH PROPOFOL N/A 10/01/2017   Procedure: COLONOSCOPY WITH PROPOFOL;  Surgeon: Rogene Houston, MD;  Location: [facility];  Service: Endoscopy;  Laterality: N/A;  . ESOPHAGOGASTRODUODENOSCOPY N/A 09/09/2014   Procedure: ESOPHAGOGASTRODUODENOSCOPY (EGD);  Surgeon: Rogene Houston, MD;  Location: [facility];  Service: Endoscopy;  Laterality: N/A;  210 - moved to 9:10 - Ann to notify pt  . EYE SURGERY     cataract/implants  . LAPAROSCOPIC APPENDECTOMY  07/14/2011   Procedure: APPENDECTOMY LAPAROSCOPIC;  Surgeon: Donato Heinz, MD;  Location: [facility] ORS;  Service: General;  Laterality: N/A;  . POLYPECTOMY  10/01/2017   Procedure: POLYPECTOMY;  Surgeon: Rogene Houston, MD;  Location[facility];  Service: Endoscopy;;  rectal     reports that she quit smoking about 5 years ago. Her smoking use included cigarettes. She has a 100.00 pack-year smoking history. She has never used smokeless tobacco. She reports that she does not drink alcohol or use drugs.  Allergies  Allergen Reactions  . Sulfa Antibiotics Shortness Of Breath  . Tetanus Toxoids Rash  . Doxycycline Diarrhea  . Statins  Leg pain and weakness  . Zofran [Ondansetron Hcl] Hives and Rash    Rash at injection site that spread immediately following administration      Family History  Problem Relation Age of Onset  . Hypertension Mother   . Thyroid disease Mother   . Diabetes Father   . Alcoholism Father   . Crohn's disease Brother   . Hypertension Son      Prior to Admission medications   Medication Sig Start Date End Date Taking? Authorizing Provider  acetaminophen (TYLENOL) 500 MG tablet  Take 1,000 mg by mouth daily as needed for mild pain, moderate pain or headache.     [provider]  albuterol (PROVENTIL HFA;VENTOLIN HFA) 108 (90 BASE) MCG/ACT inhaler Inhale 2 puffs into the lungs every 6 (six) hours as needed for wheezing or shortness of breath.     [provider]  albuterol (PROVENTIL) (2.5 MG/3ML) 0.083% nebulizer solution Take 2.5 mg by nebulization every 6 (six) hours as needed for wheezing or shortness of breath.    [provider]  aluminum hydroxide-magnesium carbonate (GAVISCON) 95-358 MG/15ML SUSP Take 15 mLs by mouth 3 (three) times daily as needed for indigestion or heartburn.     [provider]  Calcium Carb-Cholecalciferol (CALCIUM 600 + D PO) Take 1 tablet by mouth daily.     [provider]  Cholecalciferol (VITAMIN D3) 25 MCG (1000 UT) CAPS Take 2,000 Units by mouth daily.     [provider]  feeding supplement, ENSURE ENLIVE, (ENSURE ENLIVE) LIQD Take 237 mLs by mouth 2 (two) times daily between meals. 08/21/19   Manuella Ghazi, Pratik D, DO  fluticasone (FLONASE) 50 MCG/ACT nasal spray Place 1-2 sprays into both nostrils daily as needed. 04/28/19   [provider]  loperamide (IMODIUM) 2 MG capsule Take 1 capsule (2 mg total) by mouth 4 (four) times daily as needed for diarrhea or loose stools. 03/07/16   Horton, Barbette Hair, MD  meclizine (ANTIVERT) 25 MG tablet Take 1 tablet (25 mg total) by mouth 3 (three) times daily as needed for dizziness. 09/15/19   [name], MD  Menthol, Topical Analgesic, (ICY HOT EX) Apply 1 application topically daily as needed (pain).    [provider]  Misc. Devices (ROLLER [place]) MISC Pt requires a roll-aider walker 09/18/19   [name], MD  Misc. Devices MISC Rollaider walker 09/16/19   [name], MD  Misc. Devices MISC Rollaider Gilford Rile 09/25/19   [name], MD  Multiple Vitamins-Minerals (CENTRUM SILVER ULTRA WOMENS PO) Take  by mouth daily.    [provider]  Omega-3 Fatty Acids (FISH OIL) 1000 MG CAPS Take 1,000 mg by mouth 2 (two) times daily.     [provider]  Oxycodone HCl 10 MG TABS Take 0.5-1 tablets (5-10 mg total) by mouth every 6 (six) hours as needed (pain). 09/25/19   Barton Dubois, MD  OXYCONTIN 10 MG 12 hr tablet Take 10 mg by mouth 2 (two) times daily as needed.  09/10/19   [provider]  OXYGEN Inhale 2 L into the lungs daily.    [provider]  polyethylene glycol (MIRALAX / GLYCOLAX) 17 g packet Take 17 g by mouth daily. Patient taking differently: Take 17 g by mouth daily as needed.  08/22/19   Manuella Ghazi, Pratik D, DO  promethazine (PHENERGAN) 25 MG tablet Take 12.5-25 mg by mouth every 6 (six) hours as needed. 08/06/19   [provider]  senna (SENOKOT) 8.6 MG TABS  tablet Take 1 tablet (8.6 mg total) by mouth at bedtime as needed for mild constipation. 08/21/19   Manuella Ghazi, Pratik D, DO  sodium chloride 0.9 % SOLN 50 mL with pembrolizumab 100 MG/4ML SOLN 2 mg/kg Inject 200 mg into the vein every 21 ( twenty-one) days. 09/23/19   [provider]  tiotropium (SPIRIVA HANDIHALER) 18 MCG inhalation capsule Place 18 mcg into inhaler and inhale every evening.     [provider]    Physical Exam: Vitals:   09/27/19 0600 09/27/19 0630 09/27/19 0700 09/27/19 0716  BP: 90/63 (!) 89/62 (!) 83/58 (!) 87/61  Pulse: 91 91 87 87  Resp: 12 13 11 11   Temp:      TempSrc:      SpO2: 94% 95% 95% 95%  Weight:      Height:       Constitutional: Cachectic ill-appearing female NAD, calm, cooperative Eyes: Sunken, PERRL, lids and conjunctivae normal ENMT: Mucous membranes are pale and dry.  Posterior pharynx clear of any exudate or lesions.  Neck: normal, supple, no masses, no thyromegaly.  Respiratory: Shallow breathing bilateral poor air movement diminished breath sounds left upper lobe, moderate wheezing, no crackles.  No accessory muscle use.    Cardiovascular: Tachycardic no murmurs / rubs / gallops.  2+ pitting pretibial lower extremity edema. 2+ pedal pulses. No carotid bruits.  Abdomen: Diffuse right upper quadrant tenderness, hepatomegaly. Bowel sounds hypoactive.  Musculoskeletal: no clubbing / cyanosis.  Normal muscle tone.  Skin: no rashes, lesions, ulcers. No induration Neurologic: CN 2-12 grossly intact. Sensation intact, DTR normal. Strength 5/5 in all 4.  Psychiatric: Normal judgment and insight. Alert and oriented x 3.  Flat affect.   Labs on Admission: I have personally reviewed following labs and imaging studies  CBC: Recent Labs  Lab 09/23/19 1023 09/23/19 1244 09/24/19 0603 09/25/19 0630 09/19/2019 2212  WBC 26.0* 24.2* 19.2* 25.4* 28.6*  NEUTROABS 22.8* 21.3* 17.4* 22.1* 25.9*  HGB 12.9 12.7 11.5* 11.6* 12.7  HCT 43.2 42.2 39.0 38.9 42.4  MCV 84.0 83.6 84.4 83.7 83.1  PLT 140* 139* 141* 136* 88*   Basic Metabolic Panel: Recent Labs  Lab 09/23/19 1244 09/23/19 2033 09/24/19 0603 09/25/19 0630 09/09/2019 2212  NA 128* 131* 133* 129* 128*  K 6.0* 5.0 5.2* 4.5 5.7*  CL 93* 98 99 97* 96*  CO2 19* 18* 18* 18* 18*  GLUCOSE 91 98 113* 90 110*  BUN 91* 87* 79* 71* 68*  CREATININE 1.46* 1.39* 1.19* 1.08* 1.38*  CALCIUM 8.0* 7.7* 7.7* 7.4* 7.5*   GFR: Estimated Creatinine Clearance: 28.5 mL/min (A) (by C-G formula based on SCr of 1.38 mg/dL (H)). Liver Function Tests: Recent Labs  Lab 09/23/19 1023 09/23/19 1244 09/24/19 0603 09/25/19 0630  AST 178* 177* 160* 175*  ALT 83* 80* 72* 81*  ALKPHOS 673* 639* 542* 579*  BILITOT 2.0* 2.2* 1.7* 2.0*  PROT 5.6* 5.3* 4.8* 4.8*  ALBUMIN 2.4* 2.3* 2.0* 2.1*   No results for input(s): LIPASE, AMYLASE in the last 168 hours. Recent Labs  Lab 09/27/19 0056  AMMONIA 23   Coagulation Profile: No results for input(s): INR, PROTIME in the last 168 hours. Cardiac Enzymes: Recent Labs  Lab 09/23/19 1244  CKTOTAL 133   BNP (last 3 results) No results for  input(s): PROBNP in the last 8760 hours. HbA1C: No results for input(s): HGBA1C in the last 72 hours. CBG: Recent Labs  Lab 09/23/19 1738 09/23/19 2213  GLUCAP 70 75   Lipid Profile:  No results for input(s): CHOL, HDL, LDLCALC, TRIG, CHOLHDL, LDLDIRECT in the last 72 hours. Thyroid Function Tests: No results for input(s): TSH, T4TOTAL, FREET4, T3FREE, THYROIDAB in the last 72 hours. Anemia Panel: Recent Labs    09/25/19 0630  FERRITIN 3,396*   Urine analysis:    Component Value Date/Time   COLORURINE YELLOW 09/24/2019 1018   APPEARANCEUR HAZY (A) 09/24/2019 1018   LABSPEC 1.032 (H) 09/24/2019 1018   PHURINE 5.0 09/24/2019 1018   GLUCOSEU NEGATIVE 09/24/2019 1018   HGBUR NEGATIVE 09/24/2019 1018   BILIRUBINUR NEGATIVE 09/24/2019 1018   KETONESUR NEGATIVE 09/24/2019 1018   PROTEINUR NEGATIVE 09/24/2019 1018   UROBILINOGEN 0.2 02/15/2015 1926   NITRITE NEGATIVE 09/24/2019 1018   LEUKOCYTESUR NEGATIVE 09/24/2019 1018    Radiological Exams on Admission: DG Chest Portable 1 View  Result Date: 08/29/2019 CLINICAL DATA:  Shortness of breath, history of COVID-19 positivity and known chest mass and liver metastatic disease EXAM: PORTABLE CHEST 1 VIEW COMPARISON:  09/23/2019 FINDINGS: Cardiac shadow is within normal limits. Right mid lung mass is again identified and stable. No pneumothorax is seen. No focal infiltrate is noted. No acute bony abnormality noted. IMPRESSION: Stable right chest mass.  No new focal abnormality is seen. Electronically Signed   By: Inez Catalina M.D.   On: 09/19/2019 21:28   EKG: Independently reviewed.  Sinus tachycardia  Assessment/Plan Principal Problem:   Acute on chronic renal failure (HCC) Active Problems:   Chronic respiratory failure with hypoxia (HCC)   Lung mass   Liver mass   Adenocarcinoma of lung, stage 4, right (HCC)   AKI (acute kidney injury) ([place])   COVID-19 virus infection   Hyperkalemia   Leukocytosis   Thrombocytopenia  ([place])   1. Acute kidney injury-secondary to dehydration and poor oral intake.  Treating with IV fluid hydration. 2. 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  I discussed with her oncologist Dr. Delton Coombes who tells me that there is nothing further that he can offer for treatment as she no longer qualifies for [facility] given worsening renal function and liver function.  He recommends full comfort care.  I have discussed with family and patient and will offer full comfort care. 3. Leukocytosis-WBC continues to rise, blood cultures x2 ordered.  Lactic acidosis being treated. 4. UTI-IV ceftriaxone ordered.  Urine culture ordered. 5. Hyperkalemia-Lokelma given, IV fluids given, IV Lasix and recheck potassium after therapy.  Give 1 dose of calcium gluconate. 6. COVID-19 virus infection-patient has been asymptomatic from this infection.  Family insists on retesting patient as they do not believe that she has the infection.  No pneumonia on chest x-ray. 7. Thrombocytopenia-hold all heparin products. 8. Chronic respiratory failure with hypoxia-nasal cannula for supplemental oxygen ordered.  DVT prophylaxis: SCDs  Code Status: Full   Family Communication: daughter in law updated per patient's wishes  Disposition Plan:   Consults called:  Admission status: INP   Jenae Tomasello MD [facility] How to contact the [facility] Attending or Consulting provider [place] or covering provider during after hours [place], for this patient?  1. Check the care team in [facility] and look for a) attending/consulting TRH provider listed and b) the [facility] team listed 2. Log into [email or web address] and use [place]'s universal password to access. If you do not have the password, please contact the hospital operator. 3. Locate the [facility] provider you are looking for under [facility]  and page to a number that you can be  directly reached. 4. If you still have difficulty reaching the provider, please page the [facility] (Director on Call) for the Hospitalists listed on amion for assistance.   If 7PM-7AM, please contact night-coverage [email or web address] Password [facility]  09/27/2019, 7:49 AM

## 2019-09-27 NOTE — TOC Progression Note (Signed)
Transition of Care [facility]) - Progression Note    Patient Details  Name: Stacey Nash MRN: [id number] Date of Birth: 10/19/1950  Transition of Care [facility]) CM/SW Contact  Shade Flood, LCSW Phone Number: 09/27/2019, 1:01 PM  Clinical Narrative:     [facility]. She requests clinical be faxed to them once the repeat COVID test results are available. If repeat test is positive, pt would need to be in a virtual bed here at the hospital. That process can't be initiated until Monday.   TOC will follow to assist.    Barriers to Discharge: Continued Medical Work up  Expected Discharge Plan and Services         Living arrangements for the past 2 months: Single Family Home                                       Social Determinants of Health (SDOH) Interventions    Readmission Risk Interventions Readmission Risk Prevention Plan 09/27/2019  Transportation Screening Complete  Medication Review Press photographer) Complete  Some recent data might be hidden

## 2019-09-27 NOTE — Progress Notes (Signed)
Nutrition Brief Note  Patient identified on the malnutrition screening tool.  Chart reviewed. Pt now transitioning to comfort care and pending transfer to residential hospice verses GIP status at hospital.  No further nutrition interventions warranted at this time.   Please consult as needed.    Lajuan Lines, RD, LDN Clinical Nutrition After Hours/Weekend Pager # in [place]

## 2019-09-27 NOTE — Progress Notes (Signed)
09/27/2019 12:42 PM  I spoke with patient again.  I spoke with her about my discussions with her oncologist and her daughter in law. Pt says that she is ready to die.  She only wants to be kept comfortable.  She is having a lot of pain in her liver and lungs.  She does not want CPR or intubation.  She would like for me to focus my care on keeping her comfortable.  She would like for her family to visit her if possible.   She is agreeable to doing another Covid test as her family requested that another test be done because they don't believe she has Covid. I contacted social work to make a residential hospice referral.   Murvin Natal MD  How to contact the [facility] Attending or Consulting provider [place] or covering provider during after hours [place], for this patient?  1. Check the care team in [facility] and look for a) attending/consulting TRH provider listed and b) the [facility] team listed 2. Log into [email or web address] and use [place]'s universal password to access. If you do not have the password, please contact the hospital operator. 3. Locate the [facility] provider you are looking for under [facility] and page to a number that you can be directly reached. 4. If you still have difficulty reaching the provider, please page the [facility] (Director on Call) for the Hospitalists listed on amion for assistance.

## 2019-09-27 DEATH — deceased

## 2019-09-28 DIAGNOSIS — E860 Dehydration: Secondary | ICD-10-CM

## 2019-09-28 DIAGNOSIS — R531 Weakness: Secondary | ICD-10-CM

## 2019-09-28 DIAGNOSIS — E43 Unspecified severe protein-calorie malnutrition: Secondary | ICD-10-CM

## 2019-09-28 NOTE — TOC Progression Note (Signed)
Transition of Care [facility]) - Progression Note    Patient Details  Name: STEVEY STAPLETON MRN: [id number] Date of Birth: 1951/01/13  [facility] CM/SW Contact  Shade Flood, LCSW Phone Number: 09/28/2019, 4:44 PM  Clinical Narrative:     Communicating with Hospice staff and family this afternoon. Pt is accepted for residential hospice. Due to lateness of the day, plan is for family to sign admission papers tomorrow after 10AM and then pt will transfer.  Updated MD. Marshia Ly TOC will follow.  Expected Discharge Plan: [place] Barriers to Discharge: Continued Medical Work up  Expected Discharge Plan and Services Expected Discharge Plan: [place] arrangements for the past 2 months: Single Family Home                                       Social Determinants of Health (SDOH) Interventions    Readmission Risk Interventions Readmission Risk Prevention Plan 09/27/2019  Transportation Screening Complete  Medication Review Press photographer) Complete  Some recent data might be hidden

## 2019-09-28 NOTE — Progress Notes (Addendum)
PROGRESS NOTE    Stacey Nash  IRS:[id number] DOB: March 06, 1951 DOA: 09/21/2019 PCP: Celene Squibb, MD   Chief Complaint  Patient presents with  . Weakness  . Respiratory Distress    Brief Narrative:  As per H&P written by Dr. Wynetta Emery on 09/27/2019  69 y.o. 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  She was recently discharged from the hospital after being treated for weakness dehydration electrolyte abnormalities including hyperkalemia and COVID-19 infection.  Unfortunately after arriving home she continued to have severe weakness decreased appetite and inability to care for self.  She reports nonproductive cough and worsening weakness.  She has occasional chest pain symptoms.  ED Course: Patient was lethargic and hypotensive on arrival with a BP of 83/58, pulse ox 95%, glucose 110, sodium 128, potassium 5.7, chloride 96, CO2 18, BUN 68, creatinine 1.38, calcium 7.5, LDH 8003, CRP 24.6, lactic acid 3.7, WBC 28.6, hemoglobin 12.7 platelet 88, ammonia 23, total bilirubin 2.1, total protein 4.2, ALT 96, AST 280.8.  Chest x-ray with stable tumor mass no acute findings.  Urinalysis with many bacteria WBC 21-50.  EKG with sinus tachycardia.  The patient was given 1 L bolus of IV fluids, Lokelma 10 g x 1 dose given and placed on supplemental oxygen.  Admission was requested for further management.  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 -All medications and IV fluids has been discontinued -Will focus on using symptomatic approach (Ativan, analgesics, antipyretics, antiemetics, and the use of Robinul to assist with increased upper airway secretions will be initiated). -Plan is to transfer patient to residential hospice on 2019-10-16.  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  3-protein calorie malnutrition: Moderate to severe (no longer eating or drinking anything). -Comfort feeding at this time. -Symptomatic management and comfort care.  4-concerns for UTI -No dysuria -Received 1 dose of IV ceftriaxone -No further antibiotics will be pursued -Continue comfort care management.  5-hyperkalemia -Patient received Lokelma, fluid resuscitation and calcium gluconate -Initially monitor on telemetry; now planning for comfort care management only and to treat symptoms. -Telemetry has been discontinued -No further blood work anticipated.  6-thrombocytopenia -No signs of overt bleeding -Continue holding any heparin products.  7-chronic respiratory failure with hypoxia -Continue nasal cannula oxygen supplementation.  8-recent positive COVID-19 test -2 more days has been administered demonstrating negative status -Patient afebrile without acute respiratory symptoms -No isolation needed. -   DVT prophylaxis: No DVT prophylaxis; patient with thrombocytopenia, high risk of bleeding and now focusing on symptomatic management only. Code  Status: DNR/DNI. Family  Communication: No family at bedside; nurses and social worker has updated daughter-in-law over the phone. Disposition:   Status is: Inpatient  Dispo: The patient is from: Home              Anticipated d/c is to: Residential hospice              Anticipated d/c date is: 10/06/2019              Patient currently is stable and in no major distress to pursue transfer to residential hospice and continue focusing on symptomatic management only.     Consultants:   Palliative care/hospice   Procedures:   -See below for x-ray reports.   Antimicrobials:  None   Subjective: Afebrile: No acute distress.  Throughout the night and early morning expressing significant right upper quadrant pain and mild respiratory distress.  Patient received pain medication and anxiolytics and is resting comfortable.    Objective: Vitals:   09/28/19 0735 09/28/19 1335 09/28/19 1357 09/28/19 1439  BP:  108/72  (!) 85/68  Pulse:  100  (!) 105  Resp:  18  10  Temp:    98 F (36.7 C)  TempSrc:    Oral  SpO2: 96% 100% 100% 99%  Weight:      Height:        Intake/Output Summary (Last 24 hours) at 09/28/2019 1650 Last data filed at 09/28/2019 1500 Gross per 24 hour  Intake 798.71 ml  Output 100 ml  Net 698.71 ml   Filed Weights   09/07/2019 1916  Weight: 47.6 kg    Examination:  General exam: Appears calm and comfortable; no eating or drinking; opening her eyes to voice commands.  Chronically ill and frail in appearance. Respiratory system: Positive scattered rhonchi; no wheezing or crackles appreciated. Cardiovascular system: S1 & S2 heard, RRR. No JVD, murmurs, rubs, gallops or clicks. No pedal edema. Gastrointestinal system: Abdomen is nondistended, soft and nontender. No organomegaly or masses felt. Normal bowel sounds heard. Central nervous system: Moving 4 limbs spontaneously; no focal deficits. Extremities: Cyanosis or clubbing. Skin: No rashes, no petechiae Psychiatry: Mood & affect  appropriate.     Data Reviewed: I have personally reviewed following labs and imaging studies  CBC: Recent Labs  Lab 09/23/19 1023 09/23/19 1244 09/24/19 0603 09/25/19 0630 08/30/2019 2212  WBC 26.0* 24.2* 19.2* 25.4* 28.6*  NEUTROABS 22.8* 21.3* 17.4* 22.1* 25.9*  HGB 12.9 12.7 11.5* 11.6* 12.7  HCT 43.2 42.2 39.0 38.9 42.4  MCV 84.0 83.6 84.4 83.7 83.1  PLT 140* 139* 141* 136* 88*    Basic Metabolic Panel: Recent Labs  Lab 09/23/19 2033 09/24/19 0603 09/25/19 0630 09/15/2019 2212 09/27/19 0748  NA 131* 133* 129* 128* 131*  K 5.0 5.2* 4.5 5.7* 5.5*  CL 98 99 97* 96* 100  CO2 18* 18* 18* 18* 16*  GLUCOSE 98 113* 90 110* 110*  BUN 87* 79* 71* 68* 65*  CREATININE 1.39* 1.19* 1.08* 1.38* 1.21*  CALCIUM 7.7* 7.7* 7.4* 7.5* 7.1*    GFR: Estimated Creatinine Clearance: 32.5 mL/min (A) (by C-G formula based on SCr of 1.21 mg/dL (H)).  Liver Function Tests: Recent Labs  Lab 09/23/19 1023 09/23/19 1244 09/24/19 0603 09/25/19 0630 09/27/19 0748  AST 178* 177* 160* 175* 280*  ALT 83* 80* 72* 81* 96*  ALKPHOS 673* 639* 542* 579* 490*  BILITOT 2.0* 2.2* 1.7* 2.0* 2.1*  PROT 5.6* 5.3* 4.8* 4.8* 4.2*  ALBUMIN 2.4* 2.3* 2.0*  2.1* 1.8*    CBG: Recent Labs  Lab 09/23/19 1738 09/23/19 2213  GLUCAP 70 75     Recent Results (from the past 240 hour(s))  Respiratory Panel by RT PCR (Flu A&B, Covid) - Nasopharyngeal Swab     Status: Abnormal   Collection Time: 09/23/19  4:21 PM   Specimen: Nasopharyngeal Swab  Result Value Ref Range Status   SARS Coronavirus 2 by RT PCR POSITIVE (A) NEGATIVE Final    Comment: RESULT CALLED TO, READ BACK BY AND VERIFIED WITH: ELLIS,K ON 09/23/19 AT 1750 BY LOY,C (NOTE) SARS-CoV-2 target nucleic acids are DETECTED. SARS-CoV-2 RNA is generally detectable in upper respiratory specimens  during the acute phase of infection. Positive results are indicative of the presence of the identified virus, but do not rule out bacterial infection  or co-infection with other pathogens not detected by the test. Clinical correlation with patient history and other diagnostic information is necessary to determine patient infection status. The expected result is Negative. Fact Sheet for Patients:  PinkCheek.be Fact Sheet for Healthcare Providers: GravelBags.it This test is not yet approved or cleared by the [place] FDA and  has been authorized for detection and/or diagnosis of SARS-CoV-2 by FDA under an Emergency Use Authorization (EUA).  This EUA will remain in effect (meaning this test can be used) f or the duration of  the COVID-19 declaration under Section 564(b)(1) of the Act, 21 U.S.C. section 360bbb-3(b)(1), unless the authorization is terminated or revoked sooner.    Influenza A by PCR NEGATIVE NEGATIVE Final   Influenza B by PCR NEGATIVE NEGATIVE Final    Comment: (NOTE) The Xpert Xpress SARS-CoV-2/FLU/RSV assay is intended as an aid in  the diagnosis of influenza from Nasopharyngeal swab specimens and  should not be used as a sole basis for treatment. Nasal washings and  aspirates are unacceptable for Xpert Xpress SARS-CoV-2/FLU/RSV  testing. Fact Sheet for Patients: PinkCheek.be Fact Sheet for Healthcare Providers: GravelBags.it This test is not yet approved or cleared by the [place] FDA and  has been authorized for detection and/or diagnosis of SARS-CoV-2 by  FDA under an Emergency Use Authorization (EUA). This EUA will remain  in effect (meaning this test can be used) for the duration of the  Covid-19 declaration under Section 564(b)(1) of the Act, 21  U.S.C. section 360bbb-3(b)(1), unless the authorization is  terminated or revoked. Performed at [facility], [address], [place], [place] [address]   Culture, blood (Routine X 2) w Reflex to ID Panel     Status: None (Preliminary result)    Collection Time: 09/27/19 11:29 AM   Specimen: BLOOD LEFT ARM  Result Value Ref Range Status   Specimen Description BLOOD LEFT ARM BOTTLES DRAWN AEROBIC AND ANAEROBIC  Final   Special Requests Blood Culture adequate volume  Final   Culture   Final    NO GROWTH < 24 HOURS Performed at [facility], [address], [place], [place] [address]    Report Status PENDING  Incomplete  Culture, blood (Routine X 2) w Reflex to ID Panel     Status: None (Preliminary result)   Collection Time: 09/27/19 11:50 AM   Specimen: BLOOD LEFT HAND  Result Value Ref Range Status   Specimen Description BLOOD LEFT HAND BOTTLES DRAWN AEROBIC ONLY  Final   Special Requests Blood Culture adequate volume  Final   Culture   Final    NO GROWTH < 24 HOURS Performed at [facility], [address] [place],  [place] [address]    Report Status PENDING  Incomplete  Respiratory Panel by RT PCR (Flu A&B, Covid) - Nasopharyngeal Swab     Status: None   Collection Time: 09/27/19 12:27 PM   Specimen: Nasopharyngeal Swab  Result Value Ref Range Status   SARS Coronavirus 2 by RT PCR NEGATIVE NEGATIVE Final    Comment: (NOTE) SARS-CoV-2 target nucleic acids are NOT DETECTED. The SARS-CoV-2 RNA is generally detectable in upper respiratoy specimens during the acute phase of infection. The lowest concentration of SARS-CoV-2 viral copies this assay can detect is 131 copies/mL. A negative result does not preclude SARS-Cov-2 infection and should not be used as the sole basis for treatment or other patient management decisions. A negative result may occur with  improper specimen collection/handling, submission of specimen other than nasopharyngeal swab, presence of viral mutation(s) within the areas targeted by this assay, and inadequate number of viral copies (<131 copies/mL). A negative result must be combined with clinical observations, patient history, and epidemiological information. The expected result is Negative. Fact  Sheet for Patients:  PinkCheek.be Fact Sheet for Healthcare Providers:  GravelBags.it This test is not yet ap proved or cleared by the [place] FDA and  has been authorized for detection and/or diagnosis of SARS-CoV-2 by FDA under an Emergency Use Authorization (EUA). This EUA will remain  in effect (meaning this test can be used) for the duration of the COVID-19 declaration under Section 564(b)(1) of the Act, 21 U.S.C. section 360bbb-3(b)(1), unless the authorization is terminated or revoked sooner.    Influenza A by PCR NEGATIVE NEGATIVE Final   Influenza B by PCR NEGATIVE NEGATIVE Final    Comment: (NOTE) The Xpert Xpress SARS-CoV-2/FLU/RSV assay is intended as an aid in  the diagnosis of influenza from Nasopharyngeal swab specimens and  should not be used as a sole basis for treatment. Nasal washings and  aspirates are unacceptable for Xpert Xpress SARS-CoV-2/FLU/RSV  testing. Fact Sheet for Patients: PinkCheek.be Fact Sheet for Healthcare Providers: GravelBags.it This test is not yet approved or cleared by the [place] FDA and  has been authorized for detection and/or diagnosis of SARS-CoV-2 by  FDA under an Emergency Use Authorization (EUA). This EUA will remain  in effect (meaning this test can be used) for the duration of the  Covid-19 declaration under Section 564(b)(1) of the Act, 21  U.S.C. section 360bbb-3(b)(1), unless the authorization is  terminated or revoked. Performed at [facility], [address], [place], [place] [address]   Respiratory Panel by RT PCR (Flu A&B, Covid) - Nasopharyngeal Swab     Status: None   Collection Time: 09/27/19  6:00 PM   Specimen: Nasopharyngeal Swab  Result Value Ref Range Status   SARS Coronavirus 2 by RT PCR NEGATIVE NEGATIVE Final    Comment: (NOTE) SARS-CoV-2 target nucleic acids are NOT DETECTED. The  SARS-CoV-2 RNA is generally detectable in upper respiratoy specimens during the acute phase of infection. The lowest concentration of SARS-CoV-2 viral copies this assay can detect is 131 copies/mL. A negative result does not preclude SARS-Cov-2 infection and should not be used as the sole basis for treatment or other patient management decisions. A negative result may occur with  improper specimen collection/handling, submission of specimen other than nasopharyngeal swab, presence of viral mutation(s) within the areas targeted by this assay, and inadequate number of viral copies (<131 copies/mL). A negative result must be combined with clinical observations, patient history, and epidemiological information. The expected result is Negative. Fact Sheet  for Patients:  PinkCheek.be Fact Sheet for Healthcare Providers:  GravelBags.it This test is not yet ap proved or cleared by the [place] FDA and  has been authorized for detection and/or diagnosis of SARS-CoV-2 by FDA under an Emergency Use Authorization (EUA). This EUA will remain  in effect (meaning this test can be used) for the duration of the COVID-19 declaration under Section 564(b)(1) of the Act, 21 U.S.C. section 360bbb-3(b)(1), unless the authorization is terminated or revoked sooner.    Influenza A by PCR NEGATIVE NEGATIVE Final   Influenza B by PCR NEGATIVE NEGATIVE Final    Comment: (NOTE) The Xpert Xpress SARS-CoV-2/FLU/RSV assay is intended as an aid in  the diagnosis of influenza from Nasopharyngeal swab specimens and  should not be used as a sole basis for treatment. Nasal washings and  aspirates are unacceptable for Xpert Xpress SARS-CoV-2/FLU/RSV  testing. Fact Sheet for Patients: PinkCheek.be Fact Sheet for Healthcare Providers: GravelBags.it This test is not yet approved or cleared by the [place] FDA and  has been authorized for detection and/or diagnosis of SARS-CoV-2 by  FDA under an Emergency Use Authorization (EUA). This EUA will remain  in effect (meaning this test can be used) for the duration of the  Covid-19 declaration under Section 564(b)(1) of the Act, 21  U.S.C. section 360bbb-3(b)(1), unless the authorization is  terminated or revoked. Performed at [facility], [address], [place], [place] [address]      Radiology Studies: DG Chest Portable 1 View  Result Date: 09/13/2019 CLINICAL DATA:  Shortness of breath, history of COVID-19 positivity and known chest mass and liver metastatic disease EXAM: PORTABLE CHEST 1 VIEW COMPARISON:  09/23/2019 FINDINGS: Cardiac shadow is within normal limits. Right mid lung mass is again identified and stable. No pneumothorax is seen. No focal infiltrate is noted. No acute bony abnormality noted. IMPRESSION: Stable right chest mass.  No new focal abnormality is seen. Electronically Signed   By: Inez Catalina M.D.   On: 08/28/2019 21:28    Scheduled Meds: . Ipratropium-Albuterol  1 puff Inhalation TID  . pantoprazole  40 mg Oral Daily  . senna-docusate  1 tablet Oral Daily   Continuous Infusions:   LOS: 1 day    Time spent: 35 minutes.    Barton Dubois, MD [facility]   To contact the attending provider between 7A-7P or the covering provider during after hours 7P-7A, please log into the web site [email or web address] and access using universal [place] password for that web site. If you do not have the password, please call the hospital operator.  09/28/2019, 4:50 PM

## 2019-09-30 ENCOUNTER — Other Ambulatory Visit (HOSPITAL_COMMUNITY): Payer: Medicare Other | Admitting: General Practice

## 2019-10-02 LAB — CULTURE, BLOOD (ROUTINE X 2)
Culture: NO GROWTH
Culture: NO GROWTH
Special Requests: ADEQUATE
Special Requests: ADEQUATE

## 2019-10-10 ENCOUNTER — Encounter (HOSPITAL_COMMUNITY): Payer: Medicare Other

## 2019-10-14 ENCOUNTER — Ambulatory Visit (HOSPITAL_COMMUNITY): Payer: Medicare Other

## 2019-10-14 ENCOUNTER — Other Ambulatory Visit (HOSPITAL_COMMUNITY): Payer: Medicare Other

## 2019-10-14 ENCOUNTER — Ambulatory Visit (HOSPITAL_COMMUNITY): Payer: Medicare Other | Admitting: Hematology

## 2019-10-28 NOTE — Progress Notes (Signed)
PROGRESS NOTE    Stacey Nash  KVQ:[id number] DOB: 1950-07-01 DOA: 09/22/2019 PCP: Celene Squibb, MD   Chief Complaint  Patient presents with  . Weakness  . Respiratory Distress    Brief Narrative:  As per H&P written by Dr. Wynetta Emery on 09/27/2019  69 y.o. 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  She was recently discharged from the hospital after being treated for weakness dehydration electrolyte abnormalities including hyperkalemia and COVID-19 infection.  Unfortunately after arriving home she continued to have severe weakness decreased appetite and inability to care for self.  She reports nonproductive cough and worsening weakness.  She has occasional chest pain symptoms.  ED Course: Patient was lethargic and hypotensive on arrival with a BP of 83/58, pulse ox 95%, glucose 110, sodium 128, potassium 5.7, chloride 96, CO2 18, BUN 68, creatinine 1.38, calcium 7.5, LDH 8003, CRP 24.6, lactic acid 3.7, WBC 28.6, hemoglobin 12.7 platelet 88, ammonia 23, total bilirubin 2.1, total protein 4.2, ALT 96, AST 280.8.  Chest x-ray with stable tumor mass no acute findings.  Urinalysis with many bacteria WBC 21-50.  EKG with sinus tachycardia.  The patient was given 1 L bolus of IV fluids, Lokelma 10 g x 1 dose given and placed on supplemental oxygen.  Admission was requested for further management.  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 -All medications and IV fluids has been discontinued -Will focus on using symptomatic approach (Ativan, analgesics, antipyretics, antiemetics, and the use of Robinul to assist with increased upper airway secretions will be initiated). -Patient has actively decline and currently is unresponsive, agonal breathing appreciated and low blood pressure. Appears to be actively dying. Anticipating housestaff. Transfer to hospice on be fair at this moment due to unstable condition.  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  3-protein calorie malnutrition: Moderate to severe (no longer eating or drinking anything). -Comfort feeding at this time. -Symptomatic management and comfort care.  4-concerns for UTI -No dysuria -Received 1 dose of IV ceftriaxone -No further antibiotics will be pursued -Continue comfort care and symptomatic management.  5-hyperkalemia -Patient received Lokelma, fluid resuscitation and calcium gluconate -Initially monitor on telemetry; now planning for comfort care management only and to treat symptoms. -Telemetry has been discontinued -No further blood work anticipated. -Continue comfort care.  6-thrombocytopenia -No signs of overt bleeding -Continue holding any heparin products.  7-chronic respiratory failure with hypoxia -Continue nasal cannula oxygen supplementation.  8-recent positive COVID-19 test -2 more test has been administered demonstrating negative status -Patient afebrile  without acute respiratory  symptoms -No isolation needed.   DVT prophylaxis: No DVT prophylaxis; patient with thrombocytopenia, high risk of bleeding and now focusing on symptomatic management only. Code Status: DNR/DNI. Family Communication: Whole family at bedside. Disposition:   Status is: Inpatient  Dispo: The patient is from: Home              Anticipated d/c is to: Residential hospice              Anticipated d/c date is: Currently unstable to be transferred; hospital placement has been deferred. Anticipating house death.              Patient currently has further decline and is presenting low blood pressure, agonal breathing and is unresponsive. Anticipate hospital death.     Consultants:   Palliative care/hospice   Procedures:   -See below for x-ray reports.   Antimicrobials:  None   Subjective: Underweight, frail and chronically ill in appearance. 2 L nasal cannula and placement. Patient has continued to further decline and at this moment is unresponsive, agonal breathing appreciated with low blood pressure. Appears to be actively dying.   Objective: Vitals:   09/28/19 1439 09/28/19 2118 09/28/19 2119 10-11-19 0533  BP: (!) 85/68 122/81 122/81 (!) 64/28  Pulse: (!) 105 (!) 115 (!) 116 86  Resp: 10 10 10 14   Temp: 98 F (36.7 C) 98.8 F (37.1 C) 98.8 F (37.1 C) 98.9 F (37.2 C)  TempSrc: Oral Oral Oral Oral  SpO2: 99% 90% 92%   Weight:      Height:        Intake/Output Summary (Last 24 hours) at 11-Oct-2019 1130 Last data filed at 09/28/2019 1900 Gross per 24 hour  Intake 130.48 ml  Output --  Net 130.48 ml   Filed Weights   09/25/2019 1916  Weight: 47.6 kg    Examination: General exam: appears comfortable. She is unresponsive at this moment. appreciated agonal breathing, no eating, not drinking, not following commands. Low BP (SBP in the 60's) Respiratory system: Agonal breathing appreciated on examination; no using accessory muscle. Cardiovascular system:RRR. No murmurs,  rubs or gallops. No JVD. Gastrointestinal system: Abdomen is nondistended, soft and nontender. No organomegaly or masses felt. Normal bowel sounds heard. Central nervous system: No following commands; currently unresponsive. Extremities: No cyanosis or clubbing. Skin: No rashes, lesions or ulcers. Psychiatry: Appears comfortable.   Data Reviewed: I have personally reviewed following labs and imaging studies  CBC: Recent Labs  Lab 09/23/19 1023 09/23/19 1244 09/24/19 0603 09/25/19 0630 09/01/2019 2212  WBC 26.0* 24.2* 19.2* 25.4* 28.6*  NEUTROABS 22.8* 21.3* 17.4* 22.1* 25.9*  HGB 12.9 12.7 11.5* 11.6* 12.7  HCT 43.2 42.2 39.0 38.9 42.4  MCV 84.0 83.6 84.4 83.7 83.1  PLT 140* 139* 141* 136* 88*    Basic Metabolic Panel: Recent Labs  Lab 09/23/19 2033 09/24/19 0603 09/25/19 0630 09/07/2019 2212 09/27/19 0748  NA 131* 133* 129* 128* 131*  K 5.0 5.2* 4.5 5.7* 5.5*  CL 98 99 97* 96* 100  CO2 18* 18* 18* 18* 16*  GLUCOSE 98 113* 90 110* 110*  BUN 87* 79* 71* 68* 65*  CREATININE 1.39* 1.19* 1.08* 1.38* 1.21*  CALCIUM 7.7* 7.7* 7.4* 7.5* 7.1*    GFR: Estimated Creatinine Clearance: 32.5 mL/min (A) (by C-G formula based on SCr of 1.21 mg/dL (H)).  Liver Function Tests: Recent Labs  Lab 09/23/19 1023 09/23/19 1244 09/24/19 0603 09/25/19 0630 09/27/19 0748  AST 178* 177* 160* 175*  280*  ALT 83* 80* 72* 81* 96*  ALKPHOS 673* 639* 542* 579* 490*  BILITOT 2.0* 2.2* 1.7* 2.0* 2.1*  PROT 5.6* 5.3* 4.8* 4.8* 4.2*  ALBUMIN 2.4* 2.3* 2.0* 2.1* 1.8*    CBG: Recent Labs  Lab 09/23/19 1738 09/23/19 2213  GLUCAP 70 75     Recent Results (from the past 240 hour(s))  Respiratory Panel by RT PCR (Flu A&B, Covid) - Nasopharyngeal Swab     Status: Abnormal   Collection Time: 09/23/19  4:21 PM   Specimen: Nasopharyngeal Swab  Result Value Ref Range Status   SARS Coronavirus 2 by RT PCR POSITIVE (A) NEGATIVE Final    Comment: RESULT CALLED TO, READ BACK BY AND VERIFIED  WITH: ELLIS,K ON 09/23/19 AT 1750 BY LOY,C (NOTE) SARS-CoV-2 target nucleic acids are DETECTED. SARS-CoV-2 RNA is generally detectable in upper respiratory specimens  during the acute phase of infection. Positive results are indicative of the presence of the identified virus, but do not rule out bacterial infection or co-infection with other pathogens not detected by the test. Clinical correlation with patient history and other diagnostic information is necessary to determine patient infection status. The expected result is Negative. Fact Sheet for Patients:  PinkCheek.be Fact Sheet for Healthcare Providers: GravelBags.it This test is not yet approved or cleared by the [place] FDA and  has been authorized for detection and/or diagnosis of SARS-CoV-2 by FDA under an Emergency Use Authorization (EUA).  This EUA will remain in effect (meaning this test can be used) f or the duration of  the COVID-19 declaration under Section 564(b)(1) of the Act, 21 U.S.C. section 360bbb-3(b)(1), unless the authorization is terminated or revoked sooner.    Influenza A by PCR NEGATIVE NEGATIVE Final   Influenza B by PCR NEGATIVE NEGATIVE Final    Comment: (NOTE) The Xpert Xpress SARS-CoV-2/FLU/RSV assay is intended as an aid in  the diagnosis of influenza from Nasopharyngeal swab specimens and  should not be used as a sole basis for treatment. Nasal washings and  aspirates are unacceptable for Xpert Xpress SARS-CoV-2/FLU/RSV  testing. Fact Sheet for Patients: PinkCheek.be Fact Sheet for Healthcare Providers: GravelBags.it This test is not yet approved or cleared by the [place] FDA and  has been authorized for detection and/or diagnosis of SARS-CoV-2 by  FDA under an Emergency Use Authorization (EUA). This EUA will remain  in effect (meaning this test can be used) for the  duration of the  Covid-19 declaration under Section 564(b)(1) of the Act, 21  U.S.C. section 360bbb-3(b)(1), unless the authorization is  terminated or revoked. Performed at [facility] [address], [place], [place] [address]   Culture, blood (Routine X 2) w Reflex to ID Panel     Status: None (Preliminary result)   Collection Time: 09/27/19 11:29 AM   Specimen: BLOOD LEFT ARM  Result Value Ref Range Status   Specimen Description BLOOD LEFT ARM BOTTLES DRAWN AEROBIC AND ANAEROBIC  Final   Special Requests Blood Culture adequate volume  Final   Culture   Final    NO GROWTH 2 DAYS Performed at [facility], [address], [place], [place] [address]    Report Status PENDING  Incomplete  Culture, blood (Routine X 2) w Reflex to ID Panel     Status: None (Preliminary result)   Collection Time: 09/27/19 11:50 AM   Specimen: BLOOD LEFT HAND  Result Value Ref Range Status   Specimen Description BLOOD LEFT HAND BOTTLES DRAWN AEROBIC ONLY  Final  Special Requests Blood Culture adequate volume  Final   Culture   Final    NO GROWTH 2 DAYS Performed at [facility], [address], [place], [place] [address]    Report Status PENDING  Incomplete  Respiratory Panel by RT PCR (Flu A&B, Covid) - Nasopharyngeal Swab     Status: None   Collection Time: 09/27/19 12:27 PM   Specimen: Nasopharyngeal Swab  Result Value Ref Range Status   SARS Coronavirus 2 by RT PCR NEGATIVE NEGATIVE Final    Comment: (NOTE) SARS-CoV-2 target nucleic acids are NOT DETECTED. The SARS-CoV-2 RNA is generally detectable in upper respiratoy specimens during the acute phase of infection. The lowest concentration of SARS-CoV-2 viral copies this assay can detect is 131 copies/mL. A negative result does not preclude SARS-Cov-2 infection and should not be used as the sole basis for treatment or other patient management decisions. A negative result may occur with  improper specimen collection/handling, submission of  specimen other than nasopharyngeal swab, presence of viral mutation(s) within the areas targeted by this assay, and inadequate number of viral copies (<131 copies/mL). A negative result must be combined with clinical observations, patient history, and epidemiological information. The expected result is Negative. Fact Sheet for Patients:  PinkCheek.be Fact Sheet for Healthcare Providers:  GravelBags.it This test is not yet ap proved or cleared by the [place] FDA and  has been authorized for detection and/or diagnosis of SARS-CoV-2 by FDA under an Emergency Use Authorization (EUA). This EUA will remain  in effect (meaning this test can be used) for the duration of the COVID-19 declaration under Section 564(b)(1) of the Act, 21 U.S.C. section 360bbb-3(b)(1), unless the authorization is terminated or revoked sooner.    Influenza A by PCR NEGATIVE NEGATIVE Final   Influenza B by PCR NEGATIVE NEGATIVE Final    Comment: (NOTE) The Xpert Xpress SARS-CoV-2/FLU/RSV assay is intended as an aid in  the diagnosis of influenza from Nasopharyngeal swab specimens and  should not be used as a sole basis for treatment. Nasal washings and  aspirates are unacceptable for Xpert Xpress SARS-CoV-2/FLU/RSV  testing. Fact Sheet for Patients: PinkCheek.be Fact Sheet for Healthcare Providers: GravelBags.it This test is not yet approved or cleared by the [place] FDA and  has been authorized for detection and/or diagnosis of SARS-CoV-2 by  FDA under an Emergency Use Authorization (EUA). This EUA will remain  in effect (meaning this test can be used) for the duration of the  Covid-19 declaration under Section 564(b)(1) of the Act, 21  U.S.C. section 360bbb-3(b)(1), unless the authorization is  terminated or revoked. Performed at [facility], [address] [place], [place] [address]    Respiratory Panel by RT PCR (Flu A&B, Covid) - Nasopharyngeal Swab     Status: None   Collection Time: 09/27/19  6:00 PM   Specimen: Nasopharyngeal Swab  Result Value Ref Range Status   SARS Coronavirus 2 by RT PCR NEGATIVE NEGATIVE Final    Comment: (NOTE) SARS-CoV-2 target nucleic acids are NOT DETECTED. The SARS-CoV-2 RNA is generally detectable in upper respiratoy specimens during the acute phase of infection. The lowest concentration of SARS-CoV-2 viral copies this assay can detect is 131 copies/mL. A negative result does not preclude SARS-Cov-2 infection and should not be used as the sole basis for treatment or other patient management decisions. A negative result may occur with  improper specimen collection/handling, submission of specimen other than nasopharyngeal swab, presence of viral mutation(s) within the areas targeted by this  assay, and inadequate number of viral copies (<131 copies/mL). A negative result must be combined with clinical observations, patient history, and epidemiological information. The expected result is Negative. Fact Sheet for Patients:  PinkCheek.be Fact Sheet for Healthcare Providers:  GravelBags.it This test is not yet ap proved or cleared by the [place] FDA and  has been authorized for detection and/or diagnosis of SARS-CoV-2 by FDA under an Emergency Use Authorization (EUA). This EUA will remain  in effect (meaning this test can be used) for the duration of the COVID-19 declaration under Section 564(b)(1) of the Act, 21 U.S.C. section 360bbb-3(b)(1), unless the authorization is terminated or revoked sooner.    Influenza A by PCR NEGATIVE NEGATIVE Final   Influenza B by PCR NEGATIVE NEGATIVE Final    Comment: (NOTE) The Xpert Xpress SARS-CoV-2/FLU/RSV assay is intended as an aid in  the diagnosis of influenza from Nasopharyngeal swab specimens and  should not be used as a sole  basis for treatment. Nasal washings and  aspirates are unacceptable for Xpert Xpress SARS-CoV-2/FLU/RSV  testing. Fact Sheet for Patients: PinkCheek.be Fact Sheet for Healthcare Providers: GravelBags.it This test is not yet approved or cleared by the [place] FDA and  has been authorized for detection and/or diagnosis of SARS-CoV-2 by  FDA under an Emergency Use Authorization (EUA). This EUA will remain  in effect (meaning this test can be used) for the duration of the  Covid-19 declaration under Section 564(b)(1) of the Act, 21  U.S.C. section 360bbb-3(b)(1), unless the authorization is  terminated or revoked. Performed at [facility], [address] [place], [place] [address]      Radiology Studies: No results found.  Scheduled Meds: . pantoprazole  40 mg Oral Daily  . senna-docusate  1 tablet Oral Daily   Continuous Infusions:   LOS: 2 days    Time spent: 35 minutes.    Barton Dubois, MD [facility]   To contact the attending provider between 7A-7P or the covering provider during after hours 7P-7A, please log into the web site [email or web address] and access using universal [place] password for that web site. If you do not have the password, please call the hospital operator.  10-23-2019, 11:30 AM

## 2019-10-28 NOTE — Progress Notes (Signed)
Patients blood pressure, heart rate and respirations have decreased significantly throughout the night.  Patient is no longer responding to touch or voice.  Hands and feet are cold and starting to mottle.  Patients family notified that her condition is deteriorating and that they are welcome to come be with the patient.  AC and security notified that family my be coming prior to visiting hours.  Will continue to monitor patients condition.

## 2019-10-28 NOTE — Progress Notes (Addendum)
Family called staff to room, patient pronounced deceased at 17 by 2 RNs, MD informed, [facility] called. Patient's family remain at bedside

## 2019-10-28 NOTE — Discharge Summary (Signed)
Death Summary  Stacey Nash UUE:[id number] DOB: [birth year] DOA: 10/10/19  PCP: Celene Squibb, MD PCP/Office notified: MD office notified through epic.  Admit date: 10-Oct-2019 Date of Death: 10/13/19  Final Diagnoses:  Acute on chronic renal failure (HCC) Chronic respiratory failure with hypoxia (HCC) Lung mass Liver mass Adenocarcinoma of lung, stage 4, right (HCC) False positive COVID-19 test Hyperkalemia Leukocytosis Thrombocytopenia (HCC) Severe protein calorie malnutrition Prerenal azotemia Transaminitis  History of present illness:  As per H&P written by Dr. Wynetta Emery on 09/27/2019  69 y.o.femalewith medical history significantforrecently diagnosed in the last month non-small cell lung cancer metastatic to the liver and is not yet started on chemotherapy. She was recently discharged from the hospital after being treated for weakness dehydration electrolyte abnormalities including hyperkalemia and COVID-19 infection. Unfortunately after arriving home she continued to have severe weakness decreased appetite and inability to care for self. She reports nonproductive cough and worsening weakness. She has occasional chest pain symptoms.  ED Course:Patient was lethargic and hypotensive on arrival with a BP of 83/58, pulse ox 95%, glucose 110, sodium 128, potassium 5.7, chloride 96, CO2 18, BUN 68, creatinine 1.38, calcium 7.5, LDH 8003, CRP 24.6, lactic acid 3.7, WBC 28.6, hemoglobin 12.7 platelet 88, ammonia 23, total bilirubin 2.1, total protein 4.2, ALT 96, AST 280.8. Chest x-ray with stable tumor mass no acute findings. Urinalysis with many bacteria WBC 21-50. EKG with sinus tachycardia. The patient was given 1 L bolus of IV fluids, Lokelma 10 g x 1 dose given and placed on supplemental oxygen. Admission was requested for further management.  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 -After discussing goals of care and unfortunately lack of anticipated improvement due to underlying history of lung cancer with metastasis (no longer a candidate for chemotherapy), decision made to proceed with comfort care and symptomatic management only. -All medications and IV fluids were discontinued. -Will focus on using symptomatic approach (Ativan, analgesics, antipyretics, antiemetics, and the use of Robinul initiated). -Patient comfortably passed away around 2:10 PM, family was at bedside with her.  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 -Comfort feeding at this time. -Symptomatic management and comfort care.  4-concerns for UTI -No dysuria -Received 1 dose of IV ceftriaxone -No further antibiotics will be pursued -Continue comfort care and symptomatic management.  5-hyperkalemia -Patient received Lokelma, fluid resuscitation and calcium gluconate -Initially monitor on telemetry; now planning for comfort care management only and to treat symptoms. -Telemetry has been discontinued -No further blood work anticipated. -Continue comfort care.  6-thrombocytopenia -No signs of overt bleeding -Continue holding any heparin products.  7-chronic respiratory failure with hypoxia -Continue nasal cannula oxygen supplementation.  8-False positive COVID-19 test -2 more test has been administered demonstrating negative status -Patient afebrile without acute respiratory symptoms -No isolation needed.   Time: 25  Signed:  Barton Dubois  [facility] 13-Oct-2019, 3:08 PM

## 2019-10-28 DEATH — deceased

## 2020-05-04 ENCOUNTER — Ambulatory Visit (INDEPENDENT_AMBULATORY_CARE_PROVIDER_SITE_OTHER): Payer: Medicare Other | Admitting: Internal Medicine

## 2020-07-02 IMAGING — MG DIGITAL SCREENING BILATERAL MAMMOGRAM WITH TOMO AND CAD
8 series · 8 of 24 positions shown · non-contrast
Comparison: Previous exam(s).

CLINICAL DATA: Screening.

EXAM:
DIGITAL SCREENING BILATERAL MAMMOGRAM WITH TOMO AND CAD

[R CC synth-2D]
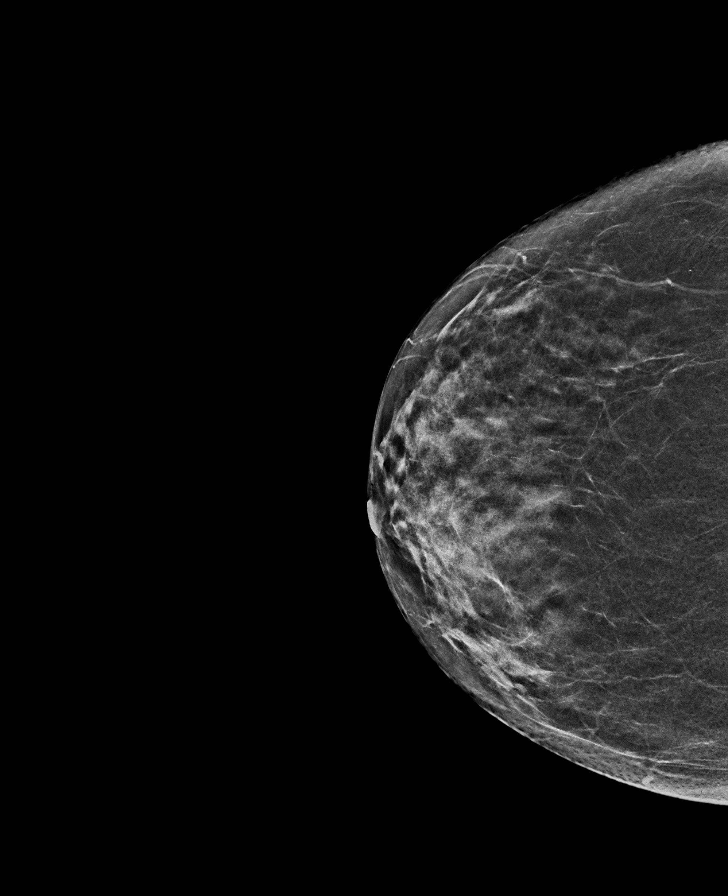

[L CC synth-2D]
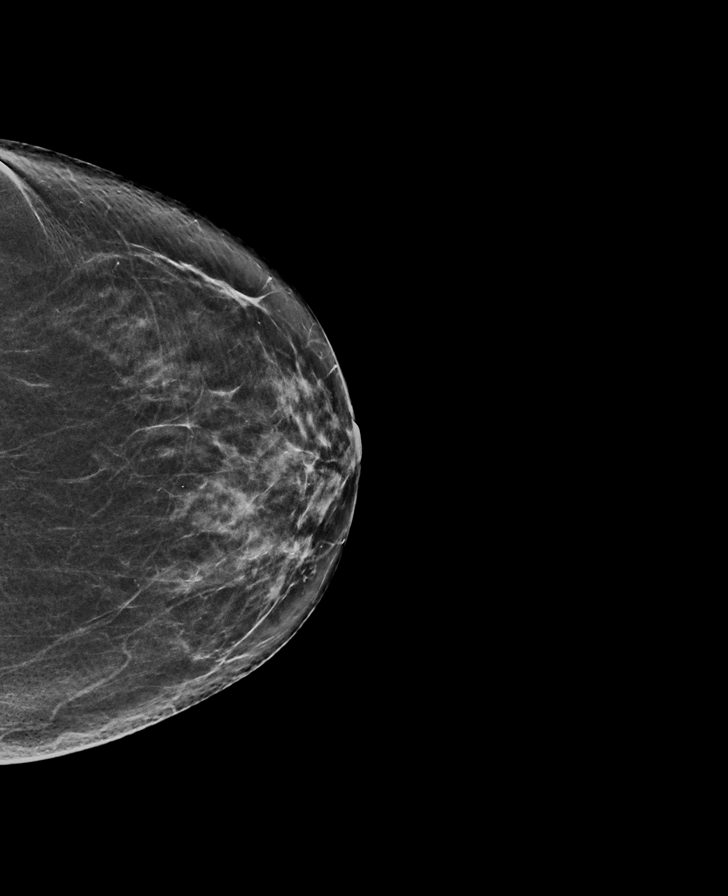

[L MLO synth-2D]
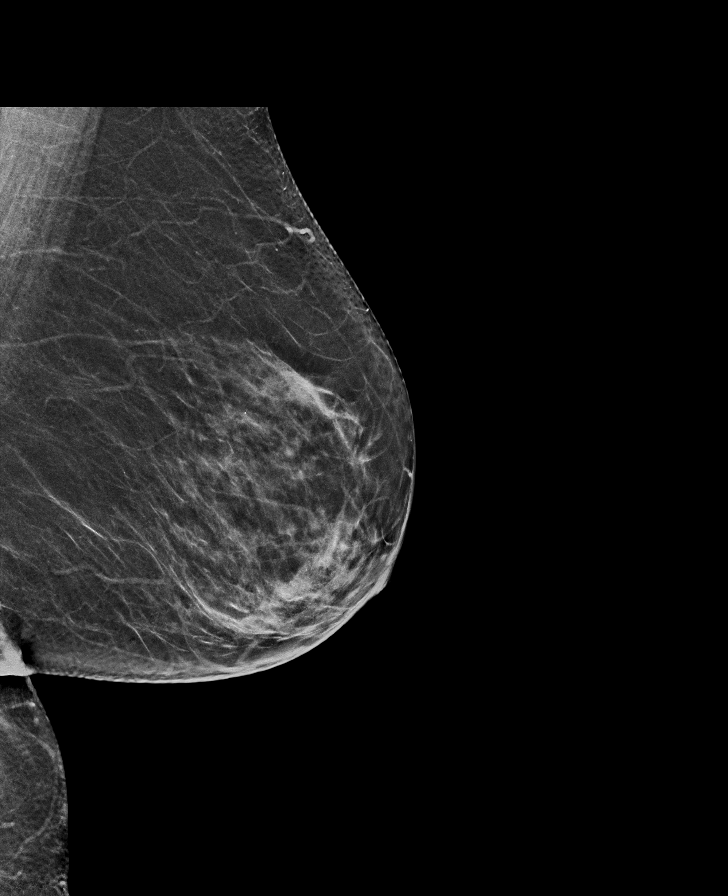

[R MLO synth-2D]
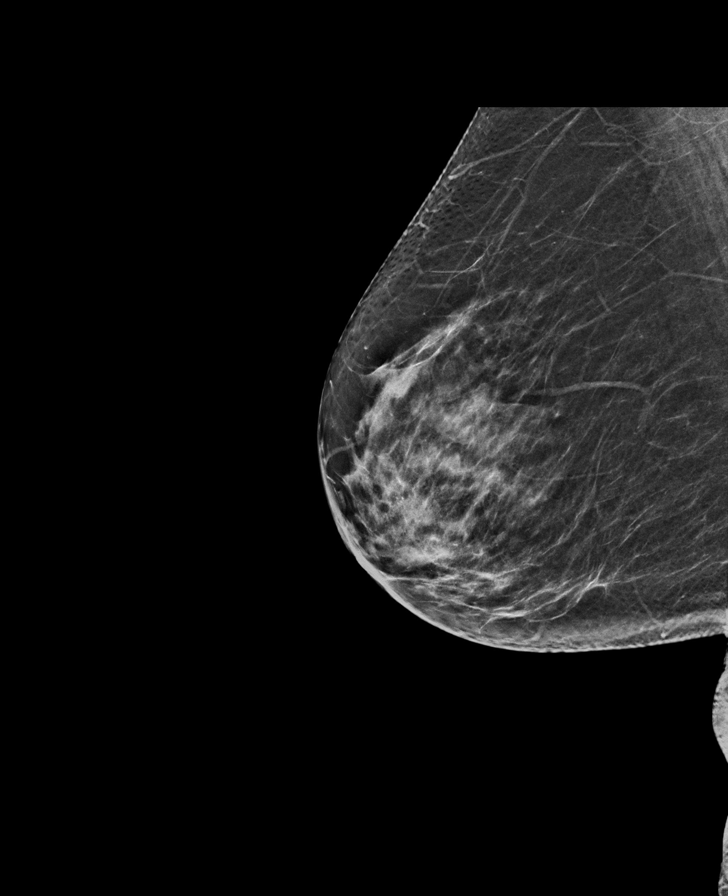

[R MLO tomo · tomo slice 31/61.0]
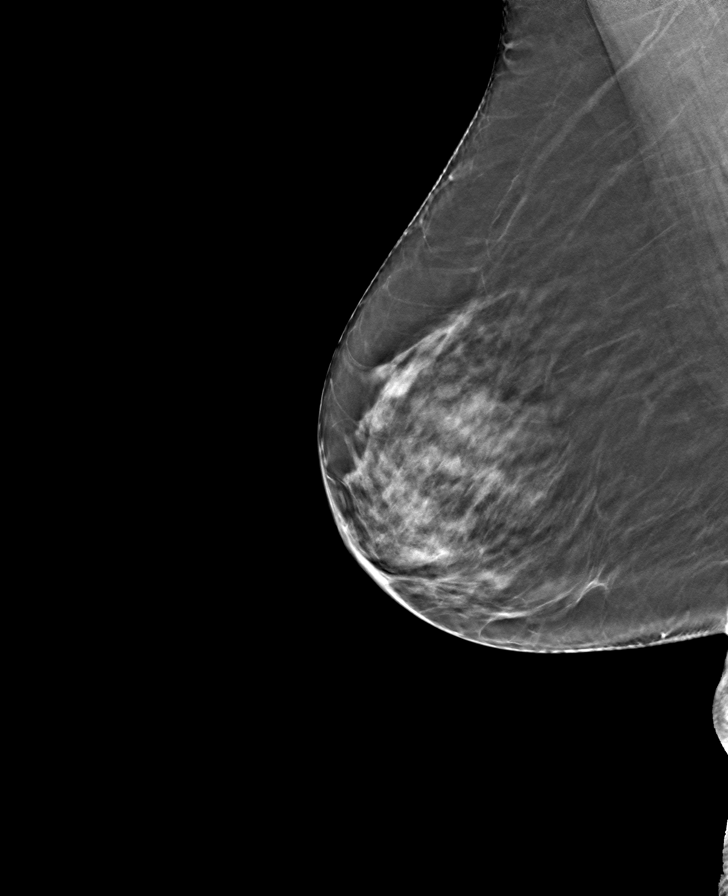

[L CC tomo · tomo slice 31/61.0]
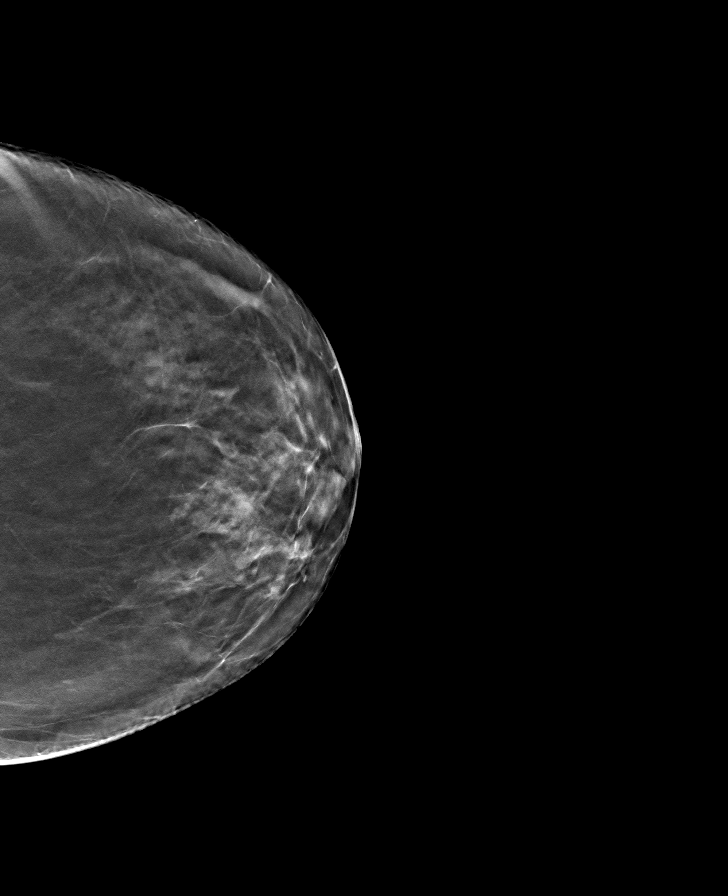

[L MLO tomo · tomo slice 31/61.0]
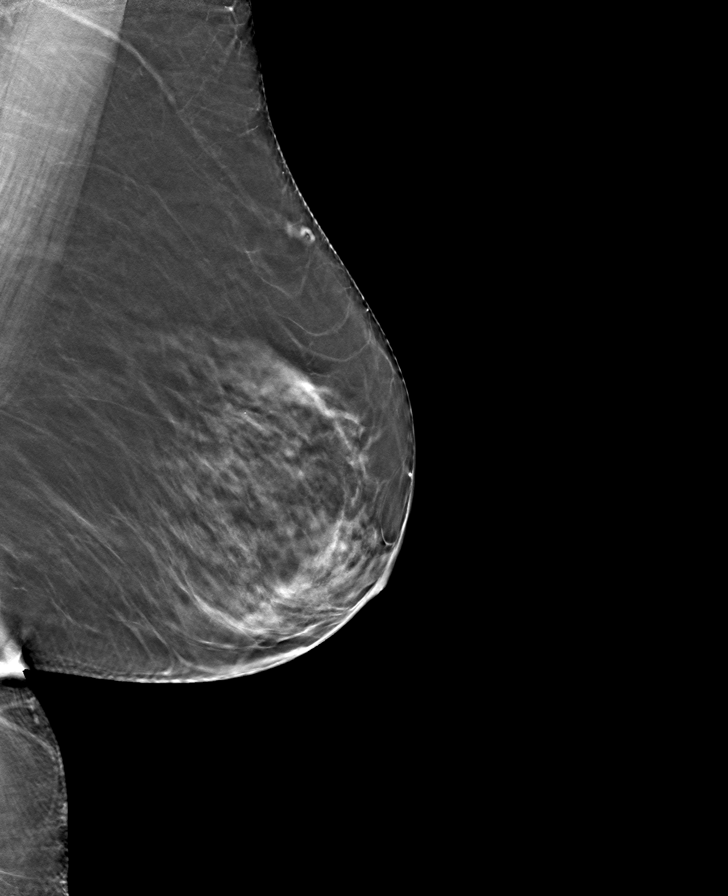

[R CC tomo · tomo slice 29/56.0]
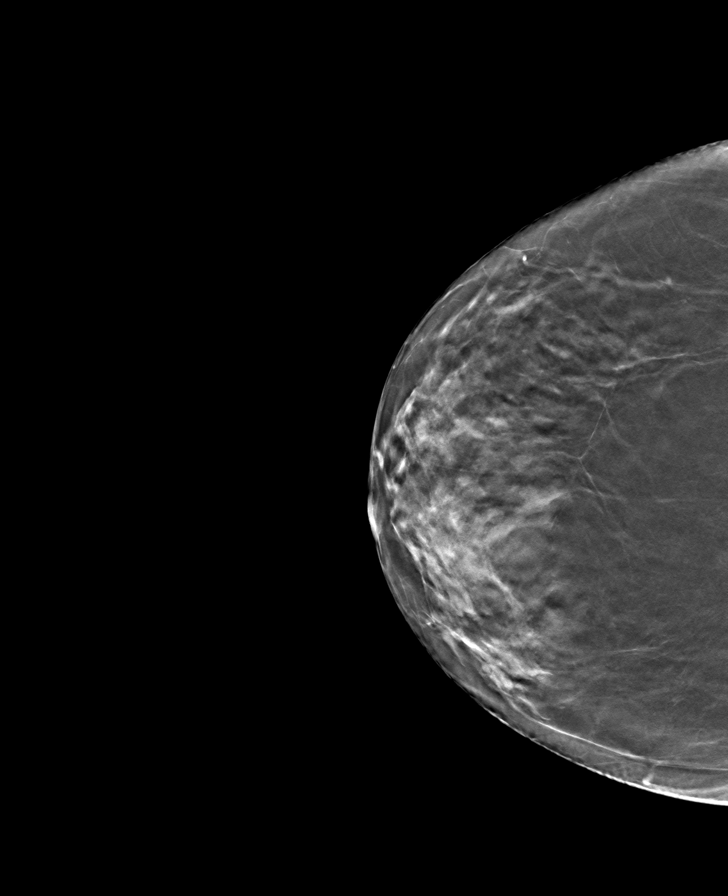

[8 of 24 positions shown; findings below may reference images not displayed]

ACR Breast Density Category b: There are scattered areas of
fibroglandular density.
FINDINGS: There are no findings suspicious for malignancy. Images were
processed with CAD.
IMPRESSION: No mammographic evidence of malignancy. A result letter of this
screening mammogram will be mailed directly to the patient.

RECOMMENDATION:
Screening mammogram in one year. (Code:[id number])

BI-RADS CATEGORY  1: Negative.

## 2021-02-13 IMAGING — US US BIOPSY CORE LIVER
1 series · 9 of 9 positions shown · non-contrast
Comparison: none

INDICATION: 68-year-old female with newly identified lung mass and multifocal
hepatic lesions concerning for metastatic lung cancer. Patient
presents today for ultrasound-guided core biopsy of liver lesion.

[Series 1: us biopsy core liver · 9 of 9 slices shown]
[im 1/9]
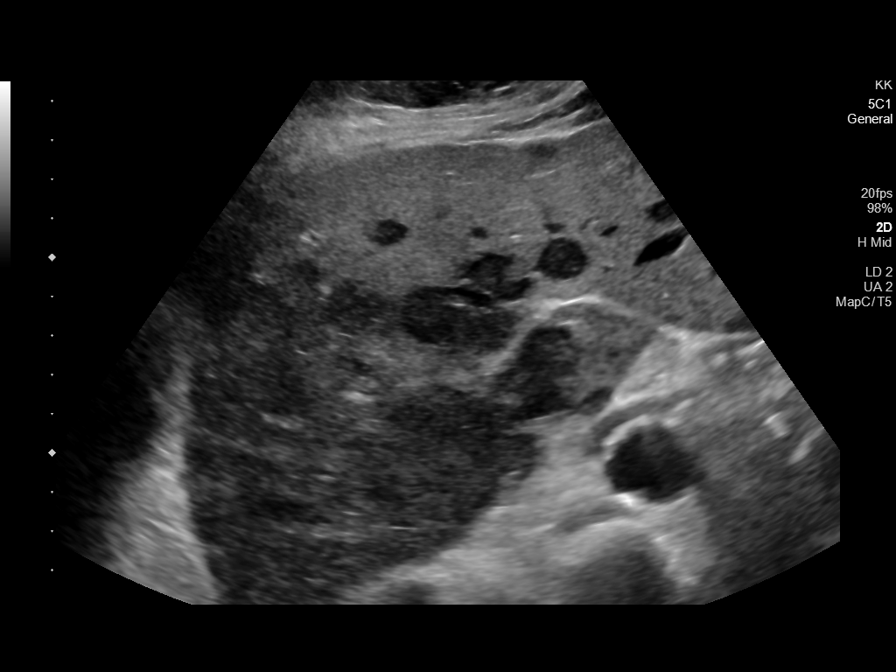
[im 2/9]
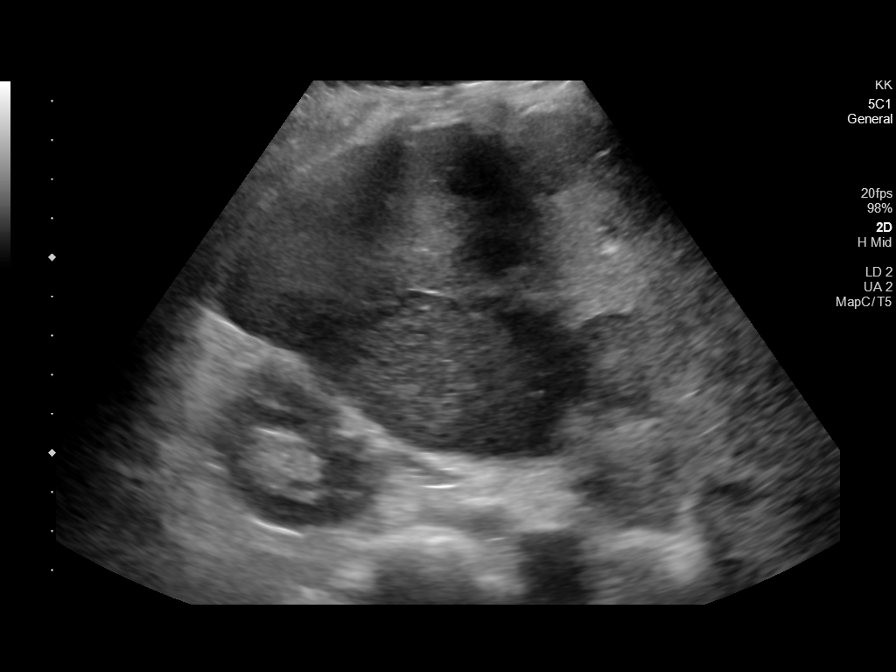
[im 3/9]
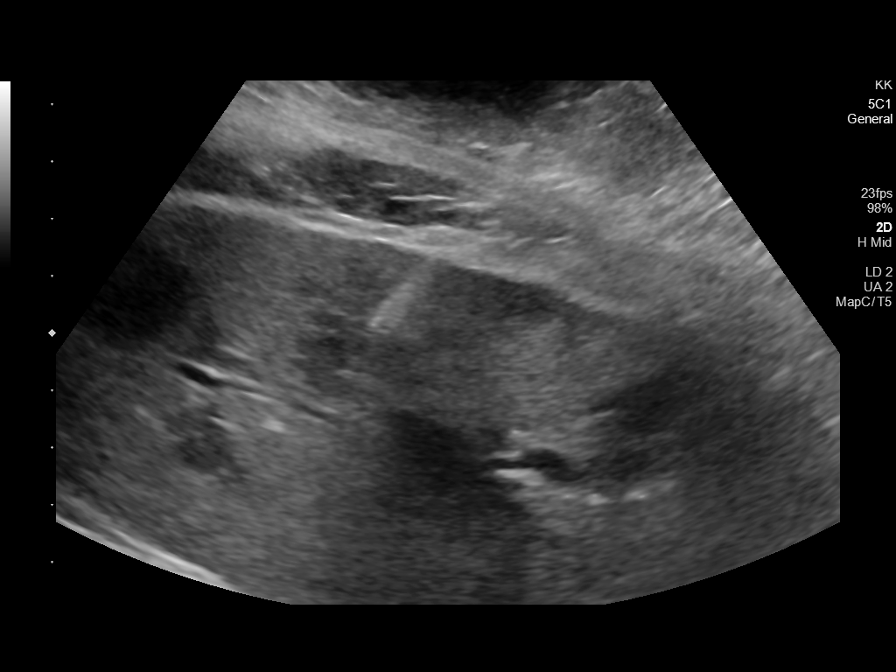
[im 4/9]
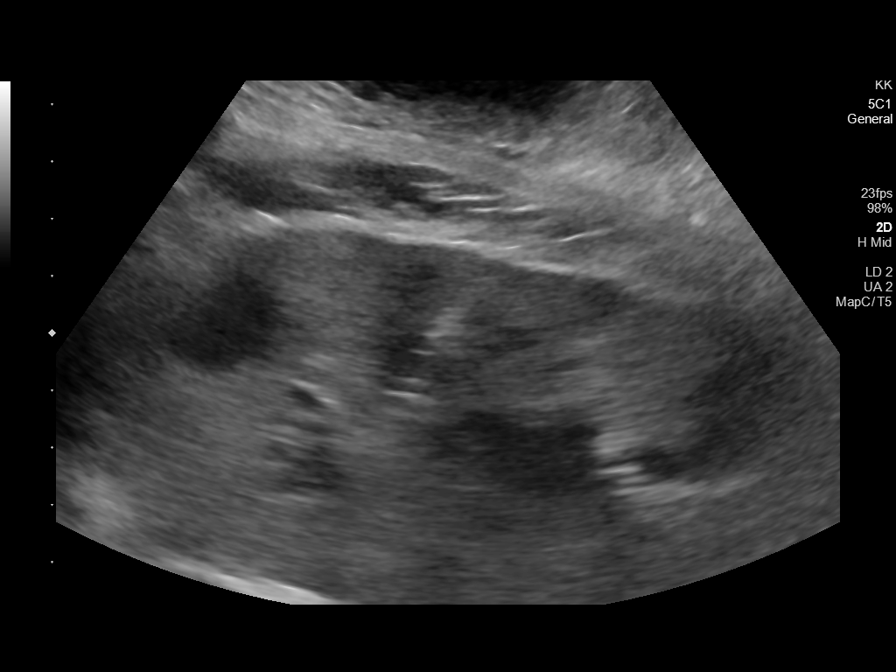
[im 5/9]
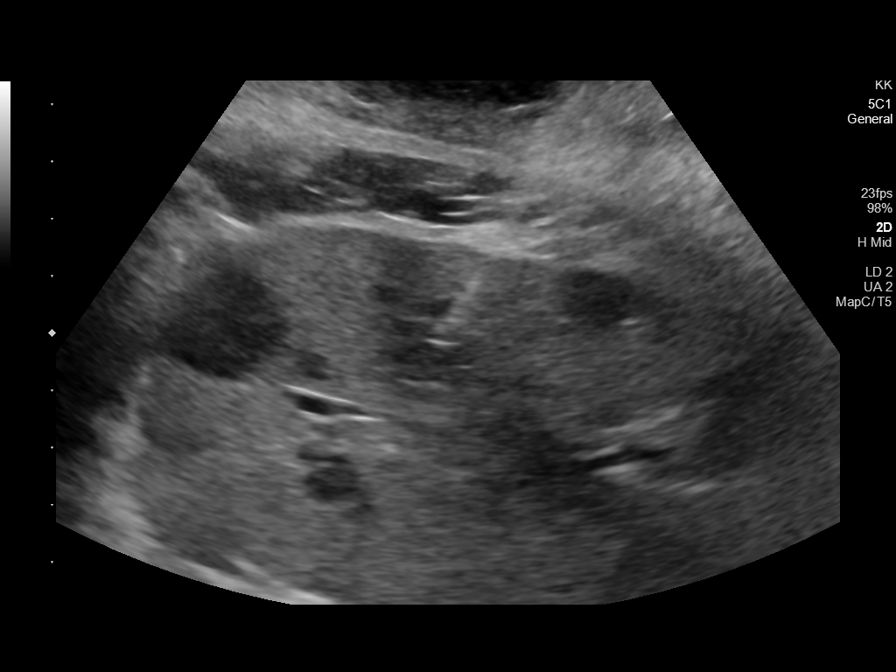
[im 6/9]
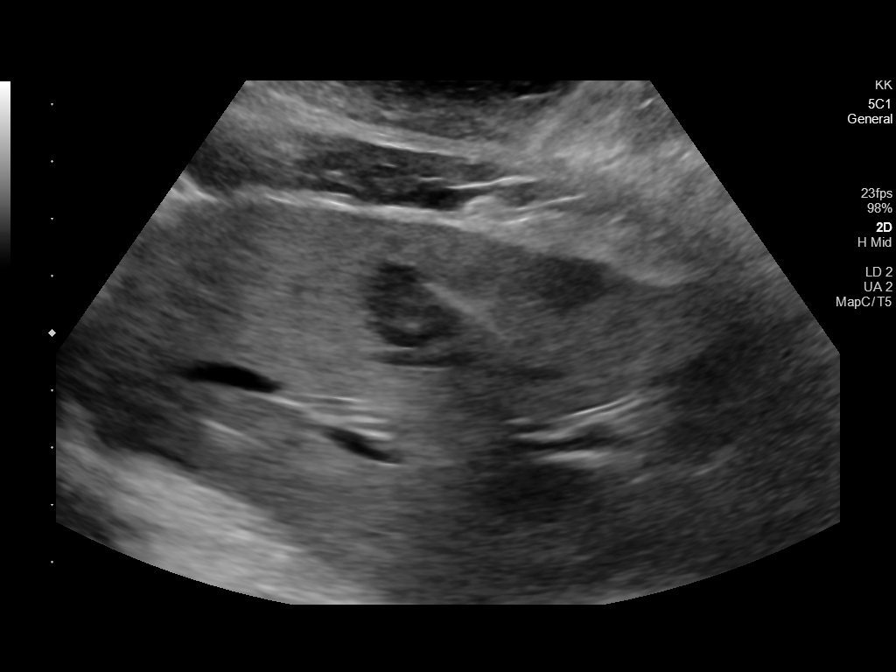
[im 7/9]
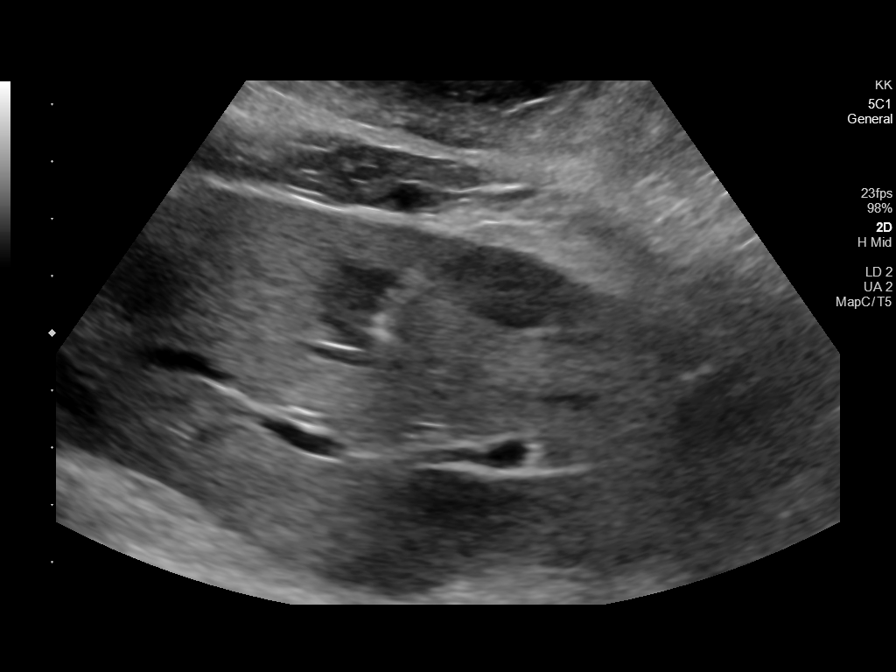
[im 8/9]
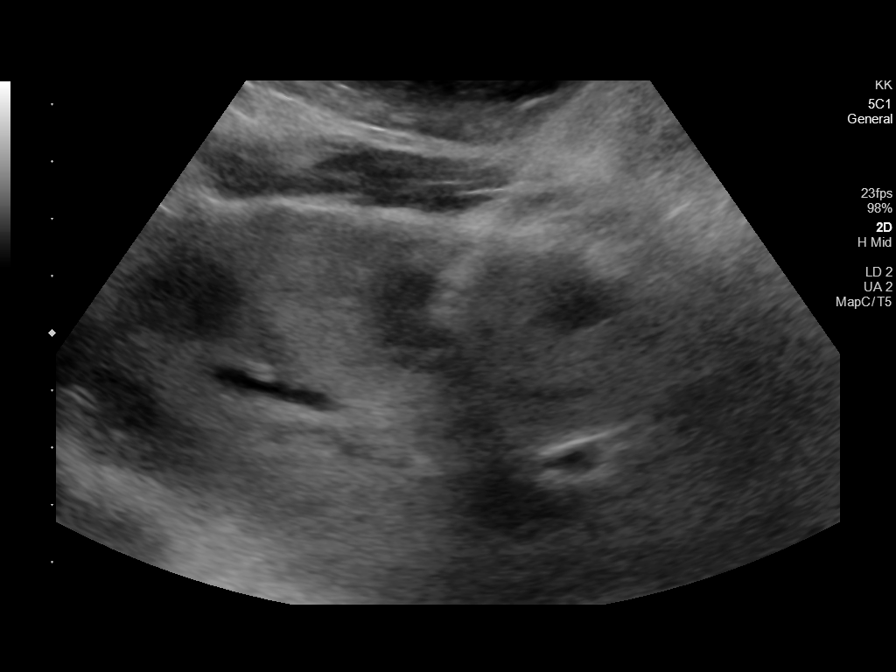
[im 9/9]
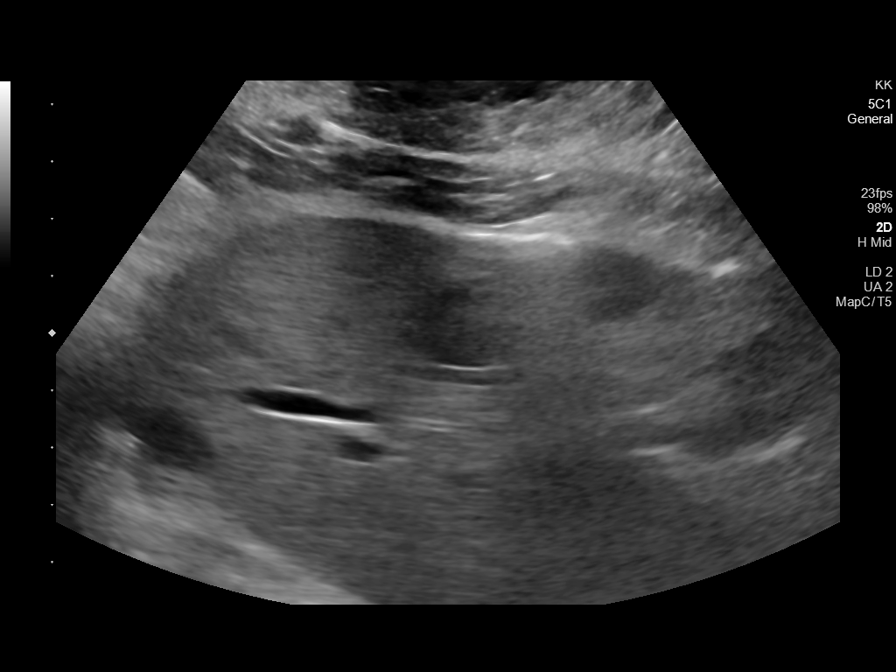

[9 of 9 positions shown; findings below may reference images not displayed]

EXAM:
ULTRASOUND BIOPSY CORE LIVER

MEDICATIONS:
None.

ANESTHESIA/SEDATION:
Moderate (conscious) sedation was employed during this procedure. A
total of Versed 1.5 mg and Fentanyl 100 mcg was administered
intravenously.

Moderate Sedation Time: 10 minutes. The patient's level of
consciousness and vital signs were monitored continuously by
radiology nursing throughout the procedure under my direct
supervision.

FLUOROSCOPY TIME:  None

COMPLICATIONS:
None immediate.

PROCEDURE:
Informed written consent was obtained from the patient after a
thorough discussion of the procedural risks, benefits and
alternatives. All questions were addressed. Maximal Sterile Barrier
Technique was utilized including caps, mask, sterile gowns, sterile
gloves, sterile drape, hand hygiene and skin antiseptic. A timeout
was performed prior to the initiation of the procedure.

The liver was interrogated with ultrasound. There are innumerable
hypoechoic solid masses scattered throughout the liver. A suitable
lesion was identified and a skin entry site marked. The overlying
skin was sterilely prepped and draped in the standard fashion using
chlorhexidine skin prep. Local anesthesia was attained by
infiltration with 1% lidocaine. A small dermatotomy was made. Under
real-time ultrasound guidance, a 17 gauge introducer needle was
carefully advanced through the liver and positioned at the margin of
the mass. Multiple 18 gauge core biopsies were then coaxially
obtained using the [employer] Rifelj automated biopsy device. Biopsy
specimens were placed in formalin and delivered to pathology for
further analysis. As the introducer needle was removed, the biopsy
tract was embolized with a Gel-Foam slurry.

Post biopsy ultrasound imaging demonstrates no evidence of bleeding
or other complication. The patient tolerated the procedure well.
IMPRESSION: Technically successful ultrasound-guided core biopsy of liver
lesion.
